# Patient Record
Sex: Female | Born: 1975 | Race: White | Hispanic: No | Marital: Married | Smoking: Never smoker
Health system: Southern US, Community
[De-identification: ages and names within clinical notes are randomized; demographics above are authoritative.]

## PROBLEM LIST (undated history)

## (undated) DIAGNOSIS — I10 Essential (primary) hypertension: Secondary | ICD-10-CM

## (undated) DIAGNOSIS — T7840XA Allergy, unspecified, initial encounter: Secondary | ICD-10-CM

## (undated) HISTORY — DX: Essential (primary) hypertension: I10

## (undated) HISTORY — PX: HERNIA REPAIR: SHX51

## (undated) HISTORY — PX: CHOLECYSTECTOMY: SHX55

## (undated) HISTORY — DX: Allergy, unspecified, initial encounter: T78.40XA

---
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
## 2006-07-08 ENCOUNTER — Ambulatory Visit: Payer: Self-pay | Admitting: Internal Medicine

## 2006-09-08 ENCOUNTER — Ambulatory Visit: Payer: Self-pay | Admitting: Gastroenterology

## 2006-11-13 ENCOUNTER — Ambulatory Visit: Payer: Self-pay | Admitting: Gastroenterology

## 2007-01-21 ENCOUNTER — Ambulatory Visit: Payer: Self-pay | Admitting: Gastroenterology

## 2007-08-06 ENCOUNTER — Ambulatory Visit: Payer: Self-pay | Admitting: Family Medicine

## 2008-07-07 ENCOUNTER — Ambulatory Visit: Payer: Self-pay | Admitting: Gastroenterology

## 2009-05-15 ENCOUNTER — Ambulatory Visit: Payer: Self-pay | Admitting: Internal Medicine

## 2009-10-23 ENCOUNTER — Ambulatory Visit: Payer: Self-pay | Admitting: Internal Medicine

## 2009-12-26 ENCOUNTER — Ambulatory Visit: Payer: Self-pay | Admitting: Family Medicine

## 2011-03-30 HISTORY — PX: SHOULDER SURGERY: SHX246

## 2011-04-14 ENCOUNTER — Ambulatory Visit: Payer: Self-pay | Admitting: Internal Medicine

## 2011-05-27 ENCOUNTER — Ambulatory Visit: Payer: Self-pay | Admitting: Internal Medicine

## 2012-01-15 ENCOUNTER — Ambulatory Visit: Payer: Self-pay | Admitting: Unknown Physician Specialty

## 2012-01-15 LAB — HCG, QUANTITATIVE, PREGNANCY: Beta Hcg, Quant.: <1 m[IU]/mL — ABNORMAL LOW

## 2012-01-19 ENCOUNTER — Ambulatory Visit: Payer: Self-pay | Admitting: Unknown Physician Specialty

## 2012-03-29 ENCOUNTER — Ambulatory Visit: Payer: Self-pay | Admitting: Unknown Physician Specialty

## 2012-05-17 ENCOUNTER — Ambulatory Visit: Payer: Self-pay | Admitting: Internal Medicine

## 2012-09-07 ENCOUNTER — Ambulatory Visit: Payer: Self-pay | Admitting: Internal Medicine

## 2012-09-13 ENCOUNTER — Telehealth: Payer: Self-pay | Admitting: Internal Medicine

## 2012-09-13 NOTE — Telephone Encounter (Signed)
Patient needing an appointment now her depo shot is over due and something going on with her head.

## 2012-09-14 NOTE — Telephone Encounter (Signed)
I am only going to be here a 1/2 day tomorrow - unable to work in [employer].  I can see her on 09/16/12 at 10:00.  If she is unable to wait - can go to acute care and then I can follow up after.  Thanks.

## 2012-09-14 NOTE — Telephone Encounter (Signed)
See if she can come in at 12:00 today.  I spoke to pt last night and said I would have to find a time.  She said to call her mobile.

## 2012-09-14 NOTE — Telephone Encounter (Signed)
Pt is unavailable can she come tomorrow Any time

## 2012-09-15 ENCOUNTER — Encounter: Payer: Self-pay | Admitting: Internal Medicine

## 2012-09-15 NOTE — Telephone Encounter (Signed)
Pt aware of appointment 

## 2012-09-16 ENCOUNTER — Encounter: Payer: Self-pay | Admitting: Internal Medicine

## 2012-09-16 ENCOUNTER — Telehealth: Payer: Self-pay | Admitting: *Deleted

## 2012-09-16 ENCOUNTER — Ambulatory Visit (INDEPENDENT_AMBULATORY_CARE_PROVIDER_SITE_OTHER): Payer: Commercial Managed Care - PPO | Admitting: Internal Medicine

## 2012-09-16 VITALS — BP 149/91 | HR 88 | Temp 99.1°F | Ht 68.0 in | Wt 294.0 lb

## 2012-09-16 DIAGNOSIS — Z309 Encounter for contraceptive management, unspecified: Secondary | ICD-10-CM

## 2012-09-16 DIAGNOSIS — N959 Unspecified menopausal and perimenopausal disorder: Secondary | ICD-10-CM

## 2012-09-16 DIAGNOSIS — N926 Irregular menstruation, unspecified: Secondary | ICD-10-CM

## 2012-09-16 DIAGNOSIS — Z0000 Encounter for general adult medical examination without abnormal findings: Secondary | ICD-10-CM

## 2012-09-16 DIAGNOSIS — N939 Abnormal uterine and vaginal bleeding, unspecified: Secondary | ICD-10-CM

## 2012-09-16 MED ORDER — SERTRALINE HCL 25 MG PO TABS: 25.0000 mg | ORAL_TABLET | Freq: Every day | ORAL | Status: DC

## 2012-09-16 MED ORDER — TRIAMCINOLONE ACETONIDE 0.1 % EX CREA: TOPICAL_CREAM | Freq: Two times a day (BID) | CUTANEOUS | Status: DC

## 2012-09-16 MED ORDER — AMOXICILLIN 875 MG PO TABS: 875.0000 mg | ORAL_TABLET | Freq: Two times a day (BID) | ORAL | Status: DC

## 2012-09-16 MED ORDER — MEDROXYPROGESTERONE ACETATE 150 MG/ML IM SUSP: 150.0000 mg | Freq: Once | INTRAMUSCULAR | Status: DC

## 2012-09-16 NOTE — Patient Instructions (Addendum)
It was nice seeing you today.  I am sorry you have not been feeling well.  Take the medicine as we discussed and keep me posted with progress.

## 2012-09-21 ENCOUNTER — Telehealth: Payer: Self-pay | Admitting: Internal Medicine

## 2012-09-21 DIAGNOSIS — Z139 Encounter for screening, unspecified: Secondary | ICD-10-CM

## 2012-09-21 DIAGNOSIS — R51 Headache: Secondary | ICD-10-CM

## 2012-09-21 DIAGNOSIS — R5383 Other fatigue: Secondary | ICD-10-CM

## 2012-09-21 NOTE — Telephone Encounter (Signed)
Pt would like a call back

## 2012-09-22 ENCOUNTER — Telehealth: Payer: Self-pay | Admitting: *Deleted

## 2012-09-22 DIAGNOSIS — IMO0001 Reserved for inherently not codable concepts without codable children: Secondary | ICD-10-CM

## 2012-09-22 MED ORDER — MEDROXYPROGESTERONE ACETATE 150 MG/ML IM SUSP: 150.0000 mg | Freq: Once | INTRAMUSCULAR | Status: DC

## 2012-09-22 MED ORDER — ALPRAZOLAM 0.25 MG PO TABS: 0.2500 mg | ORAL_TABLET | Freq: Once | ORAL | Status: DC

## 2012-09-22 NOTE — Telephone Encounter (Signed)
Patient stated that she is agreeable to labs and mri. I told her we would call her back and let her know when to come in.

## 2012-09-22 NOTE — Telephone Encounter (Signed)
I had discussed with her at the last visit that if her headache continued then we would need to schedule her for a mri (brain).  If agreeable let me know and i will schedule.  She will also need to come in for labs this week before mri can be done.

## 2012-09-22 NOTE — Telephone Encounter (Signed)
Head pain and full feeling still bothering her. She has taken 6 days antibiotic. There has been no change in her condition. Patient requests that we call her back today before 5:00 at work or cell number, to let her know what your reply is.

## 2012-09-22 NOTE — Telephone Encounter (Signed)
Please schedule her for an open mri - persistent headaches.  I have ordered a cr to be checked before her test. Thanks.

## 2012-09-22 NOTE — Telephone Encounter (Signed)
Encounter opened in error

## 2012-09-23 ENCOUNTER — Other Ambulatory Visit: Payer: Self-pay | Admitting: *Deleted

## 2012-09-23 NOTE — Telephone Encounter (Signed)
Prescription faxed to pharmacy.

## 2012-09-24 ENCOUNTER — Other Ambulatory Visit (INDEPENDENT_AMBULATORY_CARE_PROVIDER_SITE_OTHER): Payer: Commercial Managed Care - PPO

## 2012-09-24 DIAGNOSIS — Z139 Encounter for screening, unspecified: Secondary | ICD-10-CM

## 2012-09-24 DIAGNOSIS — R5383 Other fatigue: Secondary | ICD-10-CM

## 2012-09-24 DIAGNOSIS — R5381 Other malaise: Secondary | ICD-10-CM

## 2012-09-24 LAB — COMPREHENSIVE METABOLIC PANEL
AST: 21 U/L (ref 0–37)
Albumin: 4.2 g/dL (ref 3.5–5.2)
Alkaline Phosphatase: 59 U/L (ref 39–117)
BUN: 13 mg/dL (ref 6–23)
Potassium: 4.2 mEq/L (ref 3.5–5.1)
Total Bilirubin: 0.3 mg/dL (ref 0.3–1.2)

## 2012-09-24 LAB — LIPID PANEL
Cholesterol: 148 mg/dL (ref 0–200)
HDL: 43.2 mg/dL (ref >39.00)
LDL Cholesterol: 92 mg/dL (ref 0–99)
Triglycerides: 62 mg/dL (ref 0.0–149.0)
VLDL: 12.4 mg/dL (ref 0.0–40.0)

## 2012-09-27 ENCOUNTER — Encounter: Payer: Self-pay | Admitting: Internal Medicine

## 2012-09-27 ENCOUNTER — Telehealth: Payer: Self-pay | Admitting: Internal Medicine

## 2012-09-27 NOTE — Telephone Encounter (Signed)
Pt notified of labs via my chart.  Labs ok. Ok to proceed with mri

## 2012-09-27 NOTE — Progress Notes (Signed)
Subjective:    Patient ID: Erica Hernandez, female    DOB: [birth year], 36 y.o.   MRN: [id number]  HPI 36 year old female who presents as a work in with concerns regarding a persistent headache.  Went to Urgent Care one week ago.  Was diagnosed with a sinus/uri.  Treated with a Zpak.  States for two weeks, she has had a headache.  Involves the top of her head.  Ears uncomfortable.   Left ear - "boil pain".  Minimal nasal congestion now.  Sore throat and some cough. States the headache has been constant for two weeks.  No vision change.  She is off her Zoloft.  Feels she may need to be back on the medication.  No significant depression symptoms.  She does have a localized rash - posterior neck.  Itches.  Appears to be consistent with a contact dermatitis.  No other rash.  No nausea or vomiting.  Eating and drinking well.     Review of Systems Patient denies any lightheadedness or dizziness.  Reports the headache as outlined.   Unsure if related to sinus.  Some minimal nasal congestion and sore throat.  Minimal cough.  Has recently bee treated with abx.  Question if partially treated sinus infection.   No chest pain, tightness or palpitations.  No increased shortness of breath.  No nausea or vomiting.  No abdominal pain or cramping.  No bowel change, such as diarrhea, constipation, BRBPR or melana.  No urine change.        Objective:   Physical Exam Filed Vitals:   09/16/12 1024  BP: 149/91  Pulse: 88  Temp: 99.1 F (37.3 C)   Blood pressure recheck:  36/55  36 year old female in no acute distress.   HEENT:  Nares - clear except slightly erythematous turbinates.  No tenderness to palpation over the temporal artery region.  OP- without lesions or erythema.  PEERL.  Fundi visualized appeared to be benign.  NECK:  Supple, nontender.  No audible bruit.   HEART:  Appears to be regular. LUNGS:  Without crackles or wheezing audible.  Respirations even and unlabored.   RADIAL PULSE:  Equal  bilaterally.  ABDOMEN:  Soft, nontender.  No audible abdominal bruit.   EXTREMITIES:  No increased edema to be present.              SKIN:  Localized rash post neck - appears to be consistent with a contact dermatitis.  No other rash        Assessment & Plan:  HEADACHE.  Unclear as to the exact etiology.  May be partially treated sinus infection. On abx last week.  Will treat with ceftin 250mg  bid.  Saline nasal flushes as directed.  Follow.  Discussed with her regarding further w/up, including MRI (brain) - given persistent and new headache.  She wants to try the abx first and see if headache resolved.  Will let me know if persistent problems.  Will check ESR, CRP and cbc.   INCREASED PSYCHOSOCIAL STRESSORS.  Will get her back on Zoloft.  Restart 25mg  qd.  Follow.  May help above.    RASH.  Appears to be consistent with a contact dermatitis.  Localized to her posterior neck.  Apply triamcinolone cream .1% as directed.  Follow.  Notify me if rash does not resolve.  Do not use the cream in the same area for more than 7-10 days.    ELEVATED BLOOD PRESSURE.  Blood pressure on my  check (with appropriate cuff) - wnl.  Follow.

## 2012-10-08 ENCOUNTER — Telehealth: Payer: Self-pay | Admitting: Internal Medicine

## 2012-10-08 NOTE — Telephone Encounter (Signed)
Called patient to let her know. Patient stated that she would wait and talk to you about on her next appt 10/28/12 9:00 am.

## 2012-10-08 NOTE — Telephone Encounter (Signed)
error 

## 2012-10-08 NOTE — Telephone Encounter (Signed)
Notify pt mri ok.  If persistent headache will need neurology referral.  Let me know and i can order referral.

## 2012-10-21 ENCOUNTER — Encounter: Payer: Self-pay | Admitting: Internal Medicine

## 2012-10-28 ENCOUNTER — Encounter: Payer: Self-pay | Admitting: Internal Medicine

## 2012-10-28 ENCOUNTER — Ambulatory Visit (INDEPENDENT_AMBULATORY_CARE_PROVIDER_SITE_OTHER): Payer: Commercial Managed Care - PPO | Admitting: Internal Medicine

## 2012-10-28 VITALS — BP 138/88 | HR 80 | Temp 98.8°F | Ht 68.0 in | Wt 296.5 lb

## 2012-10-28 DIAGNOSIS — R51 Headache: Secondary | ICD-10-CM

## 2012-10-31 ENCOUNTER — Encounter: Payer: Self-pay | Admitting: Internal Medicine

## 2012-10-31 MED ORDER — SERTRALINE HCL 50 MG PO TABS: 50.0000 mg | ORAL_TABLET | Freq: Every day | ORAL | Status: DC

## 2012-10-31 NOTE — Progress Notes (Signed)
  Subjective:    Patient ID: Erica Hernandez, female    DOB: [birth year], 36 y.o.   MRN: [id number]  HPI 36 year old female who presents for a scheduled follow up with concerns regarding a persistent headache.  Went to Urgent Care initially.  Was diagnosed with a sinus/uri.  Treated with a Zpak.  Saw me in follow up.  Some improvement in some congestion symptoms, but states the headache persisted.  Was then treated with Amoxicillin (to confirm not a partially treated infection).  Not improvement in her headache.  No sinus congestion or drainage.  Describes the sensation as a head fullness.  States since her last visit here, she has had two "bad headaches".  Took her husband's zanaflex and slept with noted improvement.  No vision change.  No nausea or vomiting.  Eating and drinking well.  No neck pain or stiffness.     Review of Systems Patient denies any lightheadedness or dizziness.  Reports the headache as outlined.   No significant sinus or allergy symptoms.  MRI - negative.  No chest pain, tightness or palpitations.  No increased shortness of breath.  No nausea or vomiting.  No abdominal pain or cramping.  No bowel change, such as diarrhea, constipation, BRBPR or melana.          Objective:   Physical Exam  Filed Vitals:   10/28/12 0905  BP: 138/88  Pulse: 80  Temp: 98.8 F (37.1 C)   Blood pressure recheck:  24/65  36 year old female in no acute distress.   HEENT:  Nares - clear.   No tenderness to palpation over the sinuses.  OP- without lesions or erythema.  TMs visualized without erythema.  NECK:  Supple, nontender.  No audible bruit.   HEART:  Appears to be regular. LUNGS:  Without crackles or wheezing audible.  Respirations even and unlabored.   RADIAL PULSE:  Equal bilaterally.  ABDOMEN:  Soft, nontender.  No audible abdominal bruit.   EXTREMITIES:  No increased edema to be present.        Assessment & Plan:  HEADACHE.  Unclear as to the exact etiology.  Previously treated  for a sinus infection.  Made no difference in the headache.  No auras.  No associated nausea or vomiting.  Took  Her husband's zanaflex and slept - with noted improvement.  MRI negative.  Will refer to neurology for evaluation and further treatment recommendations.   GYN.  Receiving Depo shots q 3 months.  Continue.  Continue calcium supplements.    INCREASED PSYCHOSOCIAL STRESSORS.  Back on Zoloft.  Will increase to 50mg  q day.  Follow.   ELEVATED BLOOD PRESSURE.  Blood pressure on my check (with appropriate cuff) - wnl.  Follow.   HEALTH MAINTENANCE.  Physical 11/26/11.  Will set her up for a physical next visit.  Follow.

## 2012-12-06 ENCOUNTER — Ambulatory Visit (INDEPENDENT_AMBULATORY_CARE_PROVIDER_SITE_OTHER): Payer: Commercial Managed Care - PPO | Admitting: Internal Medicine

## 2012-12-06 ENCOUNTER — Other Ambulatory Visit (HOSPITAL_COMMUNITY)
Admission: RE | Admit: 2012-12-06 | Discharge: 2012-12-06 | Disposition: A | Discharge status: Home / Self Care | Payer: Commercial Managed Care - PPO | Source: Ambulatory Visit | Attending: Internal Medicine | Admitting: Internal Medicine

## 2012-12-06 ENCOUNTER — Encounter: Payer: Self-pay | Admitting: Internal Medicine

## 2012-12-06 VITALS — BP 120/70 | HR 85 | Temp 99.0°F | Ht 68.0 in | Wt 290.5 lb

## 2012-12-06 DIAGNOSIS — Z139 Encounter for screening, unspecified: Secondary | ICD-10-CM

## 2012-12-06 DIAGNOSIS — Z309 Encounter for contraceptive management, unspecified: Secondary | ICD-10-CM

## 2012-12-06 DIAGNOSIS — Z01419 Encounter for gynecological examination (general) (routine) without abnormal findings: Secondary | ICD-10-CM | POA: Insufficient documentation

## 2012-12-06 DIAGNOSIS — IMO0001 Reserved for inherently not codable concepts without codable children: Secondary | ICD-10-CM

## 2012-12-06 DIAGNOSIS — Z1151 Encounter for screening for human papillomavirus (HPV): Secondary | ICD-10-CM | POA: Insufficient documentation

## 2012-12-06 MED ORDER — METHYLPREDNISOLONE ACETATE 40 MG/ML IJ SUSP
150.0000 mg | Freq: Once | INTRAMUSCULAR | Status: AC
  Administered 2012-12-06: 150 mg via INTRAMUSCULAR

## 2012-12-06 NOTE — Progress Notes (Signed)
Subjective:    Patient ID: Erica Hernandez, female    DOB: May 25, 1976, 37 y.o.   MRN: [id number]  HPI 37 year old female who comes in today for her complete physical exam.  Still with headaches.  See last note for details.  Had negative MRI.  Saw neurology.  Obtain records.  Wanted to start her on nortriptyline.  She did not and does not want to start this medication.  Some discomfort (minimal) occurring most days.  Steroids made no difference.  abx did not help.  She does grind her teeth.  She is also having some tooth issues.  Plans to follow up with her dentist regarding the above.  She has started back to weight watchers.  Has lost 6 pounds since her last visit.  Doing well on the zoloft.    Past Medical History  Diagnosis Date  . Hypertension     during pregnancy     Current Outpatient Prescriptions on File Prior to Visit  Medication Sig Dispense Refill  . ALPRAZolam (XANAX) 0.25 MG tablet Take 1 tablet (0.25 mg total) by mouth once. Take before mri.  May repeat x 1 if needed  2 tablet  0  . medroxyPROGESTERone (DEPO-PROVERA) 150 MG/ML injection Inject 150 mg into the muscle every 3 (three) months.      . sertraline (ZOLOFT) 50 MG tablet Take 1 tablet (50 mg total) by mouth daily.  30 tablet  3  . triamcinolone cream (KENALOG) 0.1 % Apply topically 2 (two) times daily.  30 g  0    Review of Systems Patient denies any lightheadedness or dizziness.  Reports the headache as outlined.   No significant sinus or allergy symptoms.  MRI - negative.  No chest pain, tightness or palpitations.  No increased shortness of breath.  No nausea or vomiting.  No abdominal pain or cramping.  No bowel change, such as diarrhea, constipation, BRBPR or melana.          Objective:   Physical Exam  Filed Vitals:   12/06/12 1016  BP: 120/70  Pulse: 85  Temp: 99 F (49.52 C)   37 year old female in no acute distress.   HEENT:  Nares- clear.  Oropharynx - without lesions. NECK:  Supple.  Nontender.  No  audible bruit.  HEART:  Appears to be regular. LUNGS:  No crackles or wheezing audible.  Respirations even and unlabored.  RADIAL PULSE:  Equal bilaterally.    BREASTS:  No nipple discharge or nipple retraction present.  Could not appreciate any distinct nodules or axillary adenopathy.  ABDOMEN:  Soft, nontender.  Bowel sounds present and normal.  No audible abdominal bruit.  GU:  Normal external genitalia.  Vaginal vault without lesions.  Cervix identified.  Pap performed. Could not appreciate any adnexal masses or tenderness.   EXTREMITIES:  No increased edema present.  DP pulses palpable and equal bilaterally.         Assessment & Plan:  HEADACHE.  Unclear as to the exact etiology.  Previously treated for a sinus infection.  Made no difference in the headache.  No auras.  No associated nausea or vomiting.  Took  Her husband's zanaflex and slept - with noted improvement.  MRI negative.  Saw neurology.  Obtain records.  See above.  Desires not to take the nortriptyline.  Discussed referral back to neurology.  Discussed keeping appt with the dentist and treating her teeth as well as a possible bite block.  She desires no further  intervention at this time.  Follow.    GYN.  Receiving Depo shots q 3 months.  Continue.  Continue calcium supplements.    INCREASED PSYCHOSOCIAL STRESSORS.  On zoloft 50mg  q day.  Doing well.  Follow.   ELEVATED BLOOD PRESSURE.  Blood pressure today - wnl.    HEALTH MAINTENANCE.  Physical today.  Pap today.

## 2012-12-07 ENCOUNTER — Encounter: Payer: Self-pay | Admitting: Internal Medicine

## 2012-12-08 ENCOUNTER — Encounter: Payer: Self-pay | Admitting: *Deleted

## 2012-12-25 ENCOUNTER — Other Ambulatory Visit: Payer: Self-pay

## 2013-01-02 ENCOUNTER — Ambulatory Visit: Payer: Self-pay | Admitting: Emergency Medicine

## 2013-01-02 LAB — RAPID STREP-A WITH REFLX: Micro Text Report: NEGATIVE

## 2013-01-04 LAB — BETA STREP CULTURE(ARMC)

## 2013-02-07 ENCOUNTER — Ambulatory Visit: Payer: Self-pay | Admitting: Emergency Medicine

## 2013-02-07 LAB — URINALYSIS, COMPLETE
Bacteria: NEGATIVE
Bilirubin,UR: NEGATIVE
Glucose,UR: NEGATIVE mg/dL (ref 0–75)
Ketone: NEGATIVE
Leukocyte Esterase: NEGATIVE
Nitrite: NEGATIVE
Ph: 7 (ref 4.5–8.0)
Protein: NEGATIVE
RBC,UR: NONE SEEN /HPF (ref 0–5)
Specific Gravity: 1.01 (ref 1.003–1.030)

## 2013-02-07 LAB — PREGNANCY, URINE: Pregnancy Test, Urine: NEGATIVE m[IU]/mL

## 2013-03-01 ENCOUNTER — Other Ambulatory Visit: Payer: Self-pay | Admitting: *Deleted

## 2013-03-02 ENCOUNTER — Ambulatory Visit (INDEPENDENT_AMBULATORY_CARE_PROVIDER_SITE_OTHER): Payer: Commercial Managed Care - PPO | Admitting: Internal Medicine

## 2013-03-02 ENCOUNTER — Encounter: Payer: Self-pay | Admitting: Internal Medicine

## 2013-03-02 VITALS — BP 120/70 | HR 80 | Temp 98.7°F | Ht 68.0 in | Wt 309.0 lb

## 2013-03-02 DIAGNOSIS — IMO0001 Reserved for inherently not codable concepts without codable children: Secondary | ICD-10-CM

## 2013-03-02 DIAGNOSIS — Z309 Encounter for contraceptive management, unspecified: Secondary | ICD-10-CM

## 2013-03-02 MED ORDER — MEDROXYPROGESTERONE ACETATE 150 MG/ML IM SUSP
150.0000 mg | Freq: Once | INTRAMUSCULAR | Status: AC
  Administered 2013-03-02: 150 mg via INTRAMUSCULAR

## 2013-03-06 ENCOUNTER — Encounter: Payer: Self-pay | Admitting: Internal Medicine

## 2013-03-06 NOTE — Progress Notes (Signed)
  Subjective:    Patient ID: Erica Hernandez, female    DOB: [birth year], 37 y.o.   MRN: [id number]  HPI 37 year old female who comes in today for a scheduled follow up.  States that on 02/07/13 got up and was experiencing chest and arm discomfort.  Went to urgent care.  Diagnosed with costochondritis.  Has been taking mobic.  Has been much improved.  Notices a little flare this am.  No sob. No pain with deep breathing.  No acid reflux.  No abdominal pain.  Bowels stable.  Doing well on the zoloft.  Handling stress well.     Past Medical History  Diagnosis Date  . Hypertension     during pregnancy     Current Outpatient Prescriptions on File Prior to Visit  Medication Sig Dispense Refill  . medroxyPROGESTERone (DEPO-PROVERA) 150 MG/ML injection Inject 150 mg into the muscle every 3 (three) months.      . sertraline (ZOLOFT) 50 MG tablet Take 1 tablet (50 mg total) by mouth daily.  30 tablet  3  . triamcinolone cream (KENALOG) 0.1 % Apply topically 2 (two) times daily.  30 g  0   No current facility-administered medications on file prior to visit.    Review of Systems Patient denies any lightheadedness or dizziness.  Previously worked up for headache.  MRI negative.  Appears to be better.  No significant sinus or allergy symptoms.  Some check discomfort.  Reproducible to palpation.  No sob.  No palpitations.  No nausea or vomiting.  No acid reflux.  No abdominal pain or cramping.  No bowel change, such as diarrhea, constipation, BRBPR or melana.  Handling stress well.         Objective:   Physical Exam  Filed Vitals:   03/02/13 0804  BP: 120/70  Pulse: 80  Temp: 98.7 F (37.1 C)   Blood pressure recheck:  122/82, pulse 52  37 year old female in no acute distress.   HEENT:  Nares- clear.  Oropharynx - without lesions. NECK:  Supple.  Nontender.  No audible bruit.  HEART:  Appears to be regular. LUNGS:  No crackles or wheezing audible.  Respirations even and unlabored.  RADIAL  PULSE:  Equal bilaterally.    CHEST WALL.  Pain to palpation over the sternum.  (reproducible pain).   ABDOMEN:  Soft, nontender.  Bowel sounds present and normal.  No audible abdominal bruit.  EXTREMITIES:  No increased edema present.       Assessment & Plan:  CHEST PAIN.  Reproducible on exam.  Diagnosed with costochondritis.  On mobic.  Improved.  Follow.  Notify me if persistent.   HEADACHE.  Worked up.  Improved.  Follow.    GYN.  Receiving Depo shots q 3 months.  Continue.  Continue calcium supplements.    INCREASED PSYCHOSOCIAL STRESSORS.  On zoloft 50mg  q day.  Doing well.  Follow.   ELEVATED BLOOD PRESSURE.  Blood pressure today - wnl.    HEALTH MAINTENANCE.  Physical last visit.

## 2013-03-07 ENCOUNTER — Ambulatory Visit: Payer: Commercial Managed Care - PPO | Admitting: Internal Medicine

## 2013-04-08 ENCOUNTER — Encounter: Payer: Self-pay | Admitting: Internal Medicine

## 2013-05-24 ENCOUNTER — Ambulatory Visit (INDEPENDENT_AMBULATORY_CARE_PROVIDER_SITE_OTHER): Payer: Commercial Managed Care - PPO | Admitting: Internal Medicine

## 2013-05-24 ENCOUNTER — Encounter: Payer: Self-pay | Admitting: Internal Medicine

## 2013-05-24 VITALS — BP 120/80 | HR 72 | Temp 98.9°F | Ht 68.0 in | Wt 302.2 lb

## 2013-05-24 DIAGNOSIS — R5381 Other malaise: Secondary | ICD-10-CM

## 2013-05-24 DIAGNOSIS — R5383 Other fatigue: Secondary | ICD-10-CM

## 2013-05-24 DIAGNOSIS — Z309 Encounter for contraceptive management, unspecified: Secondary | ICD-10-CM

## 2013-05-24 LAB — CBC WITH DIFFERENTIAL/PLATELET
Eosinophils Relative: 2.2 % (ref 0.0–5.0)
HCT: 39.2 % (ref 36.0–46.0)
Hemoglobin: 13.2 g/dL (ref 12.0–15.0)
Lymphs Abs: 2.1 10*3/uL (ref 0.7–4.0)
MCV: 87.3 fl (ref 78.0–100.0)
Monocytes Absolute: 0.4 10*3/uL (ref 0.1–1.0)
Monocytes Relative: 5.5 % (ref 3.0–12.0)
Neutro Abs: 4.5 10*3/uL (ref 1.4–7.7)
Neutrophils Relative %: 63 % (ref 43.0–77.0)
RDW: 15 % — ABNORMAL HIGH (ref 11.5–14.6)
WBC: 7.2 10*3/uL (ref 4.5–10.5)

## 2013-05-24 LAB — COMPREHENSIVE METABOLIC PANEL
CO2: 26 mEq/L (ref 19–32)
Calcium: 9.3 mg/dL (ref 8.4–10.5)
Chloride: 108 mEq/L (ref 96–112)
Creatinine, Ser: 0.9 mg/dL (ref 0.4–1.2)
GFR: 72.89 mL/min (ref >60.00)
Glucose, Bld: 96 mg/dL (ref 70–99)
Total Bilirubin: 0.5 mg/dL (ref 0.3–1.2)
Total Protein: 7.3 g/dL (ref 6.0–8.3)

## 2013-05-24 LAB — TSH: TSH: 1.49 u[IU]/mL (ref 0.35–5.50)

## 2013-05-24 MED ORDER — SERTRALINE HCL 50 MG PO TABS: ORAL_TABLET | ORAL | Status: DC

## 2013-05-24 MED ORDER — MEDROXYPROGESTERONE ACETATE 150 MG/ML IM SUSP
150.0000 mg | Freq: Once | INTRAMUSCULAR | Status: AC
  Administered 2013-05-24: 150 mg via INTRAMUSCULAR

## 2013-05-26 NOTE — Telephone Encounter (Signed)
Mailed unread message to pt  

## 2013-05-28 ENCOUNTER — Encounter: Payer: Self-pay | Admitting: Internal Medicine

## 2013-05-28 NOTE — Progress Notes (Signed)
  Subjective:    Patient ID: Erica Hernandez, female    DOB: 1976/06/17, 37 y.o.   MRN: [id number]  HPI 37 year old female who comes in today for a scheduled follow up.  She states she is doing relatively well.  On zoloft.  Feels she may need to increase the dose.  Increased stress at work and with some family issues.  She also reports noticing some rectal bleeding - with a bowel movement.  Occasionally notices with straining.  Will feel a sharp pain with bowel movement at times.  She has started weight watchers.  Drinking more water.  Has been using some preparation H wipes.  Has some occasional diarrhea.  No abdominal pain.  Has a maternal uncle with Crohns.  No other family members with colon issues.      Past Medical History  Diagnosis Date  . Hypertension     during pregnancy     Current Outpatient Prescriptions on File Prior to Visit  Medication Sig Dispense Refill  . medroxyPROGESTERone (DEPO-PROVERA) 150 MG/ML injection Inject 150 mg into the muscle every 3 (three) months.      . meloxicam (MOBIC) 15 MG tablet Take 15 mg by mouth as needed.       . triamcinolone cream (KENALOG) 0.1 % Apply topically 2 (two) times daily.  30 g  0   No current facility-administered medications on file prior to visit.    Review of Systems Patient denies any lightheadedness or dizziness.  Previously worked up for headache.  MRI negative.  Appears to be better.  No significant sinus or allergy symptoms.  No chest pain or tightness with palpation.   No sob.  No palpitations.  No nausea or vomiting.  No acid reflux.  No abdominal pain or cramping.  Some occasional diarrhea.  Rectal bleeding as outlined.  Will notice on the tissue and in the toilet.         Objective:   Physical Exam  Filed Vitals:   05/24/13 0759  BP: 120/80  Pulse: 72  Temp: 98.9 F (45.4 C)   37 year old female in no acute distress.   HEENT:  Nares- clear.  Oropharynx - without lesions. NECK:  Supple.  Nontender.  No audible  bruit.  HEART:  Appears to be regular. LUNGS:  No crackles or wheezing audible.  Respirations even and unlabored.  RADIAL PULSE:  Equal bilaterally.     ABDOMEN:  Soft, nontender.  Bowel sounds present and normal.  No audible abdominal bruit.  RECTAL:  No external hemorrhoids noted.  No mass or lesion felt on rectal exam.  Heme negative.  EXTREMITIES:  No increased edema present.       Assessment & Plan:  RECTAL BLEEDING.  Exam as outlined.  Annusol HC suppositories as directed.  Follow.  Get her back in soon to reassess.  Check cbc.   FATIGUE.  Check cbc, met c and tsh.   HEADACHE.  Worked up.  Improved.  Follow.    GYN.  Receiving Depo shots q 3 months.  Continue.  Continue calcium supplements.    INCREASED PSYCHOSOCIAL STRESSORS.  On zoloft 50mg  q day.  Increased stress.  Feels needs to increase the dose.  Will increase to 50mg  1 1/2 tablet q day.  Follow.  Get her back in soon to reassess.    ELEVATED BLOOD PRESSURE.  Blood pressure today - wnl.    HEALTH MAINTENANCE.  Physical 12/06/12.  Cholesterol panel 09/24/12 wnl.

## 2013-06-30 ENCOUNTER — Ambulatory Visit: Payer: Commercial Managed Care - PPO | Admitting: Internal Medicine

## 2013-07-01 ENCOUNTER — Ambulatory Visit: Payer: Self-pay | Admitting: Emergency Medicine

## 2013-07-04 ENCOUNTER — Encounter: Payer: Self-pay | Admitting: Internal Medicine

## 2013-07-04 ENCOUNTER — Ambulatory Visit (INDEPENDENT_AMBULATORY_CARE_PROVIDER_SITE_OTHER): Payer: Commercial Managed Care - PPO | Admitting: Internal Medicine

## 2013-07-04 VITALS — BP 110/70 | HR 89 | Temp 98.6°F | Ht 68.0 in | Wt 302.0 lb

## 2013-07-04 DIAGNOSIS — R51 Headache: Secondary | ICD-10-CM

## 2013-07-04 MED ORDER — AMOXICILLIN 875 MG PO TABS: 875.0000 mg | ORAL_TABLET | Freq: Two times a day (BID) | ORAL | Status: DC

## 2013-07-04 NOTE — Patient Instructions (Addendum)
Saline nasal spray - flush nose at least 2-3x/day.  Continue the Flonase.  Mucinex in the am and Robitussin in the evening.

## 2013-07-05 ENCOUNTER — Encounter: Payer: Self-pay | Admitting: Internal Medicine

## 2013-07-05 NOTE — Progress Notes (Signed)
  Subjective:    Patient ID: Erica Hernandez, female    DOB: [birth year], 37 y.o.   MRN: [id number]  Sinusitis  37 year old female who comes in today as a work in with concerns regarding some increased head pressure.  She states symptoms started 10 days ago.  Has been having some head pressure.  Has noticed this previously.  Had extensive w/up including MRI and neurology evaluation.  No etiology found.  Pressure resolved.  Returned as outlined.  States mostly localized to the top of her head and frontal region.  States it feels like a sinus infection and she needs to "blow congestion out of her head".  No severe pain.  No neck stiffness.  Low grade fever (99's - max).  Has been taking sudafed and zyrtec.  Used flonase for several days.  No nausea or vomiting.  Eating and drinking well.      Past Medical History  Diagnosis Date  . Hypertension     during pregnancy     Current Outpatient Prescriptions on File Prior to Visit  Medication Sig Dispense Refill  . medroxyPROGESTERone (DEPO-PROVERA) 150 MG/ML injection Inject 150 mg into the muscle every 3 (three) months.      . meloxicam (MOBIC) 15 MG tablet Take 15 mg by mouth as needed.       . sertraline (ZOLOFT) 50 MG tablet Take 1 1/2 tablet q day  45 tablet  3  . triamcinolone cream (KENALOG) 0.1 % Apply topically 2 (two) times daily.  30 g  0   No current facility-administered medications on file prior to visit.    Review of Systems Patient denies any lightheadedness or dizziness.  Previously worked up for headache.  MRI negative.  Now with some head pressure.  Symptoms as outlined.  Low grade fever.  No neck stiffness.  No nausea or vomiting.  Eating and drinking well.  No bowel change.        Objective:   Physical Exam  Filed Vitals:   07/04/13 1632  BP: 110/70  Pulse: 89  Temp: 98.6 F (70 C)   37 year old female in no acute distress.   HEENT:  Nares- clear except for slightly erythematous turbinates.  Oropharynx - without  lesions.  TMs - clear, no erythema.  Minimal tenderness to palpation over the sinuses.  NECK:  Supple.  Nontender.   HEART:  Appears to be regular. LUNGS:  No crackles or wheezing audible.  Respirations even and unlabored.     Assessment & Plan:  HEAD PRESSURE.  Symptoms and exam as outlined.  Had previous MRI and neurology w/up.  She states it feels like a sinus infection.  Given persistent symptoms and no response to sudafed and zyrtec, will treat with amoxicillin 875mg  bid x 10 days.  Stop the zyrtec and sudafed.  Robitussin/mucinex as directed.  Continue flonase.  Use saline nasal spray as directed.  Discussed if symptoms change, worsen or did not resolve - she was to be reevaluated.  Discussed further w/up now.  Wants to try the above treatment measures first.     GYN.  Receiving Depo shots q 3 months.  Continue.  Continue calcium supplements.      ELEVATED BLOOD PRESSURE.  Blood pressure today - wnl.    HEALTH MAINTENANCE.  Physical 12/06/12.  Cholesterol panel 09/24/12 wnl.

## 2013-07-13 ENCOUNTER — Ambulatory Visit (INDEPENDENT_AMBULATORY_CARE_PROVIDER_SITE_OTHER): Payer: Commercial Managed Care - PPO | Admitting: Internal Medicine

## 2013-07-13 ENCOUNTER — Encounter: Payer: Self-pay | Admitting: Internal Medicine

## 2013-07-13 VITALS — BP 120/84 | HR 67 | Temp 98.3°F | Ht 68.0 in | Wt 302.5 lb

## 2013-07-13 DIAGNOSIS — R51 Headache: Secondary | ICD-10-CM

## 2013-07-13 MED ORDER — LEVOFLOXACIN 500 MG PO TABS: 500.0000 mg | ORAL_TABLET | Freq: Every day | ORAL | Status: DC

## 2013-07-16 ENCOUNTER — Encounter: Payer: Self-pay | Admitting: Internal Medicine

## 2013-07-16 NOTE — Progress Notes (Signed)
Subjective:    Patient ID: Erica Hernandez, female    DOB: [birth year], 37 y.o.   MRN: [id number]  HPI 37 year old female who comes in today for a scheduled follow up.  She was seen here on 07/04/13 for headache and ear fullness.  See that note for details.  Was placed on antibiotics.  Unclear if the etiology was sinus infection.  She states that her symptoms have improved some.  Still with some headache.  Also describes some left ear discomfort.  Feels better today.  Previously noted a rash on right side of her throat.  No sore throat.  Some maxillary sinus tenderness.  No nausea or vomiting.  Eating and drinking well.  Bowels stable.  Feels she is handling stress relatively well.      Past Medical History  Diagnosis Date  . Hypertension     during pregnancy     Current Outpatient Prescriptions on File Prior to Visit  Medication Sig Dispense Refill  . amoxicillin (AMOXIL) 875 MG tablet Take 1 tablet (875 mg total) by mouth 2 (two) times daily.  20 tablet  0  . cetirizine (ZYRTEC) 10 MG tablet Take 10 mg by mouth daily.      . fluticasone (FLONASE) 50 MCG/ACT nasal spray Place 2 sprays into the nose daily.      . medroxyPROGESTERone (DEPO-PROVERA) 150 MG/ML injection Inject 150 mg into the muscle every 3 (three) months.      . meloxicam (MOBIC) 15 MG tablet Take 15 mg by mouth as needed.       . sertraline (ZOLOFT) 50 MG tablet Take 1 1/2 tablet q day  45 tablet  3  . triamcinolone cream (KENALOG) 0.1 % Apply topically 2 (two) times daily.  30 g  0   No current facility-administered medications on file prior to visit.    Review of Systems Patient denies any lightheadedness or dizziness.  Does report headache as outlined.  Previously worked up for headache.  MRI negative.  Saw neurology.  Headaches subsided for a while, but now returned.  Left ear fullness as outlined.  No sore throat.   No chest pain, tightness or palpitations.   No sob.  No nausea or vomiting.  No acid reflux.  No abdominal  pain or cramping.  Bowels stable.           Objective:   Physical Exam  Filed Vitals:   07/13/13 1004  BP: 120/84  Pulse: 67  Temp: 98.3 F (36.8 C)   Blood pressure recheck:  118/72, pulse 30  37 year old female in no acute distress.   HEENT:  Nares- clear.  Oropharynx - without lesions. NECK:  Supple.  Nontender.  No audible bruit.  HEART:  Appears to be regular. LUNGS:  No crackles or wheezing audible.  Respirations even and unlabored.  RADIAL PULSE:  Equal bilaterally.     ABDOMEN:  Soft, nontender.  Bowel sounds present and normal.  No audible abdominal bruit.  RECTAL:  No external hemorrhoids noted.  No mass or lesion felt on rectal exam.  Heme negative.  EXTREMITIES:  No increased edema present.       Assessment & Plan:  HEADACHE.  Being treated for sinus infection with amoxicillin.  Feels some better, but having persistent symptoms.  Will extend abx, but change to levaquin 500mg  q day.  Saline nasal spray as outlined.  Follow.  Notify me if symptoms persist.  Has had extensive w/up for headaches previously.  GYN.  Receiving Depo shots q 3 months.  Continue.  Continue calcium supplements.    INCREASED PSYCHOSOCIAL STRESSORS.  On zoloft.  Doing well.  Follow.     ELEVATED BLOOD PRESSURE.  Blood pressure today - wnl.    HEALTH MAINTENANCE.  Physical 12/06/12.  Cholesterol panel 09/24/12 wnl.

## 2013-07-21 ENCOUNTER — Telehealth: Payer: Self-pay | Admitting: Internal Medicine

## 2013-07-21 DIAGNOSIS — R51 Headache: Secondary | ICD-10-CM

## 2013-07-21 NOTE — Telephone Encounter (Signed)
Notified pt. She states Dr. Lorin Picket had mentioned maybe seeing an ENT for her symptoms, wants to know if that would be an option. Does not want to see Dr. Malvin Johns again.

## 2013-07-21 NOTE — Telephone Encounter (Signed)
Pt is needing a call back. She says her head is not any better and she thinks it's time to do something else.

## 2013-07-21 NOTE — Telephone Encounter (Signed)
If having persistent headache - I would like to refer her back to neurology or headache clinic.  If she is agreeable to f/u with Dr Malvin Johns - can have Erica Hernandez call and schedule f/u appt soon.  Can refer to headache clinic or another neurology if needed - may take a while.

## 2013-07-21 NOTE — Telephone Encounter (Signed)
Order placed for ENT referral.   

## 2013-08-18 ENCOUNTER — Encounter: Payer: Self-pay | Admitting: Internal Medicine

## 2013-08-18 ENCOUNTER — Ambulatory Visit (INDEPENDENT_AMBULATORY_CARE_PROVIDER_SITE_OTHER): Payer: Commercial Managed Care - PPO | Admitting: Internal Medicine

## 2013-08-18 ENCOUNTER — Ambulatory Visit: Payer: Commercial Managed Care - PPO

## 2013-08-18 VITALS — BP 120/82 | HR 89 | Temp 98.1°F | Ht 68.0 in | Wt 312.5 lb

## 2013-08-18 DIAGNOSIS — IMO0001 Reserved for inherently not codable concepts without codable children: Secondary | ICD-10-CM

## 2013-08-18 DIAGNOSIS — Z309 Encounter for contraceptive management, unspecified: Secondary | ICD-10-CM

## 2013-08-18 DIAGNOSIS — Z23 Encounter for immunization: Secondary | ICD-10-CM

## 2013-08-18 MED ORDER — MEDROXYPROGESTERONE ACETATE 150 MG/ML IM SUSP
150.0000 mg | Freq: Once | INTRAMUSCULAR | Status: AC
  Administered 2013-08-18: 150 mg via INTRAMUSCULAR

## 2013-08-21 ENCOUNTER — Encounter: Payer: Self-pay | Admitting: Internal Medicine

## 2013-08-21 NOTE — Progress Notes (Signed)
  Subjective:    Patient ID: Erica Hernandez, female    DOB: [birth year], 37 y.o.   MRN: [id number]  HPI 37 year old female who comes in today for a scheduled follow up.  She was seen here on 07/04/13 for headache and ear fullness.  See that note for details.  Was placed on antibiotics.  No significant improvement.  Saw ENT.  Had allergy testing.  Placed on steroids.  Taking zyrtec and using flonase and saline nasal spray.  Still with persistent head pressure.No nausea or vomiting.  Eating and drinking well.  Bowels stable.  Feels she is handling stress relatively well.      Past Medical History  Diagnosis Date  . Hypertension     during pregnancy     Current Outpatient Prescriptions on File Prior to Visit  Medication Sig Dispense Refill  . cetirizine (ZYRTEC) 10 MG tablet Take 10 mg by mouth daily.      . fluticasone (FLONASE) 50 MCG/ACT nasal spray Place 2 sprays into the nose daily.      . medroxyPROGESTERone (DEPO-PROVERA) 150 MG/ML injection Inject 150 mg into the muscle every 3 (three) months.      . meloxicam (MOBIC) 15 MG tablet Take 15 mg by mouth as needed.       . sertraline (ZOLOFT) 50 MG tablet Take 1 1/2 tablet q day  45 tablet  3  . triamcinolone cream (KENALOG) 0.1 % Apply topically 2 (two) times daily.  30 g  0   No current facility-administered medications on file prior to visit.    Review of Systems Patient denies any lightheadedness or dizziness.  Does report headache as outlined.  Previously worked up for headache.  MRI negative.  Saw neurology.  Headaches subsided for a while, but now returned.  Saw ENT recently.  S/p steroids.  On zyrtec and flonase.  No sore throat.   No chest pain, tightness or palpitations.   No sob.  No nausea or vomiting.  No acid reflux.  No abdominal pain or cramping.  Bowels stable.           Objective:   Physical Exam  Filed Vitals:   08/18/13 1608  BP: 120/82  Pulse: 89  Temp: 98.1 F (68.65 C)   37 year old female in no acute  distress.   HEENT:  Nares- clear.  Oropharynx - without lesions. NECK:  Supple.  Nontender.  No audible bruit.  HEART:  Appears to be regular. LUNGS:  No crackles or wheezing audible.  Respirations even and unlabored.  RADIAL PULSE:  Equal bilaterally.     ABDOMEN:  Soft, nontender.  Bowel sounds present and normal.  No audible abdominal bruit.  EXTREMITIES:  No increased edema present.       Assessment & Plan:  HEADACHE.  Initially treated for sinus infection.  MRI negative.  Worked up by neurology.  Had allergy testing.  On zyrtec and flonase.  No significant change.  Discussed with her regarding seeing a headache specialist.  She desires no further intervention at this point.  Follow.    GYN.  Receiving Depo shots q 3 months.  Continue.     INCREASED PSYCHOSOCIAL STRESSORS.  On zoloft.  Doing well.  Follow.     ELEVATED BLOOD PRESSURE.  Blood pressure today - wnl.    HEALTH MAINTENANCE.  Physical 12/06/12.  Cholesterol panel 09/24/12 wnl.

## 2013-10-12 ENCOUNTER — Encounter: Payer: Self-pay | Admitting: Internal Medicine

## 2013-10-14 ENCOUNTER — Other Ambulatory Visit: Payer: Self-pay | Admitting: *Deleted

## 2013-10-14 MED ORDER — SERTRALINE HCL 50 MG PO TABS: ORAL_TABLET | ORAL | Status: DC

## 2013-10-18 NOTE — Telephone Encounter (Signed)
See myChart message, pt requesting refill Anucort-HC 25 mg rectal sup, not on her med list

## 2013-10-18 NOTE — Telephone Encounter (Signed)
I am ok to refill x 1, but I need to know what is giong on with her.  Is she having rectal bleeding, etc,.

## 2013-10-19 MED ORDER — HYDROCORTISONE ACETATE 25 MG RE SUPP: 25.0000 mg | Freq: Two times a day (BID) | RECTAL | Status: DC

## 2013-10-19 NOTE — Telephone Encounter (Signed)
Refilled annusol HC suppositroies #14 with no refills.

## 2013-11-14 ENCOUNTER — Ambulatory Visit (INDEPENDENT_AMBULATORY_CARE_PROVIDER_SITE_OTHER): Payer: Commercial Managed Care - PPO | Admitting: *Deleted

## 2013-11-14 DIAGNOSIS — Z309 Encounter for contraceptive management, unspecified: Secondary | ICD-10-CM

## 2013-11-14 MED ORDER — MEDROXYPROGESTERONE ACETATE 150 MG/ML IM SUSP
150.0000 mg | Freq: Once | INTRAMUSCULAR | Status: AC
  Administered 2013-11-14: 150 mg via INTRAMUSCULAR

## 2013-12-28 ENCOUNTER — Encounter: Payer: Commercial Managed Care - PPO | Admitting: Internal Medicine

## 2013-12-29 ENCOUNTER — Encounter: Payer: Commercial Managed Care - PPO | Admitting: Internal Medicine

## 2014-01-10 ENCOUNTER — Encounter: Payer: Self-pay | Admitting: Internal Medicine

## 2014-01-12 ENCOUNTER — Encounter: Payer: Self-pay | Admitting: *Deleted

## 2014-01-12 ENCOUNTER — Encounter: Payer: Self-pay | Admitting: Internal Medicine

## 2014-01-12 ENCOUNTER — Ambulatory Visit (INDEPENDENT_AMBULATORY_CARE_PROVIDER_SITE_OTHER): Payer: Commercial Managed Care - PPO | Admitting: Internal Medicine

## 2014-01-12 VITALS — BP 110/80 | HR 91 | Temp 98.5°F | Ht 68.0 in | Wt 303.2 lb

## 2014-01-12 DIAGNOSIS — J329 Chronic sinusitis, unspecified: Secondary | ICD-10-CM

## 2014-01-12 MED ORDER — AMOXICILLIN 875 MG PO TABS: 875.0000 mg | ORAL_TABLET | Freq: Two times a day (BID) | ORAL | Status: DC

## 2014-01-12 MED ORDER — NYSTATIN 100000 UNIT/ML MT SUSP: OROMUCOSAL | Status: DC

## 2014-01-12 NOTE — Progress Notes (Signed)
Pre-visit discussion using our clinic review tool. No additional management support is needed unless otherwise documented below in the visit note.  

## 2014-01-12 NOTE — Patient Instructions (Signed)
Saline nasal spray - flush nose at least 2-3x/day.   Flonase nasal spray - 2 sprays each nostril one time per day (do in the evening).    Mucinex in the am and robitussin in the evening

## 2014-01-15 ENCOUNTER — Encounter: Payer: Self-pay | Admitting: Internal Medicine

## 2014-01-15 DIAGNOSIS — J329 Chronic sinusitis, unspecified: Secondary | ICD-10-CM | POA: Insufficient documentation

## 2014-01-15 NOTE — Progress Notes (Signed)
  Subjective:    Patient ID: Erica Hernandez, female    DOB: Jun 14, 1976, 38 y.o.   MRN: [id number]  URI   38 year old female who comes in today as a work in with concerns regarding nasal stuffiness and sore throat.  She states that her symptoms started five days ago.  Increased nasal stuffiness and congestion.  Productive nasal congestion.  Sore throat and gums and tongue are sore.  No chest congestion.  Minimal cough.   Staying hydrated.  No fever.  Has taken tylenol cold and Daquil/Nuquil.      Past Medical History  Diagnosis Date  . Hypertension     during pregnancy     Current Outpatient Prescriptions on File Prior to Visit  Medication Sig Dispense Refill  . cetirizine (ZYRTEC) 10 MG tablet Take 10 mg by mouth daily.      . hydrocortisone (ANUSOL-HC) 25 MG suppository Place 1 suppository (25 mg total) rectally 2 (two) times daily.  14 suppository  0  . medroxyPROGESTERone (DEPO-PROVERA) 150 MG/ML injection Inject 150 mg into the muscle every 3 (three) months.      . meloxicam (MOBIC) 15 MG tablet Take 15 mg by mouth as needed.       . sertraline (ZOLOFT) 50 MG tablet Take 1 1/2 tablet q day  45 tablet  2  . triamcinolone cream (KENALOG) 0.1 % Apply topically 2 (two) times daily.  30 g  0   No current facility-administered medications on file prior to visit.    Review of Systems Patient denies any lightheadedness or dizziness.  Increased nasal congestion and sore throat.  Tongue is sore.  Minimal cough.  No chest congestion.  No chest pain, tightness or palpitations.   No sob.  No nausea or vomiting.  No acid reflux.  No abdominal pain or cramping.  Bowels stable.   She had been trying Atkins diet.          Objective:   Physical Exam  Filed Vitals:   01/12/14 0806  BP: 110/80  Pulse: 91  Temp: 98.5 F (36.9 C)   Blood pressure recheck:  5114/3282  38 year old female in no acute distress.   HEENT:  Nares- slightly erythematous turbinates.  Minimal tenderness to palpation over  the sinuses.   Oropharynx - without lesions.  Question of minimal irritation right lateral tongue.   NECK:  Supple.  Nontender.  No audible bruit.  HEART:  Appears to be regular. LUNGS:  No crackles or wheezing audible.  Respirations even and unlabored.  RADIAL PULSE:  Equal bilaterally.     ABDOMEN:  Soft, nontender.  Bowel sounds present and normal.  No audible abdominal bruit.  EXTREMITIES:  No increased edema present.       Assessment & Plan:  HEADACHE.  MRI negative.  Worked up by neurology.  Had allergy testing.  On zyrtec and flonase.  No significant change.  Discussed with her regarding seeing a headache specialist.  She desires no further intervention at this point.  Follow.    GYN.  Receiving Depo shots q 3 months.  Continue.     INCREASED PSYCHOSOCIAL STRESSORS.  On zoloft.  Doing well.  Follow.     ELEVATED BLOOD PRESSURE.  Blood pressure today - wnl.    HEALTH MAINTENANCE.  Physical 12/06/12.  Cholesterol panel 09/24/12 wnl.

## 2014-01-15 NOTE — Assessment & Plan Note (Signed)
Symptoms and exam as outlined.  Treat with amoxicillin bid as directed.  Flonase nasal spray and saline nasal spray as directed.  mucinex and robitussin as directed.  Follow.

## 2014-01-26 ENCOUNTER — Encounter: Payer: Self-pay | Admitting: Internal Medicine

## 2014-01-27 MED ORDER — SERTRALINE HCL 50 MG PO TABS: ORAL_TABLET | ORAL | Status: DC

## 2014-01-31 ENCOUNTER — Ambulatory Visit (INDEPENDENT_AMBULATORY_CARE_PROVIDER_SITE_OTHER): Payer: Commercial Managed Care - PPO | Admitting: *Deleted

## 2014-01-31 DIAGNOSIS — Z309 Encounter for contraceptive management, unspecified: Secondary | ICD-10-CM

## 2014-01-31 MED ORDER — MEDROXYPROGESTERONE ACETATE 150 MG/ML IM SUSP
150.0000 mg | Freq: Once | INTRAMUSCULAR | Status: AC
  Administered 2014-01-31: 150 mg via INTRAMUSCULAR

## 2014-02-22 ENCOUNTER — Encounter: Payer: Self-pay | Admitting: Internal Medicine

## 2014-03-06 ENCOUNTER — Encounter: Payer: Self-pay | Admitting: Internal Medicine

## 2014-03-20 ENCOUNTER — Ambulatory Visit (INDEPENDENT_AMBULATORY_CARE_PROVIDER_SITE_OTHER): Payer: Commercial Managed Care - PPO | Admitting: Internal Medicine

## 2014-03-20 ENCOUNTER — Encounter: Payer: Self-pay | Admitting: Internal Medicine

## 2014-03-20 VITALS — BP 110/80 | HR 76 | Temp 98.5°F | Ht 67.0 in | Wt 298.5 lb

## 2014-03-20 DIAGNOSIS — R5383 Other fatigue: Secondary | ICD-10-CM

## 2014-03-20 DIAGNOSIS — Z13220 Encounter for screening for lipoid disorders: Secondary | ICD-10-CM

## 2014-03-20 DIAGNOSIS — Z1239 Encounter for other screening for malignant neoplasm of breast: Secondary | ICD-10-CM

## 2014-03-20 DIAGNOSIS — R51 Headache: Secondary | ICD-10-CM

## 2014-03-20 DIAGNOSIS — R5381 Other malaise: Secondary | ICD-10-CM

## 2014-03-20 DIAGNOSIS — R519 Headache, unspecified: Secondary | ICD-10-CM | POA: Insufficient documentation

## 2014-03-20 DIAGNOSIS — K6289 Other specified diseases of anus and rectum: Secondary | ICD-10-CM | POA: Insufficient documentation

## 2014-03-20 LAB — COMPREHENSIVE METABOLIC PANEL
ALT: 22 U/L (ref 0–35)
AST: 22 U/L (ref 0–37)
Albumin: 3.8 g/dL (ref 3.5–5.2)
Alkaline Phosphatase: 73 U/L (ref 39–117)
BUN: 9 mg/dL (ref 6–23)
CALCIUM: 9.1 mg/dL (ref 8.4–10.5)
CO2: 24 meq/L (ref 19–32)
Chloride: 106 mEq/L (ref 96–112)
Creatinine, Ser: 0.9 mg/dL (ref 0.4–1.2)
GFR: 79.4 mL/min (ref >60.00)
Glucose, Bld: 95 mg/dL (ref 70–99)
POTASSIUM: 4.1 meq/L (ref 3.5–5.1)
Sodium: 137 mEq/L (ref 135–145)
TOTAL PROTEIN: 7 g/dL (ref 6.0–8.3)
Total Bilirubin: 0.5 mg/dL (ref 0.2–1.2)

## 2014-03-20 LAB — TSH: TSH: 2.13 u[IU]/mL (ref 0.35–4.50)

## 2014-03-20 LAB — CBC WITH DIFFERENTIAL/PLATELET
Basophils Absolute: 0 10*3/uL (ref 0.0–0.1)
Basophils Relative: 0.4 % (ref 0.0–3.0)
Eosinophils Absolute: 0.2 10*3/uL (ref 0.0–0.7)
Eosinophils Relative: 3.4 % (ref 0.0–5.0)
HCT: 39.1 % (ref 36.0–46.0)
HEMOGLOBIN: 13 g/dL (ref 12.0–15.0)
LYMPHS PCT: 28.1 % (ref 12.0–46.0)
Lymphs Abs: 2.1 10*3/uL (ref 0.7–4.0)
MCHC: 33.3 g/dL (ref 30.0–36.0)
MCV: 85 fl (ref 78.0–100.0)
MONOS PCT: 5.6 % (ref 3.0–12.0)
Monocytes Absolute: 0.4 10*3/uL (ref 0.1–1.0)
NEUTROS PCT: 62.5 % (ref 43.0–77.0)
Neutro Abs: 4.6 10*3/uL (ref 1.4–7.7)
Platelets: 236 10*3/uL (ref 150.0–400.0)
RBC: 4.61 Mil/uL (ref 3.87–5.11)
RDW: 14.9 % (ref 11.5–15.5)
WBC: 7.3 10*3/uL (ref 4.0–10.5)

## 2014-03-20 LAB — LIPID PANEL
Cholesterol: 155 mg/dL (ref 0–200)
HDL: 49.7 mg/dL (ref >39.00)
LDL Cholesterol: 94 mg/dL (ref 0–99)
Total CHOL/HDL Ratio: 3
Triglycerides: 56 mg/dL (ref 0.0–149.0)
VLDL: 11.2 mg/dL (ref 0.0–40.0)

## 2014-03-20 MED ORDER — MUPIROCIN CALCIUM 2 % EX CREA: 1.0000 "application " | TOPICAL_CREAM | Freq: Two times a day (BID) | CUTANEOUS | Status: DC

## 2014-03-20 MED ORDER — HYDROCORTISONE ACE-PRAMOXINE 1-1 % RE CREA: 1.0000 "application " | TOPICAL_CREAM | Freq: Two times a day (BID) | RECTAL | Status: DC

## 2014-03-20 NOTE — Progress Notes (Signed)
Subjective:    Patient ID: Erica DiverKristy Snellings, female    DOB: 1976-08-03, 38 y.o.   MRN: [id number]  HPI 38 year old female who comes in today for her complete physical exam.  She reports still intermittently noticing some ear fullness and discomfort behind her left ear.  No pain or fullness now.  Has seen ENT previously for this.  Had allergy testing.  Placed on steroids.  Taking zyrtec and using flonase and saline nasal spray.  Still occurs intermittently.  No nausea or vomiting.  Eating and drinking well.  Bowels stable.  Feels she is handling stress relatively well.  Still some rectal irritation.      Past Medical History  Diagnosis Date  . Hypertension     during pregnancy     Current Outpatient Prescriptions on File Prior to Visit  Medication Sig Dispense Refill  . cetirizine (ZYRTEC) 10 MG tablet Take 10 mg by mouth daily.      . medroxyPROGESTERone (DEPO-PROVERA) 150 MG/ML injection Inject 150 mg into the muscle every 3 (three) months.      . meloxicam (MOBIC) 15 MG tablet Take 15 mg by mouth as needed.       . sertraline (ZOLOFT) 50 MG tablet Take 1 1/2 tablet q day  135 tablet  0  . triamcinolone cream (KENALOG) 0.1 % Apply topically 2 (two) times daily.  30 g  0   No current facility-administered medications on file prior to visit.    Review of Systems Patient denies any lightheadedness or dizziness.  No headache or head fullness now.  Headaches have resolved.  No ear fullness now.   Previously worked up for headache.  MRI negative.  Saw neurology.   No sore throat.   No chest pain, tightness or palpitations.   No sob.  No nausea or vomiting.  No acid reflux.  No abdominal pain or cramping.  Bowels stable.  She does report some peri rectal irritation.  No bleeding.  Handling stress well.          Objective:   Physical Exam  Filed Vitals:   03/20/14 0801  BP: 110/80  Pulse: 76  Temp: 98.5 F (36.9 C)   Blood pressure recheck:  85118/6882  38 year old female in no acute  distress.   HEENT:  Nares- clear.  Oropharynx - without lesions. NECK:  Supple.  Nontender.  No audible bruit.  HEART:  Appears to be regular. LUNGS:  No crackles or wheezing audible.  Respirations even and unlabored.  RADIAL PULSE:  Equal bilaterally.    BREASTS:  No nipple discharge or nipple retraction present.  Could not appreciate any distinct nodules or axillary adenopathy.  ABDOMEN:  Soft, nontender.  Bowel sounds present and normal.  No audible abdominal bruit.  GU:  Not performed.     RECTAL:  Minimal perirectal irritation.     EXTREMITIES:  No increased edema present.  DP pulses palpable and equal bilaterally.          Assessment & Plan:  GYN.  Receiving Depo shots q 3 months.  Continue.   PAP 12/06/12 negative with negative HPV.   INCREASED PSYCHOSOCIAL STRESSORS.  On zoloft.  Doing well.  Follow.     ELEVATED BLOOD PRESSURE.  Blood pressure today - wnl.    HEALTH MAINTENANCE.  Physical today.  Cholesterol panel 09/24/12 wnl.  Recheck today.  Schedule baseline mammogram.    I spent 25 minutes with the patient and more than 50% of the  time was spent in consultation regarding the above.

## 2014-03-20 NOTE — Assessment & Plan Note (Signed)
Peri rectal irritation as outlined.  Use analpram cream as directed.  Follow.  Notify me if does not resolve.

## 2014-03-20 NOTE — Assessment & Plan Note (Signed)
Not an issue now.  Follow.    

## 2014-03-20 NOTE — Progress Notes (Signed)
Pre visit review using our clinic review tool, if applicable. No additional management support is needed unless otherwise documented below in the visit note. 

## 2014-03-27 ENCOUNTER — Other Ambulatory Visit: Payer: Self-pay | Admitting: Internal Medicine

## 2014-03-31 ENCOUNTER — Encounter: Payer: Self-pay | Admitting: *Deleted

## 2014-04-06 ENCOUNTER — Ambulatory Visit: Payer: Self-pay | Admitting: Internal Medicine

## 2014-04-06 LAB — HM MAMMOGRAPHY: HM MAMMO: NEGATIVE

## 2014-04-10 ENCOUNTER — Encounter: Payer: Self-pay | Admitting: Internal Medicine

## 2014-04-18 ENCOUNTER — Ambulatory Visit (INDEPENDENT_AMBULATORY_CARE_PROVIDER_SITE_OTHER): Payer: Commercial Managed Care - PPO | Admitting: *Deleted

## 2014-04-18 DIAGNOSIS — Z309 Encounter for contraceptive management, unspecified: Secondary | ICD-10-CM

## 2014-04-18 MED ORDER — MEDROXYPROGESTERONE ACETATE 150 MG/ML IM SUSP
150.0000 mg | Freq: Once | INTRAMUSCULAR | Status: AC
  Administered 2014-04-18: 150 mg via INTRAMUSCULAR

## 2014-06-05 ENCOUNTER — Telehealth: Payer: Self-pay | Admitting: *Deleted

## 2014-06-05 MED ORDER — SERTRALINE HCL 50 MG PO TABS: ORAL_TABLET | ORAL | Status: DC

## 2014-06-05 NOTE — Telephone Encounter (Signed)
Fax from pharmacy requesting Zoloft.  Last refill [phone].  Please advise refill.

## 2014-06-05 NOTE — Telephone Encounter (Signed)
I sent in rx to her mail order (for zoloft #135 with one refill).  Is this the correct pharmacy to send to?

## 2014-06-05 NOTE — Telephone Encounter (Signed)
rx sent to mail order pharmacy

## 2014-07-18 ENCOUNTER — Encounter: Payer: Self-pay | Admitting: Internal Medicine

## 2014-07-18 ENCOUNTER — Ambulatory Visit (INDEPENDENT_AMBULATORY_CARE_PROVIDER_SITE_OTHER): Payer: Commercial Managed Care - PPO | Admitting: Internal Medicine

## 2014-07-18 VITALS — BP 122/70 | HR 78 | Temp 99.2°F | Ht 67.0 in | Wt 314.5 lb

## 2014-07-18 DIAGNOSIS — Z733 Stress, not elsewhere classified: Secondary | ICD-10-CM

## 2014-07-18 DIAGNOSIS — R6889 Other general symptoms and signs: Secondary | ICD-10-CM

## 2014-07-18 DIAGNOSIS — Z309 Encounter for contraceptive management, unspecified: Secondary | ICD-10-CM

## 2014-07-18 DIAGNOSIS — Z23 Encounter for immunization: Secondary | ICD-10-CM

## 2014-07-18 DIAGNOSIS — F439 Reaction to severe stress, unspecified: Secondary | ICD-10-CM

## 2014-07-18 MED ORDER — MEDROXYPROGESTERONE ACETATE 150 MG/ML IM SUSP
150.0000 mg | Freq: Once | INTRAMUSCULAR | Status: AC
  Administered 2014-07-18: 150 mg via INTRAMUSCULAR

## 2014-07-18 NOTE — Progress Notes (Signed)
  Subjective:    Patient ID: Erica Hernandez, female    DOB: 08-06-1976, 38 y.o.   MRN: [id number]  HPI 38 year old female who comes in today for a scheduled follow up..  She reports still intermittently noticing some ear fullness.  She had been doing well, but recently noticed some increased head fullness and head pressure.  Had been doing better, but this reoccurred recently.   Has seen ENT previously for this.  Had allergy testing.  Placed on steroids.  Taking zyrtec.  Does not use nasal sprays.  Taking sudafed and dayquil.  Feels this helps some.   No nausea or vomiting.  Eating and drinking well.  Bowels stable.  Feels she is handling stress relatively well.  Does have increased stress with family issues.       Past Medical History  Diagnosis Date  . Hypertension     during pregnancy     Current Outpatient Prescriptions on File Prior to Visit  Medication Sig Dispense Refill  . cetirizine (ZYRTEC) 10 MG tablet Take 10 mg by mouth daily.      . medroxyPROGESTERone (DEPO-PROVERA) 150 MG/ML injection Inject 150 mg into the muscle every 3 (three) months.      . meloxicam (MOBIC) 15 MG tablet Take 15 mg by mouth as needed.       . pramoxine-hydrocortisone (ANALPRAM-HC) 1-1 % rectal cream Place 1 application rectally 2 (two) times daily.  30 g  0  . sertraline (ZOLOFT) 50 MG tablet Take 1 1/2 tablet q day  135 tablet  1   No current facility-administered medications on file prior to visit.    Review of Systems Patient denies any lightheadedness or dizziness.  She does report the head dullness and pressure as outlined.  Previously worked up for headache.  MRI negative.  Saw neurology.   No sore throat.   No chest pain, tightness or palpitations.   No sob.  No nausea or vomiting.  No acid reflux.  No abdominal pain or cramping.  Bowels stable.  increased stress as outlined.  Feels she is handling things relatively well.         Objective:   Physical Exam  Filed Vitals:   07/18/14 1050  BP:  122/70  Pulse: 78  Temp: 99.2 F (37.3 C)   Blood pressure recheck:  95/34  38 year old female in no acute distress.   HEENT:  Nares- clear.  Oropharynx - without lesions. NECK:  Supple.  Nontender.  No audible bruit.  HEART:  Appears to be regular. LUNGS:  No crackles or wheezing audible.  Respirations even and unlabored.  RADIAL PULSE:  Equal bilaterally.  ABDOMEN:  Soft, nontender.  Bowel sounds present and normal.  No audible abdominal bruit.    EXTREMITIES:  No increased edema present.  DP pulses palpable and equal bilaterally.          Assessment & Plan:  GYN.  Receiving Depo shots q 3 months.  Continue.   PAP 12/06/12 negative with negative HPV.   ELEVATED BLOOD PRESSURE.  Blood pressure today - wnl.    HEALTH MAINTENANCE.  Physical 03/20/14.  Mammogram 04/06/14 - [facility] I.

## 2014-07-18 NOTE — Progress Notes (Signed)
Pre visit review using our clinic review tool, if applicable. No additional management support is needed unless otherwise documented below in the visit note. 

## 2014-07-19 ENCOUNTER — Encounter: Payer: Self-pay | Admitting: Internal Medicine

## 2014-07-23 ENCOUNTER — Encounter: Payer: Self-pay | Admitting: Internal Medicine

## 2014-07-23 DIAGNOSIS — F439 Reaction to severe stress, unspecified: Secondary | ICD-10-CM | POA: Insufficient documentation

## 2014-07-23 DIAGNOSIS — R6889 Other general symptoms and signs: Secondary | ICD-10-CM | POA: Insufficient documentation

## 2014-07-23 NOTE — Assessment & Plan Note (Signed)
Increased stress as outlined.  On zoloft.  Feels she is handling things relatively well.  Does not feel she needs any further intervention at this point.  Follow.

## 2014-07-23 NOTE — Assessment & Plan Note (Signed)
Has been worked up extensively as outlined.  Discussed with her today.  Has had MRI - negative.  Has seen neurology and ENT.  Will start mucinex as directed.  Try to use saline nasal spray.  Discussed further w/up.  She wants to monitor for now  Follow.

## 2014-07-24 ENCOUNTER — Ambulatory Visit: Payer: Commercial Managed Care - PPO | Admitting: Internal Medicine

## 2014-10-12 ENCOUNTER — Ambulatory Visit (INDEPENDENT_AMBULATORY_CARE_PROVIDER_SITE_OTHER): Payer: Commercial Managed Care - PPO | Admitting: Internal Medicine

## 2014-10-12 ENCOUNTER — Encounter: Payer: Self-pay | Admitting: Internal Medicine

## 2014-10-12 ENCOUNTER — Telehealth: Payer: Self-pay | Admitting: Internal Medicine

## 2014-10-12 VITALS — BP 124/84 | HR 81 | Temp 98.3°F | Ht 67.0 in | Wt 333.5 lb

## 2014-10-12 DIAGNOSIS — H9209 Otalgia, unspecified ear: Secondary | ICD-10-CM | POA: Insufficient documentation

## 2014-10-12 DIAGNOSIS — R6889 Other general symptoms and signs: Secondary | ICD-10-CM

## 2014-10-12 DIAGNOSIS — Z658 Other specified problems related to psychosocial circumstances: Secondary | ICD-10-CM

## 2014-10-12 DIAGNOSIS — H9202 Otalgia, left ear: Secondary | ICD-10-CM

## 2014-10-12 DIAGNOSIS — F439 Reaction to severe stress, unspecified: Secondary | ICD-10-CM

## 2014-10-12 DIAGNOSIS — Z309 Encounter for contraceptive management, unspecified: Secondary | ICD-10-CM

## 2014-10-12 MED ORDER — MEDROXYPROGESTERONE ACETATE 150 MG/ML IM SUSP
150.0000 mg | Freq: Once | INTRAMUSCULAR | Status: AC
  Administered 2014-10-12: 150 mg via INTRAMUSCULAR

## 2014-10-12 MED ORDER — MEDROXYPROGESTERONE ACETATE 150 MG/ML IM SUSP: 150.0000 mg | Freq: Once | INTRAMUSCULAR | Status: DC

## 2014-10-12 NOTE — Telephone Encounter (Signed)
I can see her at 12:00 on 11/17/2014

## 2014-10-12 NOTE — Telephone Encounter (Signed)
Patient needs a 5 week 30 min follow up appt but there are no open appts.  Please advise as to where you would like her to be put on the schedule.  Please call her cell at [phone].

## 2014-10-12 NOTE — Progress Notes (Signed)
Pre visit review using our clinic review tool, if applicable. No additional management support is needed unless otherwise documented below in the visit note. 

## 2014-10-12 NOTE — Progress Notes (Signed)
Subjective:    Patient ID: Erica StampsKristy L Hernandez, female    DOB: [birth year], 38 y.o.   MRN: [id number]  HPI 38 year old female who comes in today for a scheduled follow up.  Still with increased stress.  Father has been in the hospital for one month.  Still with increased family stress.  Overall she feels she is handling things relatively well.  She has increased pain in her ear.  This is a persistent intermittent issue for her.  Has seen ENT previously for this.  Ear checks out fine. Had allergy testing.  Placed on steroids.  Taking zyrtec.  Does not use nasal sprays.   Was questioning if the pain could be TMJ.  Has a history of TMJ.   No nausea or vomiting.  Eating and drinking well.  Bowels stable.  Previously had some epigastric discomfort.  Resolved.  No pain now.       Past Medical History  Diagnosis Date  . Hypertension     during pregnancy     Current Outpatient Prescriptions on File Prior to Visit  Medication Sig Dispense Refill  . cetirizine (ZYRTEC) 10 MG tablet Take 10 mg by mouth daily.    . medroxyPROGESTERone (DEPO-PROVERA) 150 MG/ML injection Inject 150 mg into the muscle every 3 (three) months.    . meloxicam (MOBIC) 15 MG tablet Take 15 mg by mouth as needed.     . pramoxine-hydrocortisone (ANALPRAM-HC) 1-1 % rectal cream Place 1 application rectally 2 (two) times daily. 30 g 0  . sertraline (ZOLOFT) 50 MG tablet Take 1 1/2 tablet q day 135 tablet 1   No current facility-administered medications on file prior to visit.    Review of Systems Patient denies any lightheadedness or dizziness.  Pain left ear/jaw.  Previously worked up for headache.  MRI negative.  Saw neurology.   No sore throat.  No significant congestion.  No chest pain, tightness or palpitations.   No sob.  No nausea or vomiting.  No acid reflux.  No abdominal pain or cramping.  Bowels stable.  Increased stress as outlined.  Feels she is handling things relatively well.         Objective:   Physical  Exam  Filed Vitals:   10/12/14 0759  BP: 124/84  Pulse: 81  Temp: 98.3 F (36.8 C)   Blood pressure recheck:  72128/6088-4892  38 year old female in no acute distress.   HEENT:  Nares- clear.  Oropharynx - without lesions.  TMs without erythema.   NECK:  Supple.  Nontender.  No audible bruit.  Increased pain to palpation - left TMJ.  Some pain with opening and closing of her mouth.   HEART:  Appears to be regular. LUNGS:  No crackles or wheezing audible.  Respirations even and unlabored.  RADIAL PULSE:  Equal bilaterally.  ABDOMEN:  Soft, nontender.  Bowel sounds present and normal.  No audible abdominal bruit.    EXTREMITIES:  No increased edema present.  DP pulses palpable and equal bilaterally.          Assessment & Plan:  Stress Increased stress as outlined.  Feels she is coping well.  On zoloft.  Fllow.    Fullness in head Not an issue now.  Follow.    Ear pain, left Ear with no erythema or acute abnormality.  May be related to TMJ.  Will have her see her dentist.  Further w/up pending their assessment.    GYN.  Receiving Depo shots  q 3 months.  Continue.   PAP 12/06/12 negative with negative HPV.   ELEVATED BLOOD PRESSURE.  Blood pressure borderline today.  Have her spot check her pressure.  Get her back in soon to reassess.      HEALTH MAINTENANCE.  Physical 03/20/14.  Mammogram 04/06/14 - Birads I.

## 2014-10-15 ENCOUNTER — Encounter: Payer: Self-pay | Admitting: Internal Medicine

## 2014-10-17 ENCOUNTER — Ambulatory Visit: Payer: Commercial Managed Care - PPO | Admitting: Internal Medicine

## 2014-11-01 ENCOUNTER — Other Ambulatory Visit: Payer: Self-pay | Admitting: Internal Medicine

## 2014-11-17 ENCOUNTER — Encounter: Payer: Self-pay | Admitting: Internal Medicine

## 2014-11-17 ENCOUNTER — Ambulatory Visit (INDEPENDENT_AMBULATORY_CARE_PROVIDER_SITE_OTHER): Payer: Commercial Managed Care - PPO | Admitting: Internal Medicine

## 2014-11-17 VITALS — BP 130/80 | HR 88 | Temp 98.5°F | Ht 67.0 in | Wt 342.0 lb

## 2014-11-17 DIAGNOSIS — R030 Elevated blood-pressure reading, without diagnosis of hypertension: Secondary | ICD-10-CM

## 2014-11-17 DIAGNOSIS — H9202 Otalgia, left ear: Secondary | ICD-10-CM

## 2014-11-17 DIAGNOSIS — Z658 Other specified problems related to psychosocial circumstances: Secondary | ICD-10-CM

## 2014-11-17 DIAGNOSIS — IMO0001 Reserved for inherently not codable concepts without codable children: Secondary | ICD-10-CM

## 2014-11-17 DIAGNOSIS — F439 Reaction to severe stress, unspecified: Secondary | ICD-10-CM

## 2014-11-17 DIAGNOSIS — R6889 Other general symptoms and signs: Secondary | ICD-10-CM

## 2014-11-17 DIAGNOSIS — Z6841 Body Mass Index (BMI) 40.0 and over, adult: Secondary | ICD-10-CM

## 2014-11-17 MED ORDER — MUPIROCIN 2 % EX OINT: 1.0000 "application " | TOPICAL_OINTMENT | Freq: Two times a day (BID) | CUTANEOUS | Status: DC

## 2014-11-17 NOTE — Progress Notes (Signed)
Pre visit review using our clinic review tool, if applicable. No additional management support is needed unless otherwise documented below in the visit note. 

## 2014-11-19 ENCOUNTER — Encounter: Payer: Self-pay | Admitting: Internal Medicine

## 2014-11-19 DIAGNOSIS — I10 Essential (primary) hypertension: Secondary | ICD-10-CM | POA: Insufficient documentation

## 2014-11-19 NOTE — Progress Notes (Signed)
Subjective:    Patient ID: Erica Hernandez, female    DOB: 1976-09-11, 39 y.o.   MRN: [id number]  HPI 39 year old female who comes in today for a scheduled follow up.  Overall she feels she is handling things relatively well.  She has increased pain in her ear intermittently.  Has seen ENT previously for this.  Ear checks out fine. Had allergy testing.  Placed on steroids.  Taking zyrtec.  Does not use nasal sprays.   Was questioning if the pain could be TMJ.  Has a history of TMJ.  Saw her dentist.  Had xrays.  Dentist did not feel related to TMJ.  We discussed further evaluation.  She wants to just watch for now.   No nausea or vomiting.  Eating and drinking well.  Bowels stable. Blood pressure on her outside checks averaging 130-140s/80-90s.  She states her cuff may be too small.       Past Medical History  Diagnosis Date  . Hypertension     during pregnancy     Current Outpatient Prescriptions on File Prior to Visit  Medication Sig Dispense Refill  . cetirizine (ZYRTEC) 10 MG tablet Take 10 mg by mouth daily.    . medroxyPROGESTERone (DEPO-PROVERA) 150 MG/ML injection Inject 150 mg into the muscle every 3 (three) months.    . meloxicam (MOBIC) 15 MG tablet Take 15 mg by mouth as needed.     . pramoxine-hydrocortisone (ANALPRAM-HC) 1-1 % rectal cream Place 1 application rectally 2 (two) times daily. 30 g 0  . sertraline (ZOLOFT) 50 MG tablet Take 1 & 1/2 tablets daily 135 tablet 2   No current facility-administered medications on file prior to visit.    Review of Systems Patient denies any lightheadedness or dizziness.  Pain left ear/jaw.  Previously worked up for headache.  MRI negative.  Saw neurology.  Recently saw her dentist.  Did not think TMJ.   No sore throat.  No significant congestion.  No chest pain, tightness or palpitations.   No sob.  No nausea or vomiting.  No acid reflux.  No abdominal pain or cramping.  Bowels stable.  Increased stress as outlined.  Feels she is handling  things relatively well.  Blood pressure as outlined.         Objective:   Physical Exam  Filed Vitals:   11/17/14 1248  BP: 130/80  Pulse: 88  Temp: 98.5 F (36.9 C)   Blood pressure recheck:  [id number]/[id number]  39 year old female in no acute distress.   HEENT:  Nares- clear.  Oropharynx - without lesions.  TMs without erythema.   NECK:  Supple.  Nontender.  No audible bruit.   HEART:  Appears to be regular. LUNGS:  No crackles or wheezing audible.  Respirations even and unlabored.  RADIAL PULSE:  Equal bilaterally.  ABDOMEN:  Soft, nontender.  Bowel sounds present and normal.  No audible abdominal bruit.    EXTREMITIES:  No increased edema present.  DP pulses palpable and equal bilaterally.          Assessment & Plan:  1. Ear pain, left Saw ENT.  MRI negative.  Saw her dentist.  No significant pain today.  Denies no further w/up at this time.    2. Stress On zoloft.  Stable.    3. Fullness in head Not present now.  Saw neurology.  MRI negative.    4. Elevated blood pressure Blood pressure as outlined.  Wanted to start medication.  She declines.  Wants to work on diet and exercise.  Follow pressures.  Get her back in soon to reassess.    5. Morbid obesity with BMI of 50.0-59.9, adult Diet and exercise.    6. GYN.  Receiving Depo shots q 3 months.  Continue.   PAP 12/06/12 negative with negative HPV.       HEALTH MAINTENANCE.  Physical 03/20/14.  Mammogram 04/06/14 - Birads I.

## 2014-11-26 ENCOUNTER — Encounter: Payer: Self-pay | Admitting: Internal Medicine

## 2014-12-04 ENCOUNTER — Encounter: Payer: Self-pay | Admitting: Internal Medicine

## 2014-12-10 ENCOUNTER — Encounter: Payer: Self-pay | Admitting: Internal Medicine

## 2014-12-11 ENCOUNTER — Ambulatory Visit: Payer: Commercial Managed Care - PPO | Admitting: Internal Medicine

## 2014-12-14 ENCOUNTER — Ambulatory Visit: Payer: Self-pay | Admitting: Physician Assistant

## 2014-12-28 ENCOUNTER — Encounter: Payer: Self-pay | Admitting: Internal Medicine

## 2015-01-09 ENCOUNTER — Ambulatory Visit (INDEPENDENT_AMBULATORY_CARE_PROVIDER_SITE_OTHER): Payer: Commercial Managed Care - PPO | Admitting: Internal Medicine

## 2015-01-09 ENCOUNTER — Encounter: Payer: Self-pay | Admitting: Internal Medicine

## 2015-01-09 ENCOUNTER — Ambulatory Visit: Payer: Commercial Managed Care - PPO

## 2015-01-09 VITALS — BP 131/84 | HR 88 | Temp 98.5°F | Ht 67.0 in | Wt 338.5 lb

## 2015-01-09 DIAGNOSIS — I10 Essential (primary) hypertension: Secondary | ICD-10-CM

## 2015-01-09 DIAGNOSIS — F439 Reaction to severe stress, unspecified: Secondary | ICD-10-CM

## 2015-01-09 DIAGNOSIS — Z658 Other specified problems related to psychosocial circumstances: Secondary | ICD-10-CM

## 2015-01-09 DIAGNOSIS — Z309 Encounter for contraceptive management, unspecified: Secondary | ICD-10-CM

## 2015-01-09 MED ORDER — MEDROXYPROGESTERONE ACETATE 150 MG/ML IM SUSP
150.0000 mg | Freq: Once | INTRAMUSCULAR | Status: AC
  Administered 2015-01-09: 150 mg via INTRAMUSCULAR

## 2015-01-09 MED ORDER — LISINOPRIL 10 MG PO TABS: 10.0000 mg | ORAL_TABLET | Freq: Every day | ORAL | Status: DC

## 2015-01-09 NOTE — Progress Notes (Signed)
Patient ID: Erica Hernandez, female   DOB: 17-Feb-1976, 39 y.o.   MRN: [id number]   Subjective:    Patient ID: Erica Hernandez, female    DOB: Aug 26, 1976, 39 y.o.   MRN: [id number]  HPI  Patient here to follow up on her blood pressure.  Has been elevated. See her messaging for details.  No headache.  No dizziness.  No cardiac symptoms with increased activity or exertion.  Breathing stable.  Discussed diet and exercise.  She had questions about gastric surgery.     Past Medical History  Diagnosis Date  . Hypertension     during pregnancy    Current Outpatient Prescriptions on File Prior to Visit  Medication Sig Dispense Refill  . cetirizine (ZYRTEC) 10 MG tablet Take 10 mg by mouth daily.    . meloxicam (MOBIC) 15 MG tablet Take 15 mg by mouth as needed.     . mupirocin ointment (BACTROBAN) 2 % Place 1 application into the nose 2 (two) times daily. 22 g 0  . pramoxine-hydrocortisone (ANALPRAM-HC) 1-1 % rectal cream Place 1 application rectally 2 (two) times daily. 30 g 0  . sertraline (ZOLOFT) 50 MG tablet Take 1 & 1/2 tablets daily 135 tablet 2   No current facility-administered medications on file prior to visit.    Review of Systems  Constitutional: Negative for appetite change and unexpected weight change.  HENT: Negative for congestion and sinus pressure.   Respiratory: Negative for cough, chest tightness and shortness of breath.   Cardiovascular: Negative for chest pain, palpitations and leg swelling.  Gastrointestinal: Negative for nausea, vomiting and abdominal pain.  Neurological: Negative for dizziness, light-headedness and headaches.  Psychiatric/Behavioral:       Handling stress well.         Objective:    Physical Exam  Constitutional: She appears well-developed and well-nourished. No distress.  HENT:  Nose: Nose normal.  Mouth/Throat: Oropharynx is clear and moist.  Neck: Neck supple. No thyromegaly present.  Cardiovascular: Normal rate and regular rhythm.     Pulmonary/Chest: Breath sounds normal. No respiratory distress. She has no wheezes.  Abdominal: Soft. Bowel sounds are normal. There is no tenderness.  Musculoskeletal: She exhibits no edema or tenderness.  Lymphadenopathy:    She has no cervical adenopathy.    BP 131/84 mmHg  Pulse 88  Temp(Src) 98.5 F (36.9 C) (Oral)  Ht '5\' 7"'  (1.702 m)  Wt 338 lb 8 oz (153.543 kg)  BMI 53.00 kg/m2  SpO2 100% Wt Readings from Last 3 Encounters:  01/09/15 338 lb 8 oz (153.543 kg)  11/17/14 342 lb (155.13 kg)  10/12/14 333 lb 8 oz (151.275 kg)     Lab Results  Component Value Date   WBC 7.3 03/20/2014   HGB 13.0 03/20/2014   HCT 39.1 03/20/2014   PLT 236.0 03/20/2014   GLUCOSE 95 03/20/2014   CHOL 155 03/20/2014   TRIG 56.0 03/20/2014   HDL 49.70 03/20/2014   LDLCALC 94 03/20/2014   ALT 22 03/20/2014   AST 22 03/20/2014   NA 137 03/20/2014   K 4.1 03/20/2014   CL 106 03/20/2014   CREATININE 0.9 03/20/2014   BUN 9 03/20/2014   CO2 24 03/20/2014   TSH 2.13 03/20/2014      Assessment & Plan:   Problem List Items Addressed This Visit    Hypertension, essential    Blood pressure has been elevated.  Start lisinopril 30m q day.  Check met b in 10-14 days.  Follow pressures.  Get her back in soon to reassess.  We discussed pregnancy and lisinopril being contraindicated.  On depo.  Follow.        Relevant Medications   lisinopril (PRINIVIL,ZESTRIL) tablet   Obesity, morbid, BMI 50 or higher    Discussed diet and exercise.        Stress    Feels she is handling things well.  On zoloft.  Follow.         Other Visit Diagnoses    Encounter for contraceptive management, unspecified encounter    -  Primary    Relevant Medications    medroxyPROGESTERone (DEPO-PROVERA) injection 150 mg (Completed)        Einar Pheasant, MD

## 2015-01-09 NOTE — Progress Notes (Signed)
Pre visit review using our clinic review tool, if applicable. No additional management support is needed unless otherwise documented below in the visit note. 

## 2015-01-14 ENCOUNTER — Encounter: Payer: Self-pay | Admitting: Internal Medicine

## 2015-01-14 NOTE — Assessment & Plan Note (Signed)
Feels she is handling things well.  On zoloft.  Follow.

## 2015-01-14 NOTE — Assessment & Plan Note (Signed)
Discussed diet and exercise 

## 2015-01-14 NOTE — Assessment & Plan Note (Addendum)
Blood pressure has been elevated.  Start lisinopril 61m q day.  Check met b in 10-14 days.  Follow pressures.  Get her back in soon to reassess.  We discussed pregnancy and lisinopril being contraindicated.  On depo.  Follow.

## 2015-01-15 ENCOUNTER — Encounter: Payer: Self-pay | Admitting: Internal Medicine

## 2015-01-22 ENCOUNTER — Encounter: Payer: Self-pay | Admitting: Internal Medicine

## 2015-01-23 ENCOUNTER — Other Ambulatory Visit (INDEPENDENT_AMBULATORY_CARE_PROVIDER_SITE_OTHER): Payer: Commercial Managed Care - PPO

## 2015-01-23 DIAGNOSIS — I10 Essential (primary) hypertension: Secondary | ICD-10-CM

## 2015-01-24 ENCOUNTER — Encounter: Payer: Self-pay | Admitting: Internal Medicine

## 2015-01-24 LAB — BASIC METABOLIC PANEL
BUN: 9 mg/dL (ref 6–23)
CO2: 24 mEq/L (ref 19–32)
CREATININE: 1.05 mg/dL (ref 0.40–1.20)
Calcium: 9.1 mg/dL (ref 8.4–10.5)
Chloride: 105 mEq/L (ref 96–112)
GFR: 62.77 mL/min (ref >60.00)
Glucose, Bld: 93 mg/dL (ref 70–99)
Potassium: 4 mEq/L (ref 3.5–5.1)
Sodium: 137 mEq/L (ref 135–145)

## 2015-01-29 ENCOUNTER — Other Ambulatory Visit: Payer: Self-pay | Admitting: *Deleted

## 2015-01-29 MED ORDER — LISINOPRIL 10 MG PO TABS: 10.0000 mg | ORAL_TABLET | Freq: Every day | ORAL | Status: DC

## 2015-02-20 ENCOUNTER — Encounter: Payer: Self-pay | Admitting: Internal Medicine

## 2015-03-02 ENCOUNTER — Encounter: Payer: Self-pay | Admitting: Internal Medicine

## 2015-03-02 ENCOUNTER — Ambulatory Visit (INDEPENDENT_AMBULATORY_CARE_PROVIDER_SITE_OTHER): Payer: Commercial Managed Care - PPO | Admitting: Internal Medicine

## 2015-03-02 VITALS — BP 118/72 | HR 83 | Temp 99.0°F | Resp 14 | Ht 67.0 in | Wt 345.2 lb

## 2015-03-02 DIAGNOSIS — R6889 Other general symptoms and signs: Secondary | ICD-10-CM | POA: Diagnosis not present

## 2015-03-02 DIAGNOSIS — Z658 Other specified problems related to psychosocial circumstances: Secondary | ICD-10-CM

## 2015-03-02 DIAGNOSIS — I10 Essential (primary) hypertension: Secondary | ICD-10-CM

## 2015-03-02 DIAGNOSIS — F439 Reaction to severe stress, unspecified: Secondary | ICD-10-CM

## 2015-03-02 NOTE — Progress Notes (Signed)
Pre visit review using our clinic review tool, if applicable. No additional management support is needed unless otherwise documented below in the visit note. 

## 2015-03-03 ENCOUNTER — Encounter: Payer: Self-pay | Admitting: Internal Medicine

## 2015-03-03 NOTE — Progress Notes (Signed)
Patient ID: Erica StampsKristy L Hernandez, female   DOB: August 08, 1976, 39 y.o.   MRN: [id number]   Subjective:    Patient ID: Erica StampsKristy L Hernandez, female    DOB: August 08, 1976, 39 y.o.   MRN: [id number]  HPI  Patient here for a scheduled follow up.  Here to follow up on her blood pressure.  Started on lisinopril last visit.  Tolerating.  Blood pressures doing better.  Some increased stress.  Grandmother recently diagnosed with mild dementia.  Overall she feels she is handling things relatively well.  Discussed diet and exercise.     Past Medical History  Diagnosis Date  . Hypertension     during pregnancy    Outpatient Encounter Prescriptions as of 03/02/2015  Medication Sig  . cetirizine (ZYRTEC) 10 MG tablet Take 10 mg by mouth daily.  Marland Kitchen. lisinopril (PRINIVIL,ZESTRIL) 10 MG tablet Take 1 tablet (10 mg total) by mouth daily.  . meloxicam (MOBIC) 15 MG tablet Take 15 mg by mouth as needed.   . sertraline (ZOLOFT) 50 MG tablet Take 75 mg by mouth daily.  . pramoxine-hydrocortisone (ANALPRAM-HC) 1-1 % rectal cream Place 1 application rectally 2 (two) times daily. (Patient not taking: Reported on 03/02/2015)  . [DISCONTINUED] mupirocin ointment (BACTROBAN) 2 % Place 1 application into the nose 2 (two) times daily.  . [DISCONTINUED] sertraline (ZOLOFT) 50 MG tablet Take 1 & 1/2 tablets daily (Patient taking differently: Take 1 and 1/2 tablets daily)    Review of Systems  Constitutional: Negative for appetite change and unexpected weight change.  HENT: Negative for congestion and sinus pressure.   Respiratory: Negative for cough, chest tightness and shortness of breath.   Cardiovascular: Negative for chest pain, palpitations and leg swelling.  Gastrointestinal: Negative for nausea, vomiting, abdominal pain and diarrhea.  Neurological: Negative for dizziness, light-headedness and headaches.  Psychiatric/Behavioral: Negative for dysphoric mood and agitation.       Increased stress as outlined.         Objective:       Blood pressure recheck:  126/78  Physical Exam  Constitutional: She appears well-developed and well-nourished. No distress.  HENT:  Nose: Nose normal.  Mouth/Throat: Oropharynx is clear and moist.  Neck: Neck supple. No thyromegaly present.  Cardiovascular: Normal rate and regular rhythm.   Pulmonary/Chest: Breath sounds normal. No respiratory distress. She has no wheezes.  Abdominal: Soft. Bowel sounds are normal. There is no tenderness.  Musculoskeletal: She exhibits no edema or tenderness.  Lymphadenopathy:    She has no cervical adenopathy.  Psychiatric: She has a normal mood and affect. Her behavior is normal.    BP 118/72 mmHg  Pulse 83  Temp(Src) 99 F (37.2 C) (Oral)  Resp 14  Ht 5\' 7"  (1.702 m)  Wt 345 lb 3.2 oz (156.582 kg)  BMI 54.05 kg/m2  SpO2 98% Wt Readings from Last 3 Encounters:  03/02/15 345 lb 3.2 oz (156.582 kg)  01/09/15 338 lb 8 oz (153.543 kg)  11/17/14 342 lb (155.13 kg)     Lab Results  Component Value Date   WBC 7.3 03/20/2014   HGB 13.0 03/20/2014   HCT 39.1 03/20/2014   PLT 236.0 03/20/2014   GLUCOSE 93 01/23/2015   CHOL 155 03/20/2014   TRIG 56.0 03/20/2014   HDL 49.70 03/20/2014   LDLCALC 94 03/20/2014   ALT 22 03/20/2014   AST 22 03/20/2014   NA 137 01/23/2015   K 4.0 01/23/2015   CL 105 01/23/2015   CREATININE 1.05 01/23/2015  BUN 9 01/23/2015   CO2 24 01/23/2015   TSH 2.13 03/20/2014       Assessment & Plan:   Problem List Items Addressed This Visit    Fullness in head    Overall stable.  Has not been a significant issue for her.  Follow.        Hypertension, essential - Primary    Blood pressure better.  Same medication regimen.  Follow metabolic panel.  Follow pressures.        Obesity, morbid, BMI 50 or higher    Diet and exercise.  Follow.        Stress    On zoloft.  Feels she is handling things relatively well.  Follow.           Dale [place], MD

## 2015-03-03 NOTE — Assessment & Plan Note (Signed)
On zoloft.  Feels she is handling things relatively well.  Follow.   

## 2015-03-03 NOTE — Assessment & Plan Note (Signed)
Blood pressure better.  Same medication regimen.  Follow metabolic panel.  Follow pressures.

## 2015-03-03 NOTE — Assessment & Plan Note (Signed)
Overall stable.  Has not been a significant issue for her.  Follow.

## 2015-03-03 NOTE — Assessment & Plan Note (Signed)
Diet and exercise.  Follow.  

## 2015-03-04 NOTE — Op Note (Signed)
PATIENT NAME:  Erica Hernandez, Erica Hernandez MR#:  [id number] DATE OF BIRTH:  1975/11/18  DATE OF PROCEDURE:  03/29/2012  PREOPERATIVE DIAGNOSIS: Impingement symptomatology, left shoulder, with possible bicipital tendinitis.   POSTOPERATIVE DIAGNOSIS: Impingement symptomatology, left shoulder, with possible bicipital tendinitis.   OPERATION:  1. Arthroscopic subacromial decompression, left shoulder. 2. Arthroscopic release of the long head of the biceps tendon.   SURGEON: Alda BertholdHarold B. Katanya Schlie Jr., MD   ANESTHESIA: General.   HISTORY: The patient had a long history of left shoulder pain. She had not responded to injections of her bicipital groove with steroid and anesthetic. Plain films appeared to be normal for age. MRI was obtained which revealed possible fraying of the long head of her biceps tendon along with some tendinosis of the supraspinatus and infraspinatus tendons.   The patient was ultimately brought in for surgery due to her persistent symptoms despite conservative treatment.   PROCEDURE: The patient was taken to the operating room where satisfactory general anesthesia was achieved. The patient was then turned to the lateral decubitus position with the left shoulder up. The left shoulder was prepped and draped in the usual fashion for an arthroscopic procedure. Shoulder was suspended with Acufex shoulder suspension device. We used 12 pounds of traction throughout the procedure. The shoulder was maintained in about 25 degrees of abduction and about 10 degrees of forward flexion.   The patient, incidentally, was given 2 grams Kefzol IV prior to the start of the procedure.   Next, the scope was introduced through a posterior puncture wound into the glenohumeral joint. The joint was distended with lactated Ringer's. We used a Mitek fluid pump to facilitate joint distention.   Inspection of the glenohumeral joint revealed that the articular surfaces were smooth. The labrum was intact. The biceps  tendon appeared to be an intact. The subscapularis was not frayed. There was a little fraying of the undersurface of the rotator cuff near its attachment to the lateral aspect of the greater tuberosity. This area of fraying was quite small.   I went ahead and removed the scope from the glenohumeral joint and then inserted it into the subacromial space. I established a lateral portal through which a shaver was brought in. I went ahead and debrided some thickened synovial bursal material.   Inspection of the cuff revealed a little fraying superolaterally but the area of fraying was quite small. No obvious cuff tear was appreciated. There was some question in my mind of some narrowing of her subacromial space. I went ahead and introduced an angled ArthroCare thermal wand through the lateral portal and used it to remove the soft tissue from the undersurface of the acromion. The scope was then switched to the lateral portal and a large acromionizer bur was brought in through the posterior portal. Subacromial decompression was performed without difficulty. The acromial attachment of the coracoacromial ligament was released at this time.   I then switched the scope back to the posterior portal and brought a large round bur into the lateral portal and finished the lateral aspect of the subacromial decompression.   Next, I went ahead and reinserted the scope into the glenohumeral joint. I established an anterior portal from inside out. I inserted a trocar through the cannula and used it to manipulate the biceps tendon. There was some erythematous change to the tendon more laterally.   I went ahead and decided to release the long head of biceps tendon since there were some reactive changes noted arthroscopically and the  MRI was consistent with significant tendinosis.   I went ahead and inserted a Saber ArthroCare wand through the anterior portal and used it to divide the attachment of the biceps tendon to the  labrum. After the long head of the biceps tendon was released, I went ahead and removed the cannulas. The puncture wounds were closed with 3-0 nylon in vertical mattress fashion. Several mL of 0.5% Marcaine without epinephrine were injected about each puncture wound.   Betadine was applied to the wounds followed by a sterile dressing. I then applied four TENS pads about the shoulder. A sling was applied to the patient's left upper extremity and then she was turned supine and awakened. She was then transferred to her stretcher bed.    She was taken to the recovery room in satisfactory condition. Blood loss was negligible.   ____________________________ Alda Berthold., MD hbk:drc D: 03/29/2012 18:13:21 ET T: 03/30/2012 09:22:24 ET JOB#: [id number]  cc: Alda Berthold., MD, <Dictator> Randon Goldsmith, Montez Hageman MD ELECTRONICALLY SIGNED 03/31/2012 19:11

## 2015-03-12 ENCOUNTER — Encounter: Payer: Self-pay | Admitting: Internal Medicine

## 2015-03-12 DIAGNOSIS — E669 Obesity, unspecified: Secondary | ICD-10-CM

## 2015-03-12 NOTE — Telephone Encounter (Signed)
No new message was sent from our office. No referral order placed. Please advise

## 2015-03-12 NOTE — Telephone Encounter (Signed)
Order placed for the referral to Dr Smitty CordsBruce.

## 2015-03-27 ENCOUNTER — Ambulatory Visit (INDEPENDENT_AMBULATORY_CARE_PROVIDER_SITE_OTHER): Payer: Commercial Managed Care - PPO | Admitting: *Deleted

## 2015-03-27 DIAGNOSIS — Z308 Encounter for other contraceptive management: Secondary | ICD-10-CM

## 2015-03-27 MED ORDER — MEDROXYPROGESTERONE ACETATE 150 MG/ML IM SUSP
150.0000 mg | Freq: Once | INTRAMUSCULAR | Status: AC
  Administered 2015-03-27: 150 mg via INTRAMUSCULAR

## 2015-04-04 ENCOUNTER — Encounter: Payer: Self-pay | Admitting: Internal Medicine

## 2015-04-05 ENCOUNTER — Encounter: Payer: Self-pay | Admitting: Nurse Practitioner

## 2015-04-05 ENCOUNTER — Ambulatory Visit (INDEPENDENT_AMBULATORY_CARE_PROVIDER_SITE_OTHER): Payer: Commercial Managed Care - PPO | Admitting: Nurse Practitioner

## 2015-04-05 VITALS — BP 110/80 | HR 100 | Temp 99.0°F | Resp 14 | Ht 67.0 in | Wt 338.8 lb

## 2015-04-05 DIAGNOSIS — K668 Other specified disorders of peritoneum: Secondary | ICD-10-CM | POA: Diagnosis not present

## 2015-04-05 DIAGNOSIS — IMO0002 Reserved for concepts with insufficient information to code with codable children: Secondary | ICD-10-CM

## 2015-04-05 MED ORDER — DOXYCYCLINE HYCLATE 100 MG PO TABS: 100.0000 mg | ORAL_TABLET | Freq: Two times a day (BID) | ORAL | Status: DC

## 2015-04-05 NOTE — Patient Instructions (Addendum)
Follow up in 3 weeks to see how they are healing. Come in sooner if worsening.   Keep doing your home care regimen.   Doxycyline twice daily for 10 days- wear sunscreen when outside can cause sunburn  Please take a probiotic ( Align, Floraque or Culturelle) while you are on the antibiotic to prevent a serious antibiotic associated diarrhea  Called clostirudium dificile colitis and a vaginal yeast infection.

## 2015-04-05 NOTE — Telephone Encounter (Signed)
Spoke to pt, advised Dr. Lorin PicketScott out of the office today. Scheduled appt with Lyla Sonarrie at 2:30 today

## 2015-04-05 NOTE — Progress Notes (Signed)
   Subjective:    Patient ID: Erica Hernandez, female    DOB: [birth year], 39 y.o.   MRN: [id number]  HPI  Erica Hernandez is a 39 yo female with a CC of 6 "cysts" on waistline with drainage.   1) Waistline drainage with "6 cysts"  Allergy to bandage adhesive Recurring frequently  99-100 BS recently at home   Keeping clean bandages, using boil ease   Wt Readings from Last 3 Encounters:  04/05/15 338 lb 12.8 oz (153.679 kg)  03/02/15 345 lb 3.2 oz (156.582 kg)  01/09/15 338 lb 8 oz (153.543 kg)   Review of Systems  Constitutional: Negative for fever, chills, diaphoresis and fatigue.  Skin: Positive for wound.      Objective:   Physical Exam  Constitutional: She is oriented to person, place, and time. She appears well-developed and well-nourished. No distress.  BP 110/80 mmHg  Pulse 100  Temp(Src) 99 F (37.2 C) (Oral)  Resp 14  Ht 5\' 7"  (1.702 m)  Wt 338 lb 12.8 oz (153.679 kg)  BMI 53.05 kg/m2  SpO2 98%   HENT:  Head: Normocephalic and atraumatic.  Right Ear: External ear normal.  Left Ear: External ear normal.  Cardiovascular: Normal rate, regular rhythm and normal heart sounds.  Exam reveals no gallop and no friction rub.   No murmur heard. Pulmonary/Chest: Effort normal and breath sounds normal. No respiratory distress. She has no wheezes. She has no rales. She exhibits no tenderness.  Neurological: She is alert and oriented to person, place, and time. No cranial nerve deficit. She exhibits normal muscle tone. Coordination normal.  Skin: Skin is warm and dry. No rash noted. She is not diaphoretic.     1-2 cm in diameter, some draining, scarred from multiple boils worsening and resolving. Drainage is serosanguinous fluid, none draining purulent drainage  Psychiatric: She has a normal mood and affect. Her behavior is normal. Judgment and thought content normal.      Assessment & Plan:

## 2015-04-05 NOTE — Progress Notes (Signed)
Pre visit review using our clinic review tool, if applicable. No additional management support is needed unless otherwise documented below in the visit note. 

## 2015-04-08 LAB — WOUND CULTURE
GRAM STAIN: NONE SEEN
Gram Stain: NONE SEEN
ORGANISM ID, BACTERIA: NO GROWTH

## 2015-04-23 DIAGNOSIS — IMO0002 Reserved for concepts with insufficient information to code with codable children: Secondary | ICD-10-CM | POA: Insufficient documentation

## 2015-04-23 NOTE — Assessment & Plan Note (Addendum)
Cysts of skin around waistline. Recurrent. Cultured one that was draining actively. Placed pt on Doxycyline and she has bactroban at home. 3 were redressed with a small amount of telfa and tegaderm.

## 2015-04-26 ENCOUNTER — Ambulatory Visit (INDEPENDENT_AMBULATORY_CARE_PROVIDER_SITE_OTHER): Payer: Commercial Managed Care - PPO | Admitting: Nurse Practitioner

## 2015-04-26 VITALS — BP 116/82 | HR 90 | Temp 98.6°F | Resp 16 | Ht 67.0 in | Wt 343.8 lb

## 2015-04-26 DIAGNOSIS — H9203 Otalgia, bilateral: Secondary | ICD-10-CM | POA: Diagnosis not present

## 2015-04-26 DIAGNOSIS — K668 Other specified disorders of peritoneum: Secondary | ICD-10-CM

## 2015-04-26 DIAGNOSIS — IMO0002 Reserved for concepts with insufficient information to code with codable children: Secondary | ICD-10-CM

## 2015-04-26 MED ORDER — METHYLPREDNISOLONE 4 MG PO TABS: ORAL_TABLET | ORAL | Status: DC

## 2015-04-26 NOTE — Patient Instructions (Signed)
Prednisone with breakfast  6 tablets on day 1, 5 tablets on day 2, 4 tablets on day 3, 3 tablets on day 4, 2 tablets day 5, 1 tablet on day 6...done!   We will contact you about your referral to Dermatology.

## 2015-04-26 NOTE — Progress Notes (Signed)
   Subjective:    Patient ID: Erica Hernandez, female    DOB: [birth year], 39 y.o.   MRN: [id number]  HPI  Erica Hernandez is a 39 yo female here for a follow up on cysts.   1) Improved until yesterday, boil ease- came to a head this morning, mupirocin this morning, started draining over night.   2) Ear pain- bilateral equal in pain, few days, tylenol at home- not helpful   Review of Systems  Constitutional: Negative for fever, chills, diaphoresis and fatigue.  HENT: Positive for ear pain. Negative for congestion, ear discharge, postnasal drip, rhinorrhea and sinus pressure.   Respiratory: Negative for chest tightness, shortness of breath and wheezing.   Cardiovascular: Negative for chest pain, palpitations and leg swelling.  Gastrointestinal: Negative for nausea, vomiting and diarrhea.  Skin: Positive for color change and wound. Negative for rash.  Neurological: Negative for dizziness, weakness, numbness and headaches.  Psychiatric/Behavioral: The patient is not nervous/anxious.       Objective:   Physical Exam  Constitutional: She is oriented to person, place, and time. She appears well-developed and well-nourished. No distress.  BP 116/82 mmHg  Pulse 90  Temp(Src) 98.6 F (37 C)  Resp 16  Ht 5\' 7"  (1.702 m)  Wt 343 lb 12.8 oz (155.947 kg)  BMI 53.83 kg/m2  SpO2 98%   HENT:  Head: Normocephalic and atraumatic.  Right Ear: External ear normal.  Left Ear: External ear normal.  Cardiovascular: Normal rate, regular rhythm, normal heart sounds and intact distal pulses.  Exam reveals no gallop and no friction rub.   No murmur heard. Pulmonary/Chest: Effort normal and breath sounds normal. No respiratory distress. She has no wheezes. She has no rales. She exhibits no tenderness.  Neurological: She is alert and oriented to person, place, and time. No cranial nerve deficit. She exhibits normal muscle tone. Coordination normal.  Skin: Skin is warm and dry. She is not diaphoretic.  Past  sites have healed, 1 draining site left today   Psychiatric: She has a normal mood and affect. Her behavior is normal. Judgment and thought content normal.      Assessment & Plan:

## 2015-04-26 NOTE — Progress Notes (Signed)
Pre visit review using our clinic review tool, if applicable. No additional management support is needed unless otherwise documented below in the visit note. 

## 2015-04-30 ENCOUNTER — Encounter: Payer: Self-pay | Admitting: Nurse Practitioner

## 2015-04-30 NOTE — Assessment & Plan Note (Signed)
Prednisone taper given. Probable ETD. FU prn worsening/failure to improve.

## 2015-04-30 NOTE — Assessment & Plan Note (Signed)
Referral to dermatology placed. Discussed weight loss with pt and ways to keep skin folds dry.

## 2015-05-31 ENCOUNTER — Other Ambulatory Visit: Payer: Self-pay | Admitting: Internal Medicine

## 2015-06-12 ENCOUNTER — Ambulatory Visit (INDEPENDENT_AMBULATORY_CARE_PROVIDER_SITE_OTHER): Payer: Commercial Managed Care - PPO

## 2015-06-12 DIAGNOSIS — Z308 Encounter for other contraceptive management: Secondary | ICD-10-CM

## 2015-06-12 MED ORDER — MEDROXYPROGESTERONE ACETATE 150 MG/ML IM SUSP
150.0000 mg | Freq: Once | INTRAMUSCULAR | Status: AC
  Administered 2015-06-12: 150 mg via INTRAMUSCULAR

## 2015-06-12 NOTE — Progress Notes (Signed)
Patient came in for Depo shot.  Patient verified DOB and RN gave shot into R arm.  After giving shot, RN realized that wrong drug was given to patient.  Patient received Depo-Medrol.  RN then gave correct medroxyprogestrone acetate to patient in the left deltoid.  Discussed error with the patient and notified the MD.  Patient had not s/s of the error.  Expressed to her to call the office if any s/s noted.  Scheduled next injection per the Depo schedule given to the patient.

## 2015-07-04 ENCOUNTER — Encounter: Payer: Self-pay | Admitting: Internal Medicine

## 2015-07-04 ENCOUNTER — Other Ambulatory Visit: Payer: Self-pay | Admitting: *Deleted

## 2015-07-04 ENCOUNTER — Ambulatory Visit (INDEPENDENT_AMBULATORY_CARE_PROVIDER_SITE_OTHER): Payer: Commercial Managed Care - PPO | Admitting: Internal Medicine

## 2015-07-04 VITALS — BP 123/81 | HR 80 | Temp 98.3°F | Ht 67.0 in | Wt 340.2 lb

## 2015-07-04 DIAGNOSIS — I10 Essential (primary) hypertension: Secondary | ICD-10-CM

## 2015-07-04 DIAGNOSIS — H9203 Otalgia, bilateral: Secondary | ICD-10-CM | POA: Diagnosis not present

## 2015-07-04 DIAGNOSIS — F439 Reaction to severe stress, unspecified: Secondary | ICD-10-CM

## 2015-07-04 DIAGNOSIS — Z658 Other specified problems related to psychosocial circumstances: Secondary | ICD-10-CM

## 2015-07-04 LAB — BASIC METABOLIC PANEL
BUN: 9 mg/dL (ref 6–23)
CO2: 25 mEq/L (ref 19–32)
CREATININE: 0.83 mg/dL (ref 0.40–1.20)
Calcium: 9.3 mg/dL (ref 8.4–10.5)
Chloride: 106 mEq/L (ref 96–112)
GFR: 82.13 mL/min (ref >60.00)
Glucose, Bld: 74 mg/dL (ref 70–99)
Potassium: 4.1 mEq/L (ref 3.5–5.1)
Sodium: 140 mEq/L (ref 135–145)

## 2015-07-04 MED ORDER — CEPHALEXIN 500 MG PO CAPS: 500.0000 mg | ORAL_CAPSULE | Freq: Three times a day (TID) | ORAL | Status: DC

## 2015-07-04 NOTE — Progress Notes (Signed)
Patient ID: SARAHLYNN CISNERO, female   DOB: 06/18/1976, 39 y.o.   MRN: [id number]   Subjective:    Patient ID: [name], female    DOB: [birth year], 39 y.o.   MRN: [id number]  HPI  Patient here for a scheduled follow up.  She reports some increased left ear fullness.  Some congestion.  No sore throat.  No chest congestion.  No sob.  Some increased stress.  Discussed with her today.  She does not feel she needs any further intervention at this point.  No acid reflux.  No abdominal pain or cramping.  Bowels stable.  Blood pressure doing well.     Past Medical History  Diagnosis Date  . Hypertension     during pregnancy   Past Surgical History  Procedure Laterality Date  . Shoulder surgery  03/30/11  . Tonsillectomy  1984   Family History  Problem Relation Age of Onset  . Breast cancer      grandmother  . Hypertension Father   . Diabetes      parent  . Breast cancer Neg Hx   . Colon cancer Neg Hx    Social History   Social History  . Marital Status: Married    Spouse Name: N/A  . Number of Children: 1  . Years of Education: N/A   Occupational History  .     Social History Main Topics  . Smoking status: Never Smoker   . Smokeless tobacco: Never Used  . Alcohol Use: No  . Drug Use: No  . Sexual Activity: Not Asked   Other Topics Concern  . None   Social History Narrative    Outpatient Encounter Prescriptions as of 07/04/2015  Medication Sig  . cetirizine (ZYRTEC) 10 MG tablet Take 10 mg by mouth daily.  Marland Kitchen lisinopril (PRINIVIL,ZESTRIL) 10 MG tablet Take 1 tablet by mouth  daily  . meloxicam (MOBIC) 15 MG tablet Take 15 mg by mouth as needed.   . pramoxine-hydrocortisone (ANALPRAM-HC) 1-1 % rectal cream Place 1 application rectally 2 (two) times daily.  . sertraline (ZOLOFT) 50 MG tablet Take 75 mg by mouth daily.  . [DISCONTINUED] doxycycline (VIBRA-TABS) 100 MG tablet Take 1 tablet (100 mg total) by mouth 2 (two) times daily.  . [DISCONTINUED]  methylPREDNISolone (MEDROL) 4 MG tablet Take 6 tablets by mouth with breakfast or lunch and decrease by 1 tablet each day until gone.  . cephALEXin (KEFLEX) 500 MG capsule Take 1 capsule (500 mg total) by mouth 3 (three) times daily.  . [DISCONTINUED] cephALEXin (KEFLEX) 500 MG capsule Take 1 capsule (500 mg total) by mouth 3 (three) times daily.   No facility-administered encounter medications on file as of 07/04/2015.    Review of Systems  Constitutional: Positive for fatigue. Negative for appetite change and unexpected weight change.  HENT: Positive for congestion. Negative for sinus pressure.        Left ear fullness.   Eyes: Negative for discharge and visual disturbance.  Respiratory: Negative for cough, chest tightness and shortness of breath.   Cardiovascular: Negative for chest pain, palpitations and leg swelling.  Gastrointestinal: Negative for nausea, vomiting, abdominal pain and diarrhea.  Genitourinary: Negative for dysuria and difficulty urinating.  Musculoskeletal: Negative for back pain and joint swelling.  Skin: Negative for color change and rash.  Neurological: Negative for dizziness, light-headedness and headaches.  Hematological: Negative for adenopathy. Does not bruise/bleed easily.  Psychiatric/Behavioral: Negative for dysphoric mood and agitation.  Objective:     Blood pressure recheck:  11/78  Physical Exam  Constitutional: She appears well-developed and well-nourished. No distress.  HENT:  Nose: Nose normal.  Mouth/Throat: Oropharynx is clear and moist.  Left ear - no erythema.    Eyes: Conjunctivae are normal. Right eye exhibits no discharge. Left eye exhibits no discharge.  Neck: Neck supple. No thyromegaly present.  Cardiovascular: Normal rate and regular rhythm.   Pulmonary/Chest: Breath sounds normal. No respiratory distress. She has no wheezes.  Abdominal: Soft. Bowel sounds are normal. There is no tenderness.  Musculoskeletal: She exhibits no  edema or tenderness.  Lymphadenopathy:    She has no cervical adenopathy.  Skin: No rash noted. No erythema.  Psychiatric: She has a normal mood and affect. Her behavior is normal.    BP 123/81 mmHg  Pulse 80  Temp(Src) 98.3 F (36.8 C) (Oral)  Ht  (1.702 m)  Wt 340 lb 4 oz (154.336 kg)  BMI 53.28 kg/m2  SpO2 97% Wt Readings from Last 3 Encounters:  07/04/15 340 lb 4 oz (154.336 kg)  04/26/15 343 lb 12.8 oz (155.947 kg)  04/05/15 338 lb 12.8 oz (153.679 kg)     Lab Results  Component Value Date   WBC 7.3 03/20/2014   HGB 13.0 03/20/2014   HCT 39.1 03/20/2014   PLT 236.0 03/20/2014   GLUCOSE 74 07/04/2015   CHOL 155 03/20/2014   TRIG 56.0 03/20/2014   HDL 49.70 03/20/2014   LDLCALC 94 03/20/2014   ALT 22 03/20/2014   AST 22 03/20/2014   NA 140 07/04/2015   K 4.1 07/04/2015   CL 106 07/04/2015   CREATININE 0.83 07/04/2015   BUN 9 07/04/2015   CO2 25 07/04/2015   TSH 2.13 03/20/2014       Assessment & Plan:   Problem List Items Addressed This Visit    Ear pain    Some fullness and congestion.  nasacort nasal spray and saline nasal spray as directed.  Keflex given for skin lesion.  Should if any infection.  Follow.        Hypertension, essential - Primary    Blood pressure under good control.  Continue same medication regimen.  Follow pressures.  Follow metabolic panel.        Relevant Orders   Basic metabolic panel (Completed)   Obesity, morbid, BMI 50 or higher    Diet and exercise.  Follow.        Stress    On zoloft.  Desires no further intervention.  Follow.            Dale Homer, MD

## 2015-07-04 NOTE — Patient Instructions (Addendum)
Afrin nasal spray - 2 sprays each nostril one time per day.  Do this in the evening.   flonase nasal spray - 2 sprays each nostril one time per day.  Do this in the evening.    Take a probiotic (align) - one per day while on the antibiotics and for two weeks after completing the antibiotics.

## 2015-07-04 NOTE — Progress Notes (Signed)
Pre-visit discussion using our clinic review tool. No additional management support is needed unless otherwise documented below in the visit note.  

## 2015-07-08 ENCOUNTER — Encounter: Payer: Self-pay | Admitting: Internal Medicine

## 2015-07-08 NOTE — Assessment & Plan Note (Signed)
Blood pressure under good control.  Continue same medication regimen.  Follow pressures.  Follow metabolic panel.   

## 2015-07-08 NOTE — Assessment & Plan Note (Signed)
Diet and exercise.  Follow.  

## 2015-07-08 NOTE — Assessment & Plan Note (Signed)
Some fullness and congestion.  nasacort nasal spray and saline nasal spray as directed.  Keflex given for skin lesion.  Should if any infection.  Follow.

## 2015-07-08 NOTE — Assessment & Plan Note (Signed)
On zoloft.  Desires no further intervention.  Follow.

## 2015-07-11 ENCOUNTER — Encounter: Payer: Self-pay | Admitting: Internal Medicine

## 2015-07-11 DIAGNOSIS — R21 Rash and other nonspecific skin eruption: Secondary | ICD-10-CM

## 2015-07-11 NOTE — Telephone Encounter (Signed)
Order placed for dermatology referral.  

## 2015-07-29 ENCOUNTER — Other Ambulatory Visit: Payer: Self-pay | Admitting: Internal Medicine

## 2015-07-30 NOTE — Telephone Encounter (Signed)
rx sent in for zoloft #135 with one refill.  °

## 2015-07-30 NOTE — Telephone Encounter (Signed)
Last OV 8.24.16.  It does not appear you have refilled this before.  Please advise refill.

## 2015-09-11 ENCOUNTER — Ambulatory Visit (INDEPENDENT_AMBULATORY_CARE_PROVIDER_SITE_OTHER): Payer: Commercial Managed Care - PPO

## 2015-09-11 DIAGNOSIS — Z23 Encounter for immunization: Secondary | ICD-10-CM

## 2015-09-11 DIAGNOSIS — Z308 Encounter for other contraceptive management: Secondary | ICD-10-CM

## 2015-09-11 MED ORDER — MEDROXYPROGESTERONE ACETATE 150 MG/ML IM SUSP
150.0000 mg | Freq: Once | INTRAMUSCULAR | Status: AC
  Administered 2015-09-11: 150 mg via INTRAMUSCULAR

## 2015-09-11 NOTE — Progress Notes (Signed)
Patient came in for depo injection.  Received in right deltoid.  Patient tolerated well.  Patient also received flu vaccine

## 2015-10-08 ENCOUNTER — Ambulatory Visit (INDEPENDENT_AMBULATORY_CARE_PROVIDER_SITE_OTHER): Payer: Commercial Managed Care - PPO | Admitting: Internal Medicine

## 2015-10-08 ENCOUNTER — Encounter: Payer: Self-pay | Admitting: Internal Medicine

## 2015-10-08 VITALS — BP 110/80 | HR 84 | Temp 98.3°F | Resp 18 | Ht 67.25 in | Wt 344.0 lb

## 2015-10-08 DIAGNOSIS — Z0000 Encounter for general adult medical examination without abnormal findings: Secondary | ICD-10-CM

## 2015-10-08 DIAGNOSIS — Z658 Other specified problems related to psychosocial circumstances: Secondary | ICD-10-CM

## 2015-10-08 DIAGNOSIS — J329 Chronic sinusitis, unspecified: Secondary | ICD-10-CM | POA: Diagnosis not present

## 2015-10-08 DIAGNOSIS — N649 Disorder of breast, unspecified: Secondary | ICD-10-CM

## 2015-10-08 DIAGNOSIS — I10 Essential (primary) hypertension: Secondary | ICD-10-CM

## 2015-10-08 DIAGNOSIS — J029 Acute pharyngitis, unspecified: Secondary | ICD-10-CM

## 2015-10-08 DIAGNOSIS — F439 Reaction to severe stress, unspecified: Secondary | ICD-10-CM

## 2015-10-08 LAB — CBC WITH DIFFERENTIAL/PLATELET
BASOS PCT: 0.2 % (ref 0.0–3.0)
Basophils Absolute: 0 10*3/uL (ref 0.0–0.1)
EOS PCT: 2.7 % (ref 0.0–5.0)
Eosinophils Absolute: 0.3 10*3/uL (ref 0.0–0.7)
HCT: 40.6 % (ref 36.0–46.0)
HEMOGLOBIN: 13 g/dL (ref 12.0–15.0)
LYMPHS ABS: 2.5 10*3/uL (ref 0.7–4.0)
LYMPHS PCT: 26.3 % (ref 12.0–46.0)
MCHC: 32 g/dL (ref 30.0–36.0)
MCV: 83.1 fl (ref 78.0–100.0)
MONO ABS: 0.6 10*3/uL (ref 0.1–1.0)
MONOS PCT: 5.9 % (ref 3.0–12.0)
Neutro Abs: 6.2 10*3/uL (ref 1.4–7.7)
Neutrophils Relative %: 64.9 % (ref 43.0–77.0)
Platelets: 279 10*3/uL (ref 150.0–400.0)
RBC: 4.88 Mil/uL (ref 3.87–5.11)
RDW: 16.3 % — AB (ref 11.5–15.5)
WBC: 9.6 10*3/uL (ref 4.0–10.5)

## 2015-10-08 LAB — COMPREHENSIVE METABOLIC PANEL
ALBUMIN: 4.2 g/dL (ref 3.5–5.2)
ALK PHOS: 74 U/L (ref 39–117)
ALT: 28 U/L (ref 0–35)
AST: 24 U/L (ref 0–37)
BUN: 9 mg/dL (ref 6–23)
CHLORIDE: 105 meq/L (ref 96–112)
CO2: 25 mEq/L (ref 19–32)
Calcium: 9.4 mg/dL (ref 8.4–10.5)
Creatinine, Ser: 0.81 mg/dL (ref 0.40–1.20)
GFR: 84.36 mL/min (ref >60.00)
Glucose, Bld: 71 mg/dL (ref 70–99)
POTASSIUM: 4.1 meq/L (ref 3.5–5.1)
SODIUM: 139 meq/L (ref 135–145)
TOTAL PROTEIN: 6.9 g/dL (ref 6.0–8.3)
Total Bilirubin: 0.4 mg/dL (ref 0.2–1.2)

## 2015-10-08 LAB — TSH: TSH: 1.97 u[IU]/mL (ref 0.35–4.50)

## 2015-10-08 MED ORDER — DOXYCYCLINE HYCLATE 100 MG PO TABS: 100.0000 mg | ORAL_TABLET | Freq: Two times a day (BID) | ORAL | Status: DC

## 2015-10-08 MED ORDER — LISINOPRIL 10 MG PO TABS: ORAL_TABLET | ORAL | Status: DC

## 2015-10-08 MED ORDER — MUPIROCIN 2 % EX OINT: 1.0000 "application " | TOPICAL_OINTMENT | Freq: Two times a day (BID) | CUTANEOUS | Status: DC

## 2015-10-08 NOTE — Patient Instructions (Signed)
Saline nasal spray - flush nose at least 2-3x/day  mucinex in the am and robitussin in the evening.  nasacort nasal spray - 2 sprays each nostril one time per day.  Do this in the evening.

## 2015-10-08 NOTE — Progress Notes (Signed)
Pre-visit discussion using our clinic review tool. No additional management support is needed unless otherwise documented below in the visit note.  

## 2015-10-08 NOTE — Progress Notes (Signed)
Patient ID: Erica Hernandez, female   DOB: 03/22/1976, 39 y.o.   MRN: [id number]   Subjective:    Patient ID: Erica Hernandez, female    DOB: 1976-10-26, 39 y.o.   MRN: [id number]  HPI  Patient with history of hypertension and increased stress.  She comes in today to follow up on these issues as well as for a complete physical exam.  She reports that over the last two days, she has noticed headache, sore throat and ears hurting.  Some drainage.  No sob.  No chest congestion.  No nausea or vomiting.  Bowels stable.  Blood pressure has been doing well - states averaging 120/80. Took sudafed.  Did not help.  She also has a skin lesion on her left breast.  Some surrounding redness.     Past Medical History  Diagnosis Date  . Hypertension     during pregnancy   Past Surgical History  Procedure Laterality Date  . Shoulder surgery  03/30/11  . Tonsillectomy  1984   Family History  Problem Relation Age of Onset  . Breast cancer      grandmother  . Hypertension Father   . Diabetes      parent  . Breast cancer Neg Hx   . Colon cancer Neg Hx    Social History   Social History  . Marital Status: Married    Spouse Name: N/A  . Number of Children: 1  . Years of Education: N/A   Occupational History  .     Social History Main Topics  . Smoking status: Never Smoker   . Smokeless tobacco: Never Used  . Alcohol Use: No  . Drug Use: No  . Sexual Activity: Not Asked   Other Topics Concern  . None   Social History Narrative    Outpatient Encounter Prescriptions as of 10/08/2015  Medication Sig  . cetirizine (ZYRTEC) 10 MG tablet Take 10 mg by mouth daily.  Marland Kitchen lisinopril (PRINIVIL,ZESTRIL) 10 MG tablet Take 1 tablet by mouth  daily  . meloxicam (MOBIC) 15 MG tablet Take 15 mg by mouth as needed.   . pramoxine-hydrocortisone (ANALPRAM-HC) 1-1 % rectal cream Place 1 application rectally 2 (two) times daily.  . sertraline (ZOLOFT) 50 MG tablet Take 1 and 1/2 tablets by  mouth daily  .  [DISCONTINUED] cephALEXin (KEFLEX) 500 MG capsule Take 1 capsule (500 mg total) by mouth 3 (three) times daily.  . [DISCONTINUED] lisinopril (PRINIVIL,ZESTRIL) 10 MG tablet Take 1 tablet by mouth  daily  . doxycycline (VIBRA-TABS) 100 MG tablet Take 1 tablet (100 mg total) by mouth 2 (two) times daily.  . mupirocin ointment (BACTROBAN) 2 % Place 1 application into the nose 2 (two) times daily.   No facility-administered encounter medications on file as of 10/08/2015.    Review of Systems  Constitutional: Negative for appetite change and unexpected weight change.  HENT: Positive for postnasal drip and sore throat.        Ear fullness.    Eyes: Negative for pain and visual disturbance.  Respiratory: Negative for cough, chest tightness and shortness of breath.   Cardiovascular: Negative for chest pain, palpitations and leg swelling.  Gastrointestinal: Negative for nausea, vomiting, abdominal pain and diarrhea.  Genitourinary: Negative for dysuria and difficulty urinating.  Musculoskeletal: Negative for back pain and joint swelling.  Skin: Negative for color change.       Redness - left lateral breast.  Skin lesion.   Neurological: Positive for headaches (no  significant headache now.  ). Negative for dizziness and light-headedness.  Hematological: Negative for adenopathy. Does not bruise/bleed easily.  Psychiatric/Behavioral: Negative for dysphoric mood and agitation.       Objective:    Physical Exam  Constitutional: She is oriented to person, place, and time. She appears well-developed and well-nourished.  HENT:  Nose: Nose normal.  Mouth/Throat: Oropharynx is clear and moist.  Eyes: Right eye exhibits no discharge. Left eye exhibits no discharge. No scleral icterus.  Neck: Neck supple. No thyromegaly present.  Cardiovascular: Normal rate and regular rhythm.   Pulmonary/Chest: Breath sounds normal. No accessory muscle usage. No tachypnea. No respiratory distress. She has no  decreased breath sounds. She has no wheezes. She has no rhonchi. Right breast exhibits no inverted nipple, no mass, no nipple discharge and no tenderness (no axillary adenopathy). Left breast exhibits no inverted nipple, no mass, no nipple discharge and no tenderness (no axilarry adenopathy).  Superficial skin lesion with surrounding erythema - left lateral breast.    Abdominal: Soft. Bowel sounds are normal. There is no tenderness.  Musculoskeletal: She exhibits no edema or tenderness.  Lymphadenopathy:    She has no cervical adenopathy.  Neurological: She is alert and oriented to person, place, and time.  Skin: Skin is warm. No rash noted.  Psychiatric: She has a normal mood and affect. Her behavior is normal.    BP 110/80 mmHg  Pulse 84  Temp(Src) 98.3 F (36.8 C) (Oral)  Resp 18  Ht 5' 7.25" (1.708 m)  Wt 344 lb (156.037 kg)  BMI 53.49 kg/m2  SpO2 97% Wt Readings from Last 3 Encounters:  10/08/15 344 lb (156.037 kg)  07/04/15 340 lb 4 oz (154.336 kg)  04/26/15 343 lb 12.8 oz (155.947 kg)     Lab Results  Component Value Date   WBC 9.6 10/08/2015   HGB 13.0 10/08/2015   HCT 40.6 10/08/2015   PLT 279.0 10/08/2015   GLUCOSE 71 10/08/2015   CHOL 155 03/20/2014   TRIG 56.0 03/20/2014   HDL 49.70 03/20/2014   LDLCALC 94 03/20/2014   ALT 28 10/08/2015   AST 24 10/08/2015   NA 139 10/08/2015   K 4.1 10/08/2015   CL 105 10/08/2015   CREATININE 0.81 10/08/2015   BUN 9 10/08/2015   CO2 25 10/08/2015   TSH 1.97 10/08/2015       Assessment & Plan:   Problem List Items Addressed This Visit    Breast lesion    Some surrounding erythema as outlined.  Treat with doxycycline as directed.  Follow.        Health care maintenance    Physical today 10/08/15.  Mammogram 2015 - birads I.        Hypertension, essential    Blood pressure under good control.  Continue same medication regimen.  Follow pressures.  Follow metabolic panel.        Relevant Medications    lisinopril (PRINIVIL,ZESTRIL) 10 MG tablet   Other Relevant Orders   Comprehensive metabolic panel (Completed)   Throat culture ([facility]) (Completed)   Obesity, morbid, BMI 50 or higher (HCC)    Diet and exercise.  Follow.        Sinusitis - Primary    Symptoms and exam as outlined.  Have her use the nasacort nasal spray.  Robitussin as directed.  She will be on doxycycline for her breast.  Obtain throat culture.        Relevant Medications   doxycycline (VIBRA-TABS) 100 MG tablet  Other Relevant Orders   CBC with Differential/Platelet (Completed)   Throat culture Loney Loh([facility]) (Completed)   Stress    Increased stress.  On zoloft.  Follow.        Relevant Orders   TSH (Completed)   Throat culture Loney Loh([facility]) (Completed)    Other Visit Diagnoses    Acute pharyngitis, unspecified etiology        Relevant Orders    Throat culture Loney Loh([facility]) (Completed)        Dale DurhamSCOTT, Kemari Mares, MD

## 2015-10-09 ENCOUNTER — Encounter: Payer: Self-pay | Admitting: Internal Medicine

## 2015-10-10 ENCOUNTER — Encounter: Payer: Self-pay | Admitting: Internal Medicine

## 2015-10-10 LAB — CULTURE, GROUP A STREP: Organism ID, Bacteria: NORMAL

## 2015-10-14 ENCOUNTER — Encounter: Payer: Self-pay | Admitting: Internal Medicine

## 2015-10-14 DIAGNOSIS — N649 Disorder of breast, unspecified: Secondary | ICD-10-CM | POA: Insufficient documentation

## 2015-10-14 DIAGNOSIS — Z0000 Encounter for general adult medical examination without abnormal findings: Secondary | ICD-10-CM | POA: Insufficient documentation

## 2015-10-14 NOTE — Assessment & Plan Note (Signed)
Some surrounding erythema as outlined.  Treat with doxycycline as directed.  Follow.

## 2015-10-14 NOTE — Assessment & Plan Note (Signed)
Blood pressure under good control.  Continue same medication regimen.  Follow pressures.  Follow metabolic panel.   

## 2015-10-14 NOTE — Assessment & Plan Note (Signed)
Diet and exercise.  Follow.  

## 2015-10-14 NOTE — Assessment & Plan Note (Signed)
Increased stress.  On zoloft.  Follow.

## 2015-10-14 NOTE — Assessment & Plan Note (Signed)
Symptoms and exam as outlined.  Have her use the nasacort nasal spray.  Robitussin as directed.  She will be on doxycycline for her breast.  Obtain throat culture.

## 2015-10-14 NOTE — Assessment & Plan Note (Signed)
Physical today 10/08/15.  Mammogram 2015 - birads I.

## 2015-12-04 ENCOUNTER — Ambulatory Visit (INDEPENDENT_AMBULATORY_CARE_PROVIDER_SITE_OTHER): Payer: Commercial Managed Care - PPO

## 2015-12-04 DIAGNOSIS — Z308 Encounter for other contraceptive management: Secondary | ICD-10-CM | POA: Diagnosis not present

## 2015-12-04 MED ORDER — MEDROXYPROGESTERONE ACETATE 150 MG/ML IM SUSP
150.0000 mg | Freq: Once | INTRAMUSCULAR | Status: AC
  Administered 2015-12-04: 150 mg via INTRAMUSCULAR

## 2015-12-04 NOTE — Progress Notes (Signed)
Patient came in for depo provera injection.  Patient received in Left deltoid.  Gave calendar to schedule next injection.  Patient tolerated well.

## 2015-12-28 ENCOUNTER — Ambulatory Visit: Payer: Commercial Managed Care - PPO | Admitting: Internal Medicine

## 2016-01-01 ENCOUNTER — Ambulatory Visit (INDEPENDENT_AMBULATORY_CARE_PROVIDER_SITE_OTHER): Payer: Commercial Managed Care - PPO | Admitting: Internal Medicine

## 2016-01-01 ENCOUNTER — Encounter: Payer: Self-pay | Admitting: Internal Medicine

## 2016-01-01 VITALS — BP 122/80 | HR 81 | Temp 98.5°F | Resp 18 | Ht 67.25 in | Wt 344.5 lb

## 2016-01-01 DIAGNOSIS — Z658 Other specified problems related to psychosocial circumstances: Secondary | ICD-10-CM | POA: Diagnosis not present

## 2016-01-01 DIAGNOSIS — I10 Essential (primary) hypertension: Secondary | ICD-10-CM

## 2016-01-01 DIAGNOSIS — F439 Reaction to severe stress, unspecified: Secondary | ICD-10-CM

## 2016-01-01 NOTE — Progress Notes (Signed)
Pre-visit discussion using our clinic review tool. No additional management support is needed unless otherwise documented below in the visit note.  

## 2016-01-01 NOTE — Progress Notes (Signed)
Patient ID: Erica Hernandez, female   DOB: 10/31/1976, 40 y.o.   MRN: [id number]   Subjective:    Patient ID: Erica Hernandez, female    DOB: Aug 13, 1976, 40 y.o.   MRN: [id number]  HPI  Patient with past history of hypertension.  She comes in today for a scheduled follow up.  Some increased stress.  Her husband lost his job.  She feel she is handling things relatively well.  Desires no further intervention at this time.  Discussed diet and exercise.  No cardiac symptoms with increased activity or exertion.  No sob.  No acid reflux.  No abdominal pain or cramping.  Bowels stable.     Past Medical History  Diagnosis Date  . Hypertension     during pregnancy   Past Surgical History  Procedure Laterality Date  . Shoulder surgery  03/30/11  . Tonsillectomy  1984   Family History  Problem Relation Age of Onset  . Breast cancer      grandmother  . Hypertension Father   . Diabetes      parent  . Breast cancer Neg Hx   . Colon cancer Neg Hx    Social History   Social History  . Marital Status: Married    Spouse Name: N/A  . Number of Children: 1  . Years of Education: N/A   Occupational History  .     Social History Main Topics  . Smoking status: Never Smoker   . Smokeless tobacco: Never Used  . Alcohol Use: No  . Drug Use: No  . Sexual Activity: Not Asked   Other Topics Concern  . None   Social History Narrative    Outpatient Encounter Prescriptions as of 01/01/2016  Medication Sig  . cetirizine (ZYRTEC) 10 MG tablet Take 10 mg by mouth daily.  Marland Kitchen lisinopril (PRINIVIL,ZESTRIL) 10 MG tablet Take 1 tablet by mouth  daily  . meloxicam (MOBIC) 15 MG tablet Take 15 mg by mouth as needed.   . mupirocin ointment (BACTROBAN) 2 % Place 1 application into the nose 2 (two) times daily.  . pramoxine-hydrocortisone (ANALPRAM-HC) 1-1 % rectal cream Place 1 application rectally 2 (two) times daily.  . sertraline (ZOLOFT) 50 MG tablet Take 1 and 1/2 tablets by  mouth daily  .  [DISCONTINUED] doxycycline (VIBRA-TABS) 100 MG tablet Take 1 tablet (100 mg total) by mouth 2 (two) times daily.   No facility-administered encounter medications on file as of 01/01/2016.    Review of Systems  Constitutional: Negative for appetite change and unexpected weight change.  HENT: Negative for congestion and sinus pressure.   Respiratory: Negative for cough, chest tightness and shortness of breath.   Cardiovascular: Negative for chest pain, palpitations and leg swelling.  Gastrointestinal: Negative for nausea, abdominal pain and diarrhea.  Genitourinary: Negative for dysuria and difficulty urinating.  Musculoskeletal: Negative for back pain and joint swelling.  Neurological: Negative for dizziness, light-headedness and headaches.  Psychiatric/Behavioral: Negative for dysphoric mood and agitation.       Objective:    Physical Exam  Constitutional: She appears well-developed and well-nourished. No distress.  HENT:  Nose: Nose normal.  Mouth/Throat: Oropharynx is clear and moist.  Neck: Neck supple. No thyromegaly present.  Cardiovascular: Normal rate and regular rhythm.   Pulmonary/Chest: Breath sounds normal. No respiratory distress. She has no wheezes.  Abdominal: Soft. Bowel sounds are normal. There is no tenderness.  Musculoskeletal: She exhibits no edema or tenderness.  Lymphadenopathy:    She has  no cervical adenopathy.  Skin: No rash noted. No erythema.  Psychiatric: She has a normal mood and affect. Her behavior is normal.    BP 122/80 mmHg  Pulse 81  Temp(Src) 98.5 F (36.9 C) (Oral)  Resp 18  Ht 5' 7.25" (1.708 m)  Wt 344 lb 8 oz (156.264 kg)  BMI 53.57 kg/m2  SpO2 98% Wt Readings from Last 3 Encounters:  01/01/16 344 lb 8 oz (156.264 kg)  10/08/15 344 lb (156.037 kg)  07/04/15 340 lb 4 oz (154.336 kg)     Lab Results  Component Value Date   WBC 9.6 10/08/2015   HGB 13.0 10/08/2015   HCT 40.6 10/08/2015   PLT 279.0 10/08/2015   GLUCOSE 71  10/08/2015   CHOL 155 03/20/2014   TRIG 56.0 03/20/2014   HDL 49.70 03/20/2014   LDLCALC 94 03/20/2014   ALT 28 10/08/2015   AST 24 10/08/2015   NA 139 10/08/2015   K 4.1 10/08/2015   CL 105 10/08/2015   CREATININE 0.81 10/08/2015   BUN 9 10/08/2015   CO2 25 10/08/2015   TSH 1.97 10/08/2015       Assessment & Plan:   Problem List Items Addressed This Visit    Hypertension, essential - Primary    Blood pressure under good control.  Continue same medication regimen.  Follow pressures.  Follow metabolic panel.        Obesity, morbid, BMI 50 or higher (HCC)    Diet and exercise.  Follow.        Stress    Increased stress.  She feels she is handling things relatively well.  Desires no further intervention.  Follow.            Dale Concord, MD

## 2016-01-09 ENCOUNTER — Ambulatory Visit: Payer: Commercial Managed Care - PPO | Admitting: Internal Medicine

## 2016-01-14 ENCOUNTER — Encounter: Payer: Self-pay | Admitting: Internal Medicine

## 2016-01-14 NOTE — Assessment & Plan Note (Signed)
Blood pressure under good control.  Continue same medication regimen.  Follow pressures.  Follow metabolic panel.   

## 2016-01-14 NOTE — Assessment & Plan Note (Signed)
Diet and exercise.  Follow.  

## 2016-01-14 NOTE — Assessment & Plan Note (Signed)
Increased stress.  She feels she is handling things relatively well.  Desires no further intervention.  Follow.

## 2016-01-29 ENCOUNTER — Encounter: Payer: Self-pay | Admitting: Internal Medicine

## 2016-02-14 ENCOUNTER — Other Ambulatory Visit: Payer: Self-pay | Admitting: Internal Medicine

## 2016-02-26 ENCOUNTER — Ambulatory Visit: Payer: Commercial Managed Care - PPO

## 2016-04-14 ENCOUNTER — Encounter: Payer: Self-pay | Admitting: Internal Medicine

## 2016-07-03 ENCOUNTER — Ambulatory Visit: Payer: Commercial Managed Care - PPO | Admitting: Internal Medicine

## 2016-10-21 ENCOUNTER — Ambulatory Visit (INDEPENDENT_AMBULATORY_CARE_PROVIDER_SITE_OTHER): Payer: Self-pay | Admitting: Family

## 2016-10-21 ENCOUNTER — Encounter: Payer: Self-pay | Admitting: Family

## 2016-10-21 VITALS — BP 118/90 | HR 104 | Temp 98.0°F | Ht 67.25 in | Wt 346.2 lb

## 2016-10-21 DIAGNOSIS — R0981 Nasal congestion: Secondary | ICD-10-CM

## 2016-10-21 MED ORDER — AMOXICILLIN 500 MG PO CAPS: 500.0000 mg | ORAL_CAPSULE | Freq: Two times a day (BID) | ORAL | 0 refills | Status: DC

## 2016-10-21 NOTE — Progress Notes (Signed)
Pre visit review using our clinic review tool, if applicable. No additional management support is needed unless otherwise documented below in the visit note. 

## 2016-10-21 NOTE — Patient Instructions (Signed)
Plain mucinex as noted below if congestion thick If thin,runny, stay on zrytec.  Increase intake of clear fluids. Congestion is best treated by hydration, when mucus is wetter, it is thinner, less sticky, and easier to expel from the body, either through coughing up drainage, or by blowing your nose.   Get plenty of rest.   Use saline nasal drops and blow your nose frequently. Run a humidifier at night and elevate the head of the bed. Vicks Vapor rub will help with congestion and cough. Steam showers and sinus massage for congestion.   Use Acetaminophen or Ibuprofen as needed for fever or pain. Avoid second hand smoke. Even the smallest exposure will worsen symptoms.   Over the counter medications you can try include Delsym for cough, a decongestant for congestion, and Mucinex or Robitussin as an expectorant. Be sure to just get the plain Mucinex or Robitussin that just has one medication (Guaifenesen). We don't recommend the combination products. Note, be sure to drink two glasses of water with each dose of Mucinex as the medication will not work well without adequate hydration.   You can also try a teaspoon of honey to see if this will help reduce cough. Throat lozenges can sometimes be beneficial as well.    This illness will typically last 7 - 10 days.   Please follow up with our clinic if you develop a fever greater than 101 F, symptoms worsen, or do not resolve in the next week.

## 2016-10-21 NOTE — Progress Notes (Signed)
Subjective:    Patient ID: Erica Hernandez, female    DOB: 22-Jan-1976, 40 y.o.   MRN: [id number]  CC: Erica Hernandez is a 40 y.o. female who presents today for an acute visit.    HPI: CC: dry cough, clear runny nasal congestion x 2 weeks, unchanged. Endorses sore throat, sinus pressure and 'makes me feel dizzy.'  Tried 4 days of keflex, zrytec, flonase, decongestants with no relief. No fever, chills, SOB, wheezing.   Nonsmoker      HISTORY:  Past Medical History:  Diagnosis Date  . Hypertension    during pregnancy   Past Surgical History:  Procedure Laterality Date  . SHOULDER SURGERY  03/30/11  . TONSILLECTOMY  1984   Family History  Problem Relation Age of Onset  . Breast cancer      grandmother  . Hypertension Father   . Diabetes      parent  . Breast cancer Neg Hx   . Colon cancer Neg Hx     Allergies: Sulfa antibiotics Current Outpatient Prescriptions on File Prior to Visit  Medication Sig Dispense Refill  . cetirizine (ZYRTEC) 10 MG tablet Take 10 mg by mouth daily.    Marland Kitchen. lisinopril (PRINIVIL,ZESTRIL) 10 MG tablet Take 1 tablet by mouth  daily 90 tablet 3  . meloxicam (MOBIC) 15 MG tablet Take 15 mg by mouth as needed.     . mupirocin ointment (BACTROBAN) 2 % Place 1 application into the nose 2 (two) times daily. 22 g 1  . pramoxine-hydrocortisone (ANALPRAM-HC) 1-1 % rectal cream Place 1 application rectally 2 (two) times daily. 30 g 0  . sertraline (ZOLOFT) 50 MG tablet Take 1 and 1/2 tablets by  mouth daily 135 tablet 0   No current facility-administered medications on file prior to visit.     Social History  Substance Use Topics  . Smoking status: Never Smoker  . Smokeless tobacco: Never Used  . Alcohol use No    Review of Systems  Constitutional: Negative for chills and fever.  HENT: Positive for congestion, rhinorrhea and sinus pressure.   Respiratory: Positive for cough. Negative for shortness of breath and wheezing.   Cardiovascular: Negative  for chest pain and palpitations.  Gastrointestinal: Negative for nausea and vomiting.  Neurological: Positive for dizziness.      Objective:    BP 118/90   Pulse (!) 104   Temp 98 F (36.7 C) (Oral)   Ht 5' 7.25" (1.708 m)   Wt (!) 346 lb 3.2 oz (157 kg)   SpO2 98%   BMI 53.82 kg/m    Physical Exam  Constitutional: She appears well-developed and well-nourished.  HENT:  Head: Normocephalic and atraumatic.  Right Ear: Hearing, tympanic membrane, external ear and ear canal normal. No drainage, swelling or tenderness. No foreign bodies. Tympanic membrane is not erythematous and not bulging. No middle ear effusion. No decreased hearing is noted.  Left Ear: Hearing, tympanic membrane, external ear and ear canal normal. No drainage, swelling or tenderness. No foreign bodies. Tympanic membrane is not erythematous and not bulging.  No middle ear effusion. No decreased hearing is noted.  Nose: Rhinorrhea present. Right sinus exhibits no maxillary sinus tenderness and no frontal sinus tenderness. Left sinus exhibits no maxillary sinus tenderness and no frontal sinus tenderness.  Mouth/Throat: Uvula is midline, oropharynx is clear and moist and mucous membranes are normal. No oropharyngeal exudate, posterior oropharyngeal edema, posterior oropharyngeal erythema or tonsillar abscesses.  Eyes: Conjunctivae are normal.  Cardiovascular:  Regular rhythm, normal heart sounds and normal pulses.   Pulmonary/Chest: Effort normal and breath sounds normal. She has no wheezes. She has no rhonchi. She has no rales.  Lymphadenopathy:       Head (right side): No submental, no submandibular, no tonsillar, no preauricular, no posterior auricular and no occipital adenopathy present.       Head (left side): No submental, no submandibular, no tonsillar, no preauricular, no posterior auricular and no occipital adenopathy present.    She has no cervical adenopathy.  Neurological: She is alert.  Skin: Skin is warm and  dry.  Psychiatric: She has a normal mood and affect. Her speech is normal and behavior is normal. Thought content normal.  Vitals reviewed.      Assessment & Plan:  1. Sinus congestion Suspect viral v allergic etiology. We agreed on delayed antibiotic use. Advised patient to use conservative therapy for 1-2 days and if this is not improved symptoms, she will fill amoxicillin prescription. Return precautions given.  - amoxicillin (AMOXIL) 500 MG capsule; Take 1 capsule (500 mg total) by mouth 2 (two) times daily.  Dispense: 14 capsule; Refill: 0     I am having Ms. Stachnik maintain her meloxicam, cetirizine, pramoxine-hydrocortisone, lisinopril, mupirocin ointment, and sertraline.   No orders of the defined types were placed in this encounter.   Return precautions given.   Risks, benefits, and alternatives of the medications and treatment plan prescribed today were discussed, and patient expressed understanding.   Education regarding symptom management and diagnosis given to patient on AVS.  Continue to follow with Dale DurhamSCOTT, CHARLENE, MD for routine health maintenance.   Erica Hernandez and I agreed with plan.   Rennie PlowmanMargaret Arnett, FNP

## 2018-11-24 DIAGNOSIS — H10403 Unspecified chronic conjunctivitis, bilateral: Secondary | ICD-10-CM | POA: Insufficient documentation

## 2018-11-24 DIAGNOSIS — H5213 Myopia, bilateral: Secondary | ICD-10-CM | POA: Insufficient documentation

## 2018-11-24 DIAGNOSIS — H524 Presbyopia: Secondary | ICD-10-CM | POA: Insufficient documentation

## 2019-01-07 ENCOUNTER — Other Ambulatory Visit: Payer: Self-pay

## 2019-01-07 ENCOUNTER — Ambulatory Visit
Admission: EM | Admit: 2019-01-07 | Discharge: 2019-01-07 | Disposition: A | Discharge status: Home / Self Care | Payer: BLUE CROSS/BLUE SHIELD | Attending: Family Medicine | Admitting: Family Medicine

## 2019-01-07 DIAGNOSIS — M791 Myalgia, unspecified site: Secondary | ICD-10-CM

## 2019-01-07 DIAGNOSIS — R69 Illness, unspecified: Secondary | ICD-10-CM | POA: Diagnosis not present

## 2019-01-07 DIAGNOSIS — J111 Influenza due to unidentified influenza virus with other respiratory manifestations: Secondary | ICD-10-CM

## 2019-01-07 DIAGNOSIS — R0981 Nasal congestion: Secondary | ICD-10-CM

## 2019-01-07 DIAGNOSIS — R05 Cough: Secondary | ICD-10-CM

## 2019-01-07 MED ORDER — ACETAMINOPHEN 500 MG PO TABS
1000.0000 mg | ORAL_TABLET | Freq: Once | ORAL | Status: AC
  Administered 2019-01-07: 1000 mg via ORAL

## 2019-01-07 MED ORDER — BALOXAVIR MARBOXIL(80 MG DOSE) 2 X 40 MG PO TBPK: 80.0000 mg | ORAL_TABLET | Freq: Once | ORAL | 0 refills | Status: AC

## 2019-01-07 MED ORDER — BENZONATATE 200 MG PO CAPS: 200.0000 mg | ORAL_CAPSULE | Freq: Three times a day (TID) | ORAL | 0 refills | Status: DC | PRN

## 2019-01-07 NOTE — Discharge Instructions (Signed)
Take medication as prescribed. Rest. Drink plenty of fluids. Tylenol and ibuprofen as needed.  ° °Follow up with your primary care physician this week as needed. Return to Urgent care for new or worsening concerns.  ° °

## 2019-01-07 NOTE — ED Provider Notes (Signed)
[facility] ____________________________________________  Time seen: Approximately 7:20 PM  I have reviewed the triage vital signs and the nursing notes.   HISTORY  Chief Complaint Cough   HPI Erica Hernandez is a 43 y.o. female presenting for evaluation of quick onset of cough, congestion, chills, body aches starting today and worsening as the day has progressed.  Denies sore throat.  Overall is continued to eat and drink well but decreased appetite.  Has not taken any antipyretics just prior to arrival.  States generalized body aches.  Denies chest pain or shortness of breath.  States cough is hacking and nonproductive.  Denies recent sickness.  Reports otherwise doing well.  Dale [place], MD: PCP Patient's last menstrual period was 01/01/2019.  Denies pregnancy    Past Medical History:  Diagnosis Date  . Hypertension    during pregnancy    Patient Active Problem List   Diagnosis Date Noted  . Breast lesion 10/14/2015  . Health care maintenance 10/14/2015  . Abdominal cyst 04/23/2015  . Obesity, morbid, BMI 50 or higher (HCC) 01/14/2015  . Hypertension, essential 11/19/2014  . Ear pain 10/12/2014  . Stress 07/23/2014  . Fullness in head 07/23/2014  . Headache(784.0) 03/20/2014  . Rectal irritation 03/20/2014  . Sinusitis 01/15/2014    Past Surgical History:  Procedure Laterality Date  . SHOULDER SURGERY  03/30/11  . TONSILLECTOMY  1984     No current facility-administered medications for this encounter.   Current Outpatient Medications:  .  Baloxavir Marboxil,80 MG Dose, (XOFLUZA) 2 x 40 MG TBPK, Take 80 mg by mouth once for 1 dose., Disp: 2 each, Rfl: 0 .  benzonatate (TESSALON) 200 MG capsule, Take 1 capsule (200 mg total) by mouth 3 (three) times daily as needed for cough., Disp: 20 capsule, Rfl: 0  Allergies Sulfa antibiotics  Family History  Problem Relation Age of Onset  . Breast cancer Other        grandmother  . Hypertension Father    . Diabetes Other        parent  . Colon cancer Neg Hx     Social History Social History   Tobacco Use  . Smoking status: Never Smoker  . Smokeless tobacco: Never Used  Substance Use Topics  . Alcohol use: No    Alcohol/week: 0.0 standard drinks  . Drug use: No    Review of Systems Constitutional: Subjective fever ENT: No sore throat.  Positive nasal congestion Cardiovascular: Denies chest pain. Respiratory: Denies shortness of breath. Gastrointestinal: No abdominal pain.  Musculoskeletal: Negative for back pain. Skin: Negative for rash.   ____________________________________________   PHYSICAL EXAM:  VITAL SIGNS: ED Triage Vitals  Enc Vitals Group     BP 01/07/19 1612 (!) 143/99     Pulse Rate 01/07/19 1612 (!) 122     Resp 01/07/19 1612 18     Temp 01/07/19 1612 100 F (37.8 C)     Temp Source 01/07/19 1612 Oral     SpO2 01/07/19 1612 97 %     Weight 01/07/19 1613 (!) 350 lb (158.8 kg)     Height 01/07/19 1613 5\' 8"  (1.727 m)     Head Circumference --      Peak Flow --      Pain Score 01/07/19 1612 6     Pain Loc --      Pain Edu? --      Excl. in GC? --     Constitutional: Alert and oriented. Well appearing  and in no acute distress. Eyes: Conjunctivae are normal.  Head: Atraumatic. No sinus tenderness to palpation. No swelling. No erythema.  Ears: no erythema, normal TMs bilaterally.   Nose:Nasal congestion  Mouth/Throat: Mucous membranes are moist. No pharyngeal erythema. No tonsillar swelling or exudate.  Neck: No stridor.  No cervical spine tenderness to palpation. Hematological/Lymphatic/Immunilogical: No cervical lymphadenopathy. Cardiovascular: Normal rate, regular rhythm. Grossly normal heart sounds.  Good peripheral circulation. Respiratory: Normal respiratory effort.  No retractions. No wheezes, rales or rhonchi. Good air movement.  Musculoskeletal: Ambulatory with steady gait.  Neurologic:  Normal speech and language. No gait  instability. Skin:  Skin appears warm, dry and intact. No rash noted. Psychiatric: Mood and affect are normal. Speech and behavior are normal. ___________________________________________   LABS (all labs ordered are listed, but only abnormal results are displayed)  Labs Reviewed - No data to display ____________________________________________    PROCEDURES Procedures   INITIAL IMPRESSION / ASSESSMENT AND PLAN / ED COURSE  Pertinent labs & imaging results that were available during my care of the patient were reviewed by me and considered in my medical decision making (see chart for details).  Overall well-appearing patient.  No acute distress.  Suspect influenza-like illness.  Discussed treatment options with patient.  Will Rx Xofluza, PRN Tessalon Perles, over-the-counter Tylenol, ibuprofen, rest, fluids and supportive care. Discussed indication, risks and benefits of medications with patient.  Discussed follow up with Primary care physician this week. Discussed follow up and return parameters including no resolution or any worsening concerns. Patient verbalized understanding and agreed to plan.   ____________________________________________   FINAL CLINICAL IMPRESSION(S) / ED DIAGNOSES  Final diagnoses:  Influenza-like illness     ED Discharge Orders         Ordered    Baloxavir Marboxil,80 MG Dose, (XOFLUZA) 2 x 40 MG TBPK   Once     01/07/19 1714    benzonatate (TESSALON) 200 MG capsule  3 times daily PRN     01/07/19 1714           Note: This dictation was prepared with Dragon dictation along with smaller phrase technology. Any transcriptional errors that result from this process are unintentional.         Renford Dills, NP 01/07/19 2022

## 2019-01-07 NOTE — ED Triage Notes (Signed)
Patient complains of cough, fever and body aches that started suddenly this morning.

## 2019-01-20 ENCOUNTER — Other Ambulatory Visit: Payer: Self-pay

## 2019-01-20 ENCOUNTER — Ambulatory Visit (INDEPENDENT_AMBULATORY_CARE_PROVIDER_SITE_OTHER): Payer: BLUE CROSS/BLUE SHIELD | Admitting: Internal Medicine

## 2019-01-20 DIAGNOSIS — L989 Disorder of the skin and subcutaneous tissue, unspecified: Secondary | ICD-10-CM | POA: Diagnosis not present

## 2019-01-20 DIAGNOSIS — F439 Reaction to severe stress, unspecified: Secondary | ICD-10-CM | POA: Diagnosis not present

## 2019-01-22 ENCOUNTER — Encounter: Payer: Self-pay | Admitting: Internal Medicine

## 2019-01-22 DIAGNOSIS — L989 Disorder of the skin and subcutaneous tissue, unspecified: Secondary | ICD-10-CM | POA: Insufficient documentation

## 2019-01-22 MED ORDER — CEPHALEXIN 500 MG PO CAPS: 500.0000 mg | ORAL_CAPSULE | Freq: Three times a day (TID) | ORAL | 0 refills | Status: DC

## 2019-01-22 NOTE — Assessment & Plan Note (Signed)
Lesion on lower abdomen.  Can continue bactroban.  Keflex as directed.  Follow.

## 2019-01-22 NOTE — Assessment & Plan Note (Signed)
Diet and exercise.  Follow.  

## 2019-01-22 NOTE — Assessment & Plan Note (Signed)
Increased stress as outlined.  Discussed with her today.  She does not feel she needs any further intervention.  Follow.   

## 2019-01-22 NOTE — Progress Notes (Signed)
Patient ID: Erica Hernandez, female   DOB: Oct 17, 1976, 43 y.o.   MRN: [id number]   Subjective:    Patient ID: Erica Hernandez, female    DOB: Jul 02, 1976, 43 y.o.   MRN: [id number]  HPI  Patient here for a scheduled follow up.  I have not seen her since 12/2015.  She states she lost her insurance when her husband lost his job.  He had a job now.  States she has been doing well.  Saw Dr Smitty Cords.  Planning for gastric surgery.  Due to have ultrasound and cxr next week.  Also planning to have sleep study and cardiology evaluation.  Was evaluated two weeks ago.  Treated for flu.  Feeling better.  Does have some persistent cough, but overall better.  Taking delsym.  No chest pain.  No sob.  No acid reflux.  No abdominal pain.  Bowels moving.  No urine change.  Lesions - skin - lower abdomen.  Increased stress with her husband's health issues.  Also dealing with her grandmother's death. Discussed with her today.  Overall she feels she is doing relatively well.  Does not feel needs anything more at this time.     Past Medical History:  Diagnosis Date  . Hypertension    during pregnancy   Past Surgical History:  Procedure Laterality Date  . SHOULDER SURGERY  03/30/11  . TONSILLECTOMY  1984   Family History  Problem Relation Age of Onset  . Breast cancer Other        grandmother  . Hypertension Father   . Diabetes Other        parent  . Colon cancer Neg Hx    Social History   Socioeconomic History  . Marital status: Married    Spouse name: Not on file  . Number of children: 1  . Years of education: Not on file  . Highest education level: Not on file  Occupational History    Employer: [employer]  Social Needs  . Financial resource strain: Not on file  . Food insecurity:    Worry: Not on file    Inability: Not on file  . Transportation needs:    Medical: Not on file    Non-medical: Not on file  Tobacco Use  . Smoking status: Never Smoker  . Smokeless tobacco: Never Used  Substance  and Sexual Activity  . Alcohol use: No    Alcohol/week: 0.0 standard drinks  . Drug use: No  . Sexual activity: Not on file  Lifestyle  . Physical activity:    Days per week: Not on file    Minutes per session: Not on file  . Stress: Not on file  Relationships  . Social connections:    Talks on phone: Not on file    Gets together: Not on file    Attends religious service: Not on file    Active member of club or organization: Not on file    Attends meetings of clubs or organizations: Not on file    Relationship status: Not on file  Other Topics Concern  . Not on file  Social History Narrative  . Not on file    Outpatient Encounter Medications as of 01/20/2019  Medication Sig  . cephALEXin (KEFLEX) 500 MG capsule Take 1 capsule (500 mg total) by mouth 3 (three) times daily.  . [DISCONTINUED] benzonatate (TESSALON) 200 MG capsule Take 1 capsule (200 mg total) by mouth 3 (three) times daily as needed for cough.  No facility-administered encounter medications on file as of 01/20/2019.     Review of Systems  Constitutional: Negative for appetite change and unexpected weight change.  HENT: Negative for congestion and sinus pressure.   Respiratory: Negative for cough, chest tightness and shortness of breath.   Cardiovascular: Negative for chest pain, palpitations and leg swelling.  Gastrointestinal: Negative for abdominal pain, diarrhea, nausea and vomiting.  Genitourinary: Negative for difficulty urinating and dysuria.  Musculoskeletal: Negative for joint swelling and myalgias.  Skin: Negative for color change and rash.       Open lesion with surrounding erythema - left lower abdomen.    Neurological: Negative for dizziness, light-headedness and headaches.  Psychiatric/Behavioral: Negative for agitation and dysphoric mood.       Objective:    Physical Exam Constitutional:      General: She is not in acute distress.    Appearance: Normal appearance.  HENT:     Nose: Nose  normal. No congestion.     Mouth/Throat:     Pharynx: No oropharyngeal exudate or posterior oropharyngeal erythema.  Neck:     Musculoskeletal: Neck supple. No muscular tenderness.     Thyroid: No thyromegaly.  Cardiovascular:     Rate and Rhythm: Normal rate and regular rhythm.  Pulmonary:     Effort: No respiratory distress.     Breath sounds: Normal breath sounds. No wheezing.  Abdominal:     General: Bowel sounds are normal.     Palpations: Abdomen is soft.     Tenderness: There is no abdominal tenderness.  Musculoskeletal:        General: No swelling or tenderness.  Lymphadenopathy:     Cervical: No cervical adenopathy.  Skin:    Findings: No erythema or rash.     Comments: Open lesion with surrounding erythema - left lower abdomen.    Neurological:     Mental Status: She is alert.  Psychiatric:        Mood and Affect: Mood normal.        Behavior: Behavior normal.     BP 136/82   Pulse 84   Temp 97.8 F (36.6 C) (Oral)   Resp 16   Wt (!) 349 lb 3.2 oz (158.4 kg)   LMP 01/01/2019   SpO2 99%   BMI 53.10 kg/m  Wt Readings from Last 3 Encounters:  01/20/19 (!) 349 lb 3.2 oz (158.4 kg)  01/07/19 (!) 350 lb (158.8 kg)  10/21/16 (!) 346 lb 3.2 oz (157 kg)     Lab Results  Component Value Date   WBC 9.6 10/08/2015   HGB 13.0 10/08/2015   HCT 40.6 10/08/2015   PLT 279.0 10/08/2015   GLUCOSE 71 10/08/2015   CHOL 155 03/20/2014   TRIG 56.0 03/20/2014   HDL 49.70 03/20/2014   LDLCALC 94 03/20/2014   ALT 28 10/08/2015   AST 24 10/08/2015   NA 139 10/08/2015   K 4.1 10/08/2015   CL 105 10/08/2015   CREATININE 0.81 10/08/2015   BUN 9 10/08/2015   CO2 25 10/08/2015   TSH 1.97 10/08/2015       Assessment & Plan:   Problem List Items Addressed This Visit    Obesity, morbid, BMI 50 or higher (HCC)    Diet and exercise.  Follow.        Skin lesion    Lesion on lower abdomen.  Can continue bactroban.  Keflex as directed.  Follow.        Stress  Increased stress as outlined.  Discussed with her today.  She does not feel she needs any further intervention.  Follow.           Dale Farwell, MD

## 2019-01-24 ENCOUNTER — Telehealth: Payer: Self-pay

## 2019-01-24 DIAGNOSIS — Z48815 Encounter for surgical aftercare following surgery on the digestive system: Secondary | ICD-10-CM | POA: Diagnosis not present

## 2019-01-24 NOTE — Telephone Encounter (Signed)
The patient has been scheduled for 5.21.2020 @ 9:30.

## 2019-01-24 NOTE — Telephone Encounter (Signed)
Copied from CRM [id number]. Topic: Appointment Scheduling - Scheduling Inquiry for Clinic >> Jan 24, 2019  1:50 PM Angela Nevin wrote: Reason for CRM: Patient states she was advised to call back to schedule appointment for yearly cpe for 2 months out. No availability. Please contact pt to schedule.

## 2019-01-25 ENCOUNTER — Encounter: Payer: Self-pay | Admitting: Internal Medicine

## 2019-01-25 ENCOUNTER — Other Ambulatory Visit: Payer: Self-pay

## 2019-01-25 MED ORDER — CEPHALEXIN 500 MG PO CAPS: 500.0000 mg | ORAL_CAPSULE | Freq: Three times a day (TID) | ORAL | 0 refills | Status: DC

## 2019-01-27 ENCOUNTER — Encounter: Payer: Self-pay | Admitting: Internal Medicine

## 2019-01-27 NOTE — Telephone Encounter (Signed)
LMTCB

## 2019-01-28 ENCOUNTER — Ambulatory Visit: Payer: BLUE CROSS/BLUE SHIELD | Admitting: Internal Medicine

## 2019-01-28 MED ORDER — AZITHROMYCIN 250 MG PO TABS: ORAL_TABLET | ORAL | 0 refills | Status: DC

## 2019-01-28 NOTE — Telephone Encounter (Signed)
Spoke to pt.  She was recently diagnosed with flu.  Now with increasing cough and congestion.  Concern over URI.  Desires not to come in if does not have to.  Husband with recent hospitalization.  zpak sent in.  Continue delsym.  Call with update and let us know if any worsening problems or persistent cough.

## 2019-01-28 NOTE — Telephone Encounter (Signed)
Spoke with patient and scheduled an appointment for this afternoon. Patient does not want to come in if you feel that she can be treated over the phone or via my chart since she was just seen. She has not traveled anywhere since she got back from the [place] in November. Her and her family all had the flu about a month ago. Since then, she has had a deep persistent cough. No chills, body aches, head ache, nausea or vomiting. She did have a low grade fever last night of 99.5 but has not had one since. She is using the delsym and it is not helping. She was advised to use nasal spray but says she has not used it. She is concerned about whooping cough. Advised that with her just being seen, that was not mentioned and I would not be too concerned. Advised patient that I would send message over to you and see if there is something we could do with out her coming in but she does have an appointment with you if you would rather her come in.

## 2019-01-28 NOTE — Telephone Encounter (Signed)
Patient returned call and would like to call her on mobile # [phone]

## 2019-02-02 ENCOUNTER — Encounter: Payer: Self-pay | Admitting: Internal Medicine

## 2019-02-03 ENCOUNTER — Telehealth: Payer: Self-pay

## 2019-02-03 NOTE — Telephone Encounter (Signed)
Called pt to get update. She says that the abx did not work at all. She is not feeling worse but not any better. Still coughing. Confirmed no other acute symptoms. She is okay to do virtual visit if you prefer but was wondering if she will need to come in so you can listen to her lungs, etc. Advised patient that I would send message over for your advice

## 2019-02-03 NOTE — Telephone Encounter (Signed)
See my chart encounter sent to provider

## 2019-02-03 NOTE — Telephone Encounter (Signed)
Virtual visit scheduled.  

## 2019-02-03 NOTE — Telephone Encounter (Signed)
Copied from CRM [id number]. Topic: General - Inquiry >> Feb 03, 2019 10:29 AM Erica Hernandez wrote: Reason for CRM: Pt called in stating that the azithromycin (ZITHROMAX) 250 MG tablet did not help with symptoms and she is feeling the same as she felt before her appt/ please advise if she needs to be seen

## 2019-02-03 NOTE — Telephone Encounter (Signed)
If persistent symptoms, we can do a virtual visit if desires.

## 2019-02-04 ENCOUNTER — Encounter: Payer: Self-pay | Admitting: Internal Medicine

## 2019-02-04 ENCOUNTER — Ambulatory Visit (INDEPENDENT_AMBULATORY_CARE_PROVIDER_SITE_OTHER): Payer: BLUE CROSS/BLUE SHIELD | Admitting: Internal Medicine

## 2019-02-04 DIAGNOSIS — J069 Acute upper respiratory infection, unspecified: Secondary | ICD-10-CM

## 2019-02-04 MED ORDER — ALBUTEROL SULFATE HFA 108 (90 BASE) MCG/ACT IN AERS: 2.0000 | INHALATION_SPRAY | Freq: Four times a day (QID) | RESPIRATORY_TRACT | 0 refills | Status: DC | PRN

## 2019-02-04 MED ORDER — GUAIFENESIN-CODEINE 100-10 MG/5ML PO SYRP: 5.0000 mL | ORAL_SOLUTION | Freq: Two times a day (BID) | ORAL | 0 refills | Status: DC | PRN

## 2019-02-04 NOTE — Progress Notes (Addendum)
Virtual Visit via Video Note  I connected with Erica Hernandez on 02/04/19 at 11:00 AM EDT by a video enabled telemedicine application and verified that I am speaking with the correct person using two identifiers.  Location patient: home Location provider:work Persons participating in the virtual visit: patient, provider  I discussed the limitations of evaluation and management by telemedicine.   This visit type was conducted due to national recommendations for restrictions regarding the COVID-19 pandemic.  This format is felt to be most appropriate for this patient at this time.  The patient expressed understanding and agreed to proceed.   HPI: She was seen 01/07/19 for acute visit - for cough, congestion, chills and body aches.  Suspected influenza like illness.  Treated with xofluza and tessalon perles.  Daughter and husband treated for flu as well.  Recently called in with increasing cough and congestion.  Treated with zpak.  Completed zpak yesterday.  Still reports persistent cough.  No fever.  No sinus pressure.  Does have stuffy nose.  No sore throat.  No chest congestion, just persistent cough.  No sob.  Eating.  No vomiting.  Bowels moving.  Taking delsym and tylenol.     ROS: See pertinent positives and negatives per HPI.  Past Medical History:  Diagnosis Date  . Hypertension    during pregnancy    Past Surgical History:  Procedure Laterality Date  . SHOULDER SURGERY  03/30/11  . TONSILLECTOMY  1984    Family History  Problem Relation Age of Onset  . Breast cancer Other        grandmother  . Hypertension Father   . Diabetes Other        parent  . Colon cancer Neg Hx     SOCIAL HX: reviewed.    Current Outpatient Medications:  .  albuterol (PROVENTIL HFA;VENTOLIN HFA) 108 (90 Base) MCG/ACT inhaler, Inhale 2 puffs into the lungs every 6 (six) hours as needed for wheezing or shortness of breath., Disp: 1 Inhaler, Rfl: 0 .  guaiFENesin-codeine (CHERATUSSIN AC) 100-10 MG/5ML  syrup, Take 5 mLs by mouth 2 (two) times daily as needed for cough., Disp: 120 mL, Rfl: 0  EXAM:  GENERAL: alert, oriented, appears well and in no acute distress  HEENT: atraumatic, conjunttiva clear, no obvious abnormalities on inspection of external nose and ears  NECK: normal movements of the head and neck  LUNGS: on inspection no signs of respiratory distress, breathing rate appears normal, no obvious gross SOB, gasping or wheezing.  Increased cough with forced expiration.    CV: no obvious cyanosis  PSYCH/NEURO: pleasant and cooperative, no obvious depression or anxiety, speech and thought processing grossly intact  ASSESSMENT AND PLAN:  Discussed the following assessment and plan:  Upper respiratory tract infection, unspecified type     I discussed the assessment and treatment plan with the patient. The patient was provided an opportunity to ask questions and all were answered. The patient agreed with the plan and demonstrated an understanding of the instructions.   The patient was advised to call back or seek an in-person evaluation if the symptoms worsen or if the condition fails to improve as anticipated.  I provided 15 minutes of non-face-to-face time during this encounter.   Dale [place], MD

## 2019-02-06 ENCOUNTER — Encounter: Payer: Self-pay | Admitting: Internal Medicine

## 2019-02-06 DIAGNOSIS — J069 Acute upper respiratory infection, unspecified: Secondary | ICD-10-CM | POA: Insufficient documentation

## 2019-02-06 NOTE — Assessment & Plan Note (Signed)
Recently treated for flu and URI.  Just completed zpak yesterday.  Do not feel need for further abx at this time.  Continue robitussin DM as directed.  Persistent cough. Cheratussin and albuterol inhaler as directed.  Hold on prednisone.  Follow.  Call with update.

## 2019-02-08 DIAGNOSIS — I10 Essential (primary) hypertension: Secondary | ICD-10-CM | POA: Diagnosis not present

## 2019-02-27 ENCOUNTER — Other Ambulatory Visit: Payer: Self-pay | Admitting: Internal Medicine

## 2019-03-01 DIAGNOSIS — Z6841 Body Mass Index (BMI) 40.0 and over, adult: Secondary | ICD-10-CM | POA: Diagnosis not present

## 2019-03-01 DIAGNOSIS — I10 Essential (primary) hypertension: Secondary | ICD-10-CM | POA: Diagnosis not present

## 2019-03-22 DIAGNOSIS — Z713 Dietary counseling and surveillance: Secondary | ICD-10-CM | POA: Diagnosis not present

## 2019-03-22 LAB — PULMONARY FUNCTION TEST

## 2019-03-29 ENCOUNTER — Encounter: Payer: Self-pay | Admitting: Internal Medicine

## 2019-03-29 DIAGNOSIS — I159 Secondary hypertension, unspecified: Secondary | ICD-10-CM | POA: Diagnosis not present

## 2019-03-29 NOTE — Telephone Encounter (Signed)
Pt needs to be screened before coming in for her appt.  Also, see if she has the form.  Can do virtual visit if needed and can get form from pt.  Se me if questions before calling pt.

## 2019-03-29 NOTE — Telephone Encounter (Signed)
l °

## 2019-03-29 NOTE — Telephone Encounter (Signed)
Reviewed.  Will d/w her during f/u appt

## 2019-03-30 ENCOUNTER — Other Ambulatory Visit: Payer: Self-pay

## 2019-03-30 NOTE — Telephone Encounter (Signed)
Patient is wanting clearance signed at appointment for Bariatric surgery.

## 2019-03-31 ENCOUNTER — Encounter: Payer: Self-pay | Admitting: Internal Medicine

## 2019-03-31 ENCOUNTER — Ambulatory Visit (INDEPENDENT_AMBULATORY_CARE_PROVIDER_SITE_OTHER): Payer: BLUE CROSS/BLUE SHIELD | Admitting: Internal Medicine

## 2019-03-31 ENCOUNTER — Other Ambulatory Visit (HOSPITAL_COMMUNITY)
Admission: RE | Admit: 2019-03-31 | Discharge: 2019-03-31 | Disposition: A | Discharge status: Home / Self Care | Payer: BLUE CROSS/BLUE SHIELD | Source: Ambulatory Visit | Attending: Internal Medicine | Admitting: Internal Medicine

## 2019-03-31 VITALS — BP 136/80 | HR 86 | Temp 98.0°F | Ht 68.0 in | Wt 353.6 lb

## 2019-03-31 DIAGNOSIS — Z124 Encounter for screening for malignant neoplasm of cervix: Secondary | ICD-10-CM | POA: Diagnosis not present

## 2019-03-31 DIAGNOSIS — Z0000 Encounter for general adult medical examination without abnormal findings: Secondary | ICD-10-CM

## 2019-03-31 DIAGNOSIS — I10 Essential (primary) hypertension: Secondary | ICD-10-CM

## 2019-03-31 DIAGNOSIS — F439 Reaction to severe stress, unspecified: Secondary | ICD-10-CM

## 2019-03-31 DIAGNOSIS — Z1239 Encounter for other screening for malignant neoplasm of breast: Secondary | ICD-10-CM | POA: Diagnosis not present

## 2019-03-31 DIAGNOSIS — R87615 Unsatisfactory cytologic smear of cervix: Secondary | ICD-10-CM | POA: Diagnosis not present

## 2019-03-31 NOTE — Progress Notes (Signed)
Patient ID: Erica Hernandez, female   DOB: 1975/12/28, 43 y.o.   MRN: [id number]   Subjective:    Patient ID: Erica Hernandez, female    DOB: July 28, 1976, 43 y.o.   MRN: [id number]  HPI  Patient here for her scheduled physical.  States she is doing relatively well.  Planning for bariatric surgery.  Scheduled for EGD 04/11/19.  She is currently undergoing work up.  Had cxr - negative.  Ultrasound - hepatomegaly and hepatic steatosis.  Planning to f/u soon with surgery to discuss other w/up (including cardiac w/up, etc).  No sinus congestion.  No chest congestion cough.  No sob.  No chest pain.  No acid reflux.  No abdominal pain.  Bowels moving.  No vaginal problems.  Discussed the need for mammogram.  Increased stress.  Discussed with her today.  Does not feel she needs anything more at this time.      Past Medical History:  Diagnosis Date  . Hypertension    during pregnancy   Past Surgical History:  Procedure Laterality Date  . SHOULDER SURGERY  03/30/11  . TONSILLECTOMY  1984   Family History  Problem Relation Age of Onset  . Breast cancer Other        grandmother  . Hypertension Father   . Diabetes Other        parent  . Colon cancer Neg Hx    Social History   Socioeconomic History  . Marital status: Married    Spouse name: Not on file  . Number of children: 1  . Years of education: Not on file  . Highest education level: Not on file  Occupational History    Employer: [employer]  Social Needs  . Financial resource strain: Not on file  . Food insecurity:    Worry: Not on file    Inability: Not on file  . Transportation needs:    Medical: Not on file    Non-medical: Not on file  Tobacco Use  . Smoking status: Never Smoker  . Smokeless tobacco: Never Used  Substance and Sexual Activity  . Alcohol use: No    Alcohol/week: 0.0 standard drinks  . Drug use: No  . Sexual activity: Not on file  Lifestyle  . Physical activity:    Days per week: Not on file    Minutes  per session: Not on file  . Stress: Not on file  Relationships  . Social connections:    Talks on phone: Not on file    Gets together: Not on file    Attends religious service: Not on file    Active member of club or organization: Not on file    Attends meetings of clubs or organizations: Not on file    Relationship status: Not on file  Other Topics Concern  . Not on file  Social History Narrative  . Not on file    Outpatient Encounter Medications as of 03/31/2019  Medication Sig  . guaiFENesin-codeine (CHERATUSSIN AC) 100-10 MG/5ML syrup Take 5 mLs by mouth 2 (two) times daily as needed for cough.  . VENTOLIN HFA 108 (90 Base) MCG/ACT inhaler TAKE 2 PUFFS BY MOUTH EVERY 6 HOURS AS NEEDED FOR WHEEZE OR SHORTNESS OF BREATH   No facility-administered encounter medications on file as of 03/31/2019.     Review of Systems  Constitutional: Negative for appetite change and unexpected weight change.  HENT: Negative for congestion and sinus pressure.   Eyes: Negative for pain and visual disturbance.  Respiratory: Negative for chest tightness and shortness of breath.        No significant cough.   Cardiovascular: Negative for chest pain, palpitations and leg swelling.  Gastrointestinal: Negative for abdominal pain, diarrhea, nausea and vomiting.  Genitourinary: Negative for difficulty urinating and dysuria.  Musculoskeletal: Negative for joint swelling and myalgias.  Skin: Negative for color change and rash.  Neurological: Negative for dizziness, light-headedness and headaches.  Hematological: Negative for adenopathy. Does not bruise/bleed easily.  Psychiatric/Behavioral: Negative for agitation and dysphoric mood.       Objective:    Physical Exam Constitutional:      General: She is not in acute distress.    Appearance: Normal appearance. She is well-developed.  HENT:     Right Ear: External ear normal. There is no impacted cerumen.     Left Ear: External ear normal. There is no  impacted cerumen.  Eyes:     General: No scleral icterus.       Right eye: No discharge.        Left eye: No discharge.     Conjunctiva/sclera: Conjunctivae normal.  Neck:     Musculoskeletal: Neck supple. No muscular tenderness.     Thyroid: No thyromegaly.  Cardiovascular:     Rate and Rhythm: Normal rate and regular rhythm.  Pulmonary:     Effort: No tachypnea, accessory muscle usage or respiratory distress.     Breath sounds: Normal breath sounds. No decreased breath sounds or wheezing.  Chest:     Breasts:        Right: No inverted nipple, mass, nipple discharge or tenderness (no axillary adenopathy).        Left: No inverted nipple, mass, nipple discharge or tenderness (no axilarry adenopathy).  Abdominal:     General: Bowel sounds are normal.     Palpations: Abdomen is soft.     Tenderness: There is no abdominal tenderness.  Genitourinary:    Comments: Normal external genitalia.  Vaginal vault without lesions.  Cervix identified.  Pap smear performed.  Could not appreciate any adnexal masses or tenderness.   Musculoskeletal:        General: No swelling or tenderness.  Lymphadenopathy:     Cervical: No cervical adenopathy.  Skin:    Findings: No erythema or rash.  Neurological:     Mental Status: She is alert and oriented to person, place, and time.  Psychiatric:        Mood and Affect: Mood normal.        Behavior: Behavior normal.     BP 136/80 (BP Location: Right Arm, Patient Position: Sitting, Cuff Size: Large)   Pulse 86   Temp 98 F (36.7 C) (Oral)   Ht 5\' 8"  (1.727 m)   Wt (!) 353 lb 9.6 oz (160.4 kg)   SpO2 99%   BMI 53.76 kg/m  Wt Readings from Last 3 Encounters:  03/31/19 (!) 353 lb 9.6 oz (160.4 kg)  01/20/19 (!) 349 lb 3.2 oz (158.4 kg)  01/07/19 (!) 350 lb (158.8 kg)     Lab Results  Component Value Date   WBC 9.6 10/08/2015   HGB 13.0 10/08/2015   HCT 40.6 10/08/2015   PLT 279.0 10/08/2015   GLUCOSE 71 10/08/2015   CHOL 155 03/20/2014    TRIG 56.0 03/20/2014   HDL 49.70 03/20/2014   LDLCALC 94 03/20/2014   ALT 28 10/08/2015   AST 24 10/08/2015   NA 139 10/08/2015   K 4.1 10/08/2015  CL 105 10/08/2015   CREATININE 0.81 10/08/2015   BUN 9 10/08/2015   CO2 25 10/08/2015   TSH 1.97 10/08/2015       Assessment & Plan:   Problem List Items Addressed This Visit    Health care maintenance    Physical today 03/31/19.   PAP 03/31/19.  Schedule mammogram.       Hypertension, essential    Blood pressure as outlined.  Have her spot check her pressure.        Obesity, morbid, BMI 50 or higher (HCC)    Discussed diet and exercise.  Has f/u planned with bariatric surgeon.  W/up in progress.  Obtain records.       Stress    Increased stress.  Overall she feels she is doing well.  Does not feel needs anything more at this time.  Follow.         Other Visit Diagnoses    Breast cancer screening    -  Primary   Relevant Orders   MM 3D SCREEN BREAST BILATERAL   Screening for cervical cancer       Relevant Orders   Cytology - PAP( [place])       Dale Durhamharlene Laithan Conchas, MD

## 2019-03-31 NOTE — Assessment & Plan Note (Addendum)
Physical today 03/31/19.   PAP 03/31/19.  Schedule mammogram.

## 2019-04-03 ENCOUNTER — Encounter: Payer: Self-pay | Admitting: Internal Medicine

## 2019-04-03 NOTE — Assessment & Plan Note (Signed)
Discussed diet and exercise.  Has f/u planned with bariatric surgeon.  W/up in progress.  Obtain records.

## 2019-04-03 NOTE — Assessment & Plan Note (Signed)
Blood pressure as outlined.  Have her spot check her pressure.   

## 2019-04-03 NOTE — Assessment & Plan Note (Signed)
Increased stress.  Overall she feels she is doing well.  Does not feel needs anything more at this time.  Follow.

## 2019-04-06 LAB — CYTOLOGY - PAP: HPV: NOT DETECTED

## 2019-04-08 DIAGNOSIS — Z01818 Encounter for other preprocedural examination: Secondary | ICD-10-CM | POA: Diagnosis not present

## 2019-04-09 ENCOUNTER — Encounter: Payer: Self-pay | Admitting: Internal Medicine

## 2019-04-11 DIAGNOSIS — K2970 Gastritis, unspecified, without bleeding: Secondary | ICD-10-CM | POA: Diagnosis not present

## 2019-04-11 DIAGNOSIS — Z6841 Body Mass Index (BMI) 40.0 and over, adult: Secondary | ICD-10-CM | POA: Diagnosis not present

## 2019-04-11 DIAGNOSIS — K2960 Other gastritis without bleeding: Secondary | ICD-10-CM | POA: Diagnosis not present

## 2019-04-11 DIAGNOSIS — I159 Secondary hypertension, unspecified: Secondary | ICD-10-CM | POA: Diagnosis not present

## 2019-04-11 DIAGNOSIS — Z79899 Other long term (current) drug therapy: Secondary | ICD-10-CM | POA: Diagnosis not present

## 2019-04-11 DIAGNOSIS — I509 Heart failure, unspecified: Secondary | ICD-10-CM | POA: Diagnosis not present

## 2019-04-11 DIAGNOSIS — I110 Hypertensive heart disease with heart failure: Secondary | ICD-10-CM | POA: Diagnosis not present

## 2019-04-11 NOTE — Telephone Encounter (Signed)
Spoke with pt on the phone. Updated numbers in chart

## 2019-04-13 DIAGNOSIS — Z713 Dietary counseling and surveillance: Secondary | ICD-10-CM | POA: Diagnosis not present

## 2019-04-17 DIAGNOSIS — G4733 Obstructive sleep apnea (adult) (pediatric): Secondary | ICD-10-CM | POA: Diagnosis not present

## 2019-04-21 ENCOUNTER — Encounter: Payer: Self-pay | Admitting: Internal Medicine

## 2019-04-26 DIAGNOSIS — G4733 Obstructive sleep apnea (adult) (pediatric): Secondary | ICD-10-CM | POA: Diagnosis not present

## 2019-04-26 DIAGNOSIS — I159 Secondary hypertension, unspecified: Secondary | ICD-10-CM | POA: Diagnosis not present

## 2019-05-10 ENCOUNTER — Ambulatory Visit: Payer: BLUE CROSS/BLUE SHIELD

## 2019-05-11 ENCOUNTER — Other Ambulatory Visit: Payer: Self-pay

## 2019-05-11 ENCOUNTER — Ambulatory Visit
Admission: RE | Admit: 2019-05-11 | Discharge: 2019-05-11 | Disposition: A | Discharge status: Home / Self Care | Payer: BC Managed Care – PPO | Source: Ambulatory Visit | Attending: Internal Medicine | Admitting: Internal Medicine

## 2019-05-11 DIAGNOSIS — Z1231 Encounter for screening mammogram for malignant neoplasm of breast: Secondary | ICD-10-CM | POA: Insufficient documentation

## 2019-05-11 DIAGNOSIS — Z1239 Encounter for other screening for malignant neoplasm of breast: Secondary | ICD-10-CM | POA: Diagnosis not present

## 2019-05-12 ENCOUNTER — Other Ambulatory Visit: Payer: Self-pay | Admitting: Internal Medicine

## 2019-05-12 DIAGNOSIS — N6489 Other specified disorders of breast: Secondary | ICD-10-CM

## 2019-05-12 DIAGNOSIS — R928 Other abnormal and inconclusive findings on diagnostic imaging of breast: Secondary | ICD-10-CM

## 2019-05-16 DIAGNOSIS — Z713 Dietary counseling and surveillance: Secondary | ICD-10-CM | POA: Diagnosis not present

## 2019-05-20 ENCOUNTER — Encounter: Payer: Self-pay | Admitting: Internal Medicine

## 2019-05-23 ENCOUNTER — Other Ambulatory Visit: Payer: Self-pay

## 2019-05-23 ENCOUNTER — Ambulatory Visit
Admission: RE | Admit: 2019-05-23 | Discharge: 2019-05-23 | Disposition: A | Discharge status: Home / Self Care | Payer: BC Managed Care – PPO | Source: Ambulatory Visit | Attending: Internal Medicine | Admitting: Internal Medicine

## 2019-05-23 DIAGNOSIS — R928 Other abnormal and inconclusive findings on diagnostic imaging of breast: Secondary | ICD-10-CM

## 2019-05-23 DIAGNOSIS — N6489 Other specified disorders of breast: Secondary | ICD-10-CM | POA: Diagnosis not present

## 2019-05-24 ENCOUNTER — Telehealth: Payer: Self-pay

## 2019-05-24 DIAGNOSIS — G4733 Obstructive sleep apnea (adult) (pediatric): Secondary | ICD-10-CM | POA: Diagnosis not present

## 2019-05-24 DIAGNOSIS — Z6841 Body Mass Index (BMI) 40.0 and over, adult: Secondary | ICD-10-CM | POA: Diagnosis not present

## 2019-05-24 DIAGNOSIS — I159 Secondary hypertension, unspecified: Secondary | ICD-10-CM | POA: Diagnosis not present

## 2019-05-24 NOTE — Telephone Encounter (Signed)
Called patient to let her know that I do not have this form. It was faxed after her appointment with last office note and sent to scan the end of June. No forms scanned in chart. Advised patient that I will look into this. Dr Derrel Nip has agreed to sign new form if Dr Nicki Reaper is ok with her having the surgery. Per note on 5/21 there were no concerns.

## 2019-05-24 NOTE — Telephone Encounter (Signed)
See phone note

## 2019-05-24 NOTE — Telephone Encounter (Signed)
Copied from [place] [id number]. Topic: General - Other >> May 24, 2019 11:35 AM Rainey Pines A wrote: Patient would like to get the bariatric surgical evaluation form completed by Dr. Nicki Reaper and faxed back to Dr. Darnell Level at [phone] and patient would like a callback at [phone]

## 2019-05-25 ENCOUNTER — Encounter: Payer: Self-pay | Admitting: Internal Medicine

## 2019-05-25 NOTE — Telephone Encounter (Signed)
Patient is going to fax me another copy to fill out. No form scanned in chart

## 2019-05-26 ENCOUNTER — Encounter: Payer: Self-pay | Admitting: Internal Medicine

## 2019-05-26 NOTE — Telephone Encounter (Signed)
Spoke with patient requested records from Dr Darnell Level.

## 2019-05-26 NOTE — Telephone Encounter (Signed)
Addendum: Form was not sent to [facility] in June. Being held for further work up.

## 2019-05-27 ENCOUNTER — Encounter: Payer: Self-pay | Admitting: Internal Medicine

## 2019-05-29 DIAGNOSIS — G4733 Obstructive sleep apnea (adult) (pediatric): Secondary | ICD-10-CM | POA: Diagnosis not present

## 2019-05-31 ENCOUNTER — Telehealth: Payer: Self-pay | Admitting: Internal Medicine

## 2019-05-31 NOTE — Telephone Encounter (Signed)
Faxed. Patient is aware. Copy placed in mail for patient

## 2019-05-31 NOTE — Telephone Encounter (Signed)
For completed, signed and placed in box.

## 2019-05-31 NOTE — Telephone Encounter (Signed)
-----   Message from Lars Masson, LPN sent at 07/11/5175  8:14 AM EDT ----- Dr bruce's office sent over a packet of records yesterday. I have placed them in your folder for review when you return ----- Message ----- From: Einar Pheasant, MD Sent: 05/26/2019   9:34 PM EDT To: Lars Masson, LPN  Yes, please request records and get them to fax asap. Specifically need the cardiac w/up.  Thanks.    Dr Nicki Reaper ----- Message ----- From: Lars Masson, LPN Sent: 1/60/7371   9:02 AM EDT To: Einar Pheasant, MD  I have spoke with patient and Dr Synthia Innocent office. Surgery has not been scheduled yet- goal is to have surgery the beginning of Sept. Patient initially stated that she needed form completed this week because that is all they are waiting on. Advised patient that we have not received any records showing that she has had further w/up that was discussed. She has had the EGD and sleep study. She had her cardiac workup- stated does not have results. She is picking up her sleep machine Sunday night. Advised that I would call Dr Synthia Innocent office and have records and all results sent to our office. Patient stated that she could wait until next week but would like to have completed by the beginning of the week. I have already requested records from Dr Darnell Level ----- Message ----- From: Einar Pheasant, MD Sent: 05/26/2019   5:53 AM EDT To: Lars Masson, LPN  I received this message from Dr Derrel Nip.  When I saw her in May, she was still undergoing w/up for the surgery.  In reviewing, it appears she had EGD and sleep study.  She had also mentioned the possibility of cardiac testing, etc.  Did she have all of these tests?  When is this form needed?  When is surgery planned?  Is this something I can follow up on when I return at the beginning of week or does she need this week?    Dr Nicki Reaper ----- Message ----- From: Crecencio Mc, MD Sent: 05/25/2019   4:19 PM EDT To: Einar Pheasant, MD  Your patient  camellia popescu is wanting a clearance letter for bariatric surgery .  Your note from May 21 is not clear on whether she needs  further workup.  Do you wnat me to sign clearance letter?  Helene Kelp

## 2019-06-08 DIAGNOSIS — G4733 Obstructive sleep apnea (adult) (pediatric): Secondary | ICD-10-CM | POA: Diagnosis not present

## 2019-06-27 ENCOUNTER — Encounter: Payer: Self-pay | Admitting: Internal Medicine

## 2019-06-27 DIAGNOSIS — Z6841 Body Mass Index (BMI) 40.0 and over, adult: Secondary | ICD-10-CM | POA: Diagnosis not present

## 2019-06-27 DIAGNOSIS — I159 Secondary hypertension, unspecified: Secondary | ICD-10-CM | POA: Diagnosis not present

## 2019-06-27 DIAGNOSIS — G4733 Obstructive sleep apnea (adult) (pediatric): Secondary | ICD-10-CM | POA: Diagnosis not present

## 2019-07-06 DIAGNOSIS — Z01818 Encounter for other preprocedural examination: Secondary | ICD-10-CM | POA: Diagnosis not present

## 2019-07-14 ENCOUNTER — Ambulatory Visit: Payer: BLUE CROSS/BLUE SHIELD | Admitting: Internal Medicine

## 2019-07-14 DIAGNOSIS — I10 Essential (primary) hypertension: Secondary | ICD-10-CM | POA: Diagnosis not present

## 2019-07-14 DIAGNOSIS — Z6841 Body Mass Index (BMI) 40.0 and over, adult: Secondary | ICD-10-CM | POA: Diagnosis not present

## 2019-07-14 DIAGNOSIS — G4733 Obstructive sleep apnea (adult) (pediatric): Secondary | ICD-10-CM | POA: Diagnosis not present

## 2019-07-14 DIAGNOSIS — Z79899 Other long term (current) drug therapy: Secondary | ICD-10-CM | POA: Diagnosis not present

## 2019-07-28 DIAGNOSIS — Z9884 Bariatric surgery status: Secondary | ICD-10-CM | POA: Diagnosis not present

## 2019-07-28 DIAGNOSIS — Z713 Dietary counseling and surveillance: Secondary | ICD-10-CM | POA: Diagnosis not present

## 2019-08-09 DIAGNOSIS — G4733 Obstructive sleep apnea (adult) (pediatric): Secondary | ICD-10-CM | POA: Diagnosis not present

## 2019-08-15 ENCOUNTER — Ambulatory Visit: Payer: BC Managed Care – PPO | Admitting: Internal Medicine

## 2019-08-25 ENCOUNTER — Other Ambulatory Visit: Payer: Self-pay

## 2019-08-29 ENCOUNTER — Other Ambulatory Visit (HOSPITAL_COMMUNITY)
Admission: RE | Admit: 2019-08-29 | Discharge: 2019-08-29 | Disposition: A | Discharge status: Home / Self Care | Payer: BC Managed Care – PPO | Source: Ambulatory Visit | Attending: Internal Medicine | Admitting: Internal Medicine

## 2019-08-29 ENCOUNTER — Other Ambulatory Visit: Payer: Self-pay

## 2019-08-29 ENCOUNTER — Ambulatory Visit (INDEPENDENT_AMBULATORY_CARE_PROVIDER_SITE_OTHER): Payer: BC Managed Care – PPO | Admitting: Internal Medicine

## 2019-08-29 VITALS — BP 132/80 | HR 80 | Temp 97.8°F | Resp 16 | Wt 294.0 lb

## 2019-08-29 DIAGNOSIS — Z309 Encounter for contraceptive management, unspecified: Secondary | ICD-10-CM

## 2019-08-29 DIAGNOSIS — Z0000 Encounter for general adult medical examination without abnormal findings: Secondary | ICD-10-CM | POA: Diagnosis not present

## 2019-08-29 DIAGNOSIS — Z1151 Encounter for screening for human papillomavirus (HPV): Secondary | ICD-10-CM | POA: Diagnosis not present

## 2019-08-29 DIAGNOSIS — Z23 Encounter for immunization: Secondary | ICD-10-CM

## 2019-08-29 DIAGNOSIS — Z124 Encounter for screening for malignant neoplasm of cervix: Secondary | ICD-10-CM | POA: Diagnosis not present

## 2019-08-29 DIAGNOSIS — Z9884 Bariatric surgery status: Secondary | ICD-10-CM | POA: Insufficient documentation

## 2019-08-29 DIAGNOSIS — N760 Acute vaginitis: Secondary | ICD-10-CM | POA: Diagnosis not present

## 2019-08-29 DIAGNOSIS — I10 Essential (primary) hypertension: Secondary | ICD-10-CM

## 2019-08-29 DIAGNOSIS — F439 Reaction to severe stress, unspecified: Secondary | ICD-10-CM

## 2019-08-29 DIAGNOSIS — D509 Iron deficiency anemia, unspecified: Secondary | ICD-10-CM

## 2019-08-29 LAB — CBC WITH DIFFERENTIAL/PLATELET
Basophils Absolute: 0 10*3/uL (ref 0.0–0.1)
Basophils Relative: 0.5 % (ref 0.0–3.0)
Eosinophils Absolute: 0.2 10*3/uL (ref 0.0–0.7)
Eosinophils Relative: 2.2 % (ref 0.0–5.0)
HCT: 32.9 % — ABNORMAL LOW (ref 36.0–46.0)
Hemoglobin: 10.4 g/dL — ABNORMAL LOW (ref 12.0–15.0)
Lymphocytes Relative: 28.2 % (ref 12.0–46.0)
Lymphs Abs: 2.3 10*3/uL (ref 0.7–4.0)
MCHC: 31.7 g/dL (ref 30.0–36.0)
MCV: 76.7 fl — ABNORMAL LOW (ref 78.0–100.0)
Monocytes Absolute: 0.5 10*3/uL (ref 0.1–1.0)
Monocytes Relative: 6.4 % (ref 3.0–12.0)
Neutro Abs: 5.1 10*3/uL (ref 1.4–7.7)
Neutrophils Relative %: 62.7 % (ref 43.0–77.0)
Platelets: 227 10*3/uL (ref 150.0–400.0)
RBC: 4.29 Mil/uL (ref 3.87–5.11)
RDW: 17.8 % — ABNORMAL HIGH (ref 11.5–15.5)
WBC: 8.1 10*3/uL (ref 4.0–10.5)

## 2019-08-29 LAB — BASIC METABOLIC PANEL
BUN: 11 mg/dL (ref 6–23)
CO2: 28 mEq/L (ref 19–32)
Calcium: 8.8 mg/dL (ref 8.4–10.5)
Chloride: 103 mEq/L (ref 96–112)
Creatinine, Ser: 0.63 mg/dL (ref 0.40–1.20)
GFR: 103.96 mL/min (ref >60.00)
Glucose, Bld: 85 mg/dL (ref 70–99)
Potassium: 3.4 mEq/L — ABNORMAL LOW (ref 3.5–5.1)
Sodium: 140 mEq/L (ref 135–145)

## 2019-08-29 LAB — VITAMIN B12: Vitamin B-12: >1500 pg/mL — ABNORMAL HIGH (ref 211–911)

## 2019-08-29 LAB — FERRITIN: Ferritin: 9.8 ng/mL — ABNORMAL LOW (ref 10.0–291.0)

## 2019-08-29 MED ORDER — NORETHINDRONE 0.35 MG PO TABS: 1.0000 | ORAL_TABLET | Freq: Every day | ORAL | 11 refills | Status: DC

## 2019-08-29 NOTE — Progress Notes (Signed)
Patient ID: Erica Hernandez, female   DOB: Apr 29, 1976, 43 y.o.   MRN: [id number]   Subjective:    Patient ID: Erica Hernandez, female    DOB: 06/10/1976, 43 y.o.   MRN: [id number]  HPI  Patient here for a scheduled follow up and for a f/u pap smear.  She reports she is doing relatively well.  Is s/p bariatric surgery.  Has lost weight.  Has had some issues adjusting and some discomfort, but doing better now.  Can only eat small amounts at a time.  Has to watch this closely.  No chest pain.  No sob.  no abdominal pain.  Bowels moving.  Handling stress.  Discussed birth control.  Previously on depo.  Discussed progesterone only pill.      Past Medical History:  Diagnosis Date   Hypertension    during pregnancy   Past Surgical History:  Procedure Laterality Date   SHOULDER SURGERY  03/30/11   TONSILLECTOMY  1984   Family History  Problem Relation Age of Onset   Hypertension Father    Diabetes Other        parent   Breast cancer Maternal Grandmother    Breast cancer Paternal Grandmother    Colon cancer Neg Hx    Social History   Socioeconomic History   Marital status: Married    Spouse name: Not on file   Number of children: 1   Years of education: Not on file   Highest education level: Not on file  Occupational History    Employer: [employer]  Ecologist strain: Not on file   Food insecurity    Worry: Not on file    Inability: Not on file   Transportation needs    Medical: Not on file    Non-medical: Not on file  Tobacco Use   Smoking status: Never Smoker   Smokeless tobacco: Never Used  Substance and Sexual Activity   Alcohol use: No    Alcohol/week: 0.0 standard drinks   Drug use: No   Sexual activity: Not on file  Lifestyle   Physical activity    Days per week: Not on file    Minutes per session: Not on file   Stress: Not on file  Relationships   Social connections    Talks on phone: Not on file    Gets  together: Not on file    Attends religious service: Not on file    Active member of club or organization: Not on file    Attends meetings of clubs or organizations: Not on file    Relationship status: Not on file  Other Topics Concern   Not on file  Social History Narrative   Not on file    Outpatient Encounter Medications as of 08/29/2019  Medication Sig   norethindrone (MICRONOR) 0.35 MG tablet Take 1 tablet (0.35 mg total) by mouth daily.   ondansetron (ZOFRAN-ODT) 4 MG disintegrating tablet DISSOLVE 1 TABLET ON THE TONGUE EVERY 8 HOURS AS NEEDED FOR NAUSEA FOR UP TO 7 DAYS.   pantoprazole (PROTONIX) 40 MG tablet Take 40 mg by mouth daily.   [DISCONTINUED] guaiFENesin-codeine (CHERATUSSIN AC) 100-10 MG/5ML syrup Take 5 mLs by mouth 2 (two) times daily as needed for cough.   [DISCONTINUED] VENTOLIN HFA 108 (90 Base) MCG/ACT inhaler TAKE 2 PUFFS BY MOUTH EVERY 6 HOURS AS NEEDED FOR WHEEZE OR SHORTNESS OF BREATH   No facility-administered encounter medications on file as of 08/29/2019.  Review of Systems  Constitutional: Negative for appetite change and unexpected weight change.  HENT: Negative for congestion and sinus pressure.   Respiratory: Negative for cough, chest tightness and shortness of breath.   Cardiovascular: Negative for chest pain, palpitations and leg swelling.  Gastrointestinal: Negative for abdominal pain, diarrhea, nausea and vomiting.  Genitourinary: Negative for difficulty urinating and dysuria.  Musculoskeletal: Negative for joint swelling and myalgias.  Skin: Negative for color change and rash.  Neurological: Negative for dizziness, light-headedness and headaches.  Psychiatric/Behavioral: Negative for agitation and dysphoric mood.       Objective:    Physical Exam Constitutional:      General: She is not in acute distress.    Appearance: Normal appearance.  HENT:     Head: Normocephalic and atraumatic.     Right Ear: External ear normal.      Left Ear: External ear normal.  Eyes:     General: No scleral icterus.       Right eye: No discharge.        Left eye: No discharge.     Conjunctiva/sclera: Conjunctivae normal.  Neck:     Musculoskeletal: Neck supple. No muscular tenderness.     Thyroid: No thyromegaly.  Cardiovascular:     Rate and Rhythm: Normal rate and regular rhythm.  Pulmonary:     Effort: No respiratory distress.     Breath sounds: Normal breath sounds. No wheezing.  Abdominal:     General: Bowel sounds are normal.     Palpations: Abdomen is soft.     Tenderness: There is no abdominal tenderness.  Genitourinary:    Comments: Normal external genitalia.  Vaginal vault without lesions.  Cervix identified.  Pap smear performed.  Could not appreciate any adnexal masses or tenderness.  Vaginal swab obtained.  Musculoskeletal:        General: No swelling or tenderness.  Lymphadenopathy:     Cervical: No cervical adenopathy.  Skin:    Findings: No erythema or rash.  Neurological:     Mental Status: She is alert.  Psychiatric:        Mood and Affect: Mood normal.        Behavior: Behavior normal.     BP 132/80    Pulse 80    Temp 97.8 F (36.6 C)    Resp 16    Wt 294 lb (133.4 kg)    SpO2 98%    BMI 44.70 kg/m  Wt Readings from Last 3 Encounters:  08/29/19 294 lb (133.4 kg)  03/31/19 (!) 353 lb 9.6 oz (160.4 kg)  01/20/19 (!) 349 lb 3.2 oz (158.4 kg)     Lab Results  Component Value Date   WBC 8.1 08/29/2019   HGB 10.4 (L) 08/29/2019   HCT 32.9 (L) 08/29/2019   PLT 227.0 08/29/2019   GLUCOSE 85 08/29/2019   CHOL 155 03/20/2014   TRIG 56.0 03/20/2014   HDL 49.70 03/20/2014   LDLCALC 94 03/20/2014   ALT 28 10/08/2015   AST 24 10/08/2015   NA 140 08/29/2019   K 3.4 (L) 08/29/2019   CL 103 08/29/2019   CREATININE 0.63 08/29/2019   BUN 11 08/29/2019   CO2 28 08/29/2019   TSH 1.97 10/08/2015    US Breast Ltd Uni Right Inc Axilla  Result Date: 05/23/2019 CLINICAL DATA:  The patient was  called back for a lateral right breast asymmetry. EXAM: DIGITAL DIAGNOSTIC RIGHT MAMMOGRAM WITH TOMO ULTRASOUND RIGHT BREAST COMPARISON:  Previous exam(s). ACR Breast  Density Category b: There are scattered areas of fibroglandular density. FINDINGS: The right breast asymmetry has the appearance of glandular tissue on today's study. It is seen on today's 90 degree lateral view and appears unchanged since 2015. On physical exam, no suspicious lumps are identified. Targeted ultrasound is performed, showing no sonographic abnormalities in the lateral right breast. IMPRESSION: The asymmetry in the right breast is an [place] of tissue which is unchanged since 2015. No evidence of malignancy. RECOMMENDATION: Annual screening mammography. I have discussed the findings and recommendations with the patient. Results were also provided in writing at the conclusion of the visit. If applicable, a reminder letter will be sent to the patient regarding the next appointment. BI-RADS CATEGORY  2: Benign. Electronically Signed   By: Dorise Bullion III M.D   On: 05/23/2019 14:36   Mm Diag Breast Tomo Uni Right  Result Date: 05/23/2019 CLINICAL DATA:  The patient was called back for a lateral right breast asymmetry. EXAM: DIGITAL DIAGNOSTIC RIGHT MAMMOGRAM WITH TOMO ULTRASOUND RIGHT BREAST COMPARISON:  Previous exam(s). ACR Breast Density Category b: There are scattered areas of fibroglandular density. FINDINGS: The right breast asymmetry has the appearance of glandular tissue on today's study. It is seen on today's 90 degree lateral view and appears unchanged since 2015. On physical exam, no suspicious lumps are identified. Targeted ultrasound is performed, showing no sonographic abnormalities in the lateral right breast. IMPRESSION: The asymmetry in the right breast is an [place] of tissue which is unchanged since 2015. No evidence of malignancy. RECOMMENDATION: Annual screening mammography. I have discussed the findings and  recommendations with the patient. Results were also provided in writing at the conclusion of the visit. If applicable, a reminder letter will be sent to the patient regarding the next appointment. BI-RADS CATEGORY  2: Benign. Electronically Signed   By: Dorise Bullion III M.D   On: 05/23/2019 14:36       Assessment & Plan:   Problem List Items Addressed This Visit    Anemia    S/p bariatric surgery.  Follow cbc.       Cervical cancer screening    PAP today.  Vaginal swab to confirm no infection.       Relevant Orders   Cytology - PAP( [place]) (Completed)   Encounter for birth control    Discussed birth control.  Previously received depo injections.  Given concerns regarding age and history of elevated blood pressure, I would like to hold on estrogen containing ocp's.  Start micronor.       History of bariatric surgery    S/p bariatric surgery. Has lost weight.  Continue f/u with surgery.  Has adjusted diet.  Discussed importance of exercise.  Check cbc and metabolic panel.        Relevant Orders   CBC with Differential/Platelet (Completed)   Ferritin (Completed)   Vitamin B12 (Completed)   Basic metabolic panel (Completed)   Hypertension, essential    Blood pressure under good control.  On no medication.   Follow pressures.  Follow metabolic panel.  Hold on estrogen containing oral contraceptives.        Stress    Discussed with her today.  Overall she feels she is handling things relatively well.  Follow.         Other Visit Diagnoses    Acute vaginitis    -  Primary   Relevant Orders   Cervicovaginal ancillary only( ) (Completed)   Need for immunization against influenza  Relevant Orders   Flu Vaccine QUAD 36+ mos IM (Completed)       Dale Durhamharlene Sohail Capraro, MD

## 2019-08-30 DIAGNOSIS — Z0000 Encounter for general adult medical examination without abnormal findings: Secondary | ICD-10-CM | POA: Diagnosis not present

## 2019-08-30 DIAGNOSIS — Z23 Encounter for immunization: Secondary | ICD-10-CM | POA: Diagnosis not present

## 2019-08-31 ENCOUNTER — Encounter: Payer: Self-pay | Admitting: Internal Medicine

## 2019-08-31 ENCOUNTER — Other Ambulatory Visit: Payer: Self-pay | Admitting: Internal Medicine

## 2019-08-31 DIAGNOSIS — D649 Anemia, unspecified: Secondary | ICD-10-CM | POA: Insufficient documentation

## 2019-08-31 DIAGNOSIS — D509 Iron deficiency anemia, unspecified: Secondary | ICD-10-CM

## 2019-08-31 DIAGNOSIS — E876 Hypokalemia: Secondary | ICD-10-CM

## 2019-08-31 LAB — CERVICOVAGINAL ANCILLARY ONLY
Bacterial Vaginitis (gardnerella): NEGATIVE
Candida Glabrata: NEGATIVE
Candida Vaginitis: NEGATIVE
Comment: NEGATIVE
Comment: NEGATIVE
Comment: NEGATIVE

## 2019-08-31 NOTE — Progress Notes (Signed)
Order placed for f/u potassium.  

## 2019-08-31 NOTE — Progress Notes (Signed)
Order placed for GI referral.   

## 2019-09-02 LAB — CYTOLOGY - PAP
Comment: NEGATIVE
Diagnosis: NEGATIVE
High risk HPV: NEGATIVE

## 2019-09-02 NOTE — Telephone Encounter (Signed)
Please send her list of potassium foods.  See her my chart message.  Also, just tell her to ask the pharmacist for the equivalent to 325mg  ferrous sulfate.  Thanks    Dr Nicki Reaper

## 2019-09-02 NOTE — Telephone Encounter (Signed)
I have already mailed food list to her.

## 2019-09-03 ENCOUNTER — Encounter: Payer: Self-pay | Admitting: Internal Medicine

## 2019-09-04 ENCOUNTER — Encounter: Payer: Self-pay | Admitting: Internal Medicine

## 2019-09-04 DIAGNOSIS — Z309 Encounter for contraceptive management, unspecified: Secondary | ICD-10-CM | POA: Insufficient documentation

## 2019-09-04 DIAGNOSIS — Z124 Encounter for screening for malignant neoplasm of cervix: Secondary | ICD-10-CM | POA: Insufficient documentation

## 2019-09-04 NOTE — Assessment & Plan Note (Signed)
S/p bariatric surgery.  Follow cbc.   

## 2019-09-04 NOTE — Assessment & Plan Note (Signed)
Blood pressure under good control.  On no medication.   Follow pressures.  Follow metabolic panel.  Hold on estrogen containing oral contraceptives.

## 2019-09-04 NOTE — Assessment & Plan Note (Signed)
Discussed birth control.  Previously received depo injections.  Given concerns regarding age and history of elevated blood pressure, I would like to hold on estrogen containing ocp's.  Start micronor.

## 2019-09-04 NOTE — Assessment & Plan Note (Signed)
Discussed with her today.  Overall she feels she is handling things relatively well.  Follow.   

## 2019-09-04 NOTE — Assessment & Plan Note (Signed)
PAP today.  Vaginal swab to confirm no infection.

## 2019-09-04 NOTE — Assessment & Plan Note (Signed)
S/p bariatric surgery. Has lost weight.  Continue f/u with surgery.  Has adjusted diet.  Discussed importance of exercise.  Check cbc and metabolic panel.

## 2019-09-06 DIAGNOSIS — G4733 Obstructive sleep apnea (adult) (pediatric): Secondary | ICD-10-CM | POA: Diagnosis not present

## 2019-09-07 ENCOUNTER — Other Ambulatory Visit: Payer: Self-pay

## 2019-09-08 ENCOUNTER — Other Ambulatory Visit: Payer: Self-pay

## 2019-09-08 ENCOUNTER — Other Ambulatory Visit (INDEPENDENT_AMBULATORY_CARE_PROVIDER_SITE_OTHER): Payer: BC Managed Care – PPO

## 2019-09-08 DIAGNOSIS — E876 Hypokalemia: Secondary | ICD-10-CM

## 2019-09-09 LAB — POTASSIUM: Potassium: 4.1 mEq/L (ref 3.5–5.1)

## 2019-09-10 ENCOUNTER — Encounter: Payer: Self-pay | Admitting: Internal Medicine

## 2019-09-27 ENCOUNTER — Telehealth: Payer: Self-pay | Admitting: *Deleted

## 2019-09-27 ENCOUNTER — Other Ambulatory Visit: Payer: Self-pay

## 2019-09-27 DIAGNOSIS — D649 Anemia, unspecified: Secondary | ICD-10-CM

## 2019-09-27 NOTE — Telephone Encounter (Signed)
Order placed for labs.

## 2019-09-27 NOTE — Telephone Encounter (Signed)
Please place future orders for lab appt.  

## 2019-09-29 ENCOUNTER — Other Ambulatory Visit: Payer: Self-pay

## 2019-09-29 ENCOUNTER — Other Ambulatory Visit (INDEPENDENT_AMBULATORY_CARE_PROVIDER_SITE_OTHER): Payer: BC Managed Care – PPO

## 2019-09-29 DIAGNOSIS — D509 Iron deficiency anemia, unspecified: Secondary | ICD-10-CM

## 2019-09-29 DIAGNOSIS — D649 Anemia, unspecified: Secondary | ICD-10-CM

## 2019-09-29 NOTE — Addendum Note (Signed)
Addended by: Leeanne Rio on: 09/29/2019 04:41 PM   Modules accepted: Orders

## 2019-09-30 LAB — CBC WITH DIFFERENTIAL/PLATELET
Basophils Absolute: 0 10*3/uL (ref 0.0–0.1)
Basophils Relative: 0.5 % (ref 0.0–3.0)
Eosinophils Absolute: 0.1 10*3/uL (ref 0.0–0.7)
Eosinophils Relative: 1.7 % (ref 0.0–5.0)
HCT: 34.3 % — ABNORMAL LOW (ref 36.0–46.0)
Hemoglobin: 10.7 g/dL — ABNORMAL LOW (ref 12.0–15.0)
Lymphocytes Relative: 26.9 % (ref 12.0–46.0)
Lymphs Abs: 2.3 10*3/uL (ref 0.7–4.0)
MCHC: 31.2 g/dL (ref 30.0–36.0)
MCV: 77.4 fl — ABNORMAL LOW (ref 78.0–100.0)
Monocytes Absolute: 0.5 10*3/uL (ref 0.1–1.0)
Monocytes Relative: 6 % (ref 3.0–12.0)
Neutro Abs: 5.6 10*3/uL (ref 1.4–7.7)
Neutrophils Relative %: 64.9 % (ref 43.0–77.0)
Platelets: 242 10*3/uL (ref 150.0–400.0)
RBC: 4.43 Mil/uL (ref 3.87–5.11)
RDW: 17 % — ABNORMAL HIGH (ref 11.5–15.5)
WBC: 8.6 10*3/uL (ref 4.0–10.5)

## 2019-09-30 LAB — FERRITIN: Ferritin: 7.3 ng/mL — ABNORMAL LOW (ref 10.0–291.0)

## 2019-10-01 ENCOUNTER — Other Ambulatory Visit: Payer: Self-pay | Admitting: Internal Medicine

## 2019-10-01 ENCOUNTER — Encounter: Payer: Self-pay | Admitting: Internal Medicine

## 2019-10-01 DIAGNOSIS — D509 Iron deficiency anemia, unspecified: Secondary | ICD-10-CM

## 2019-10-01 NOTE — Progress Notes (Signed)
Order placed for f/u lab appt.  

## 2019-10-03 ENCOUNTER — Other Ambulatory Visit: Payer: Self-pay

## 2019-10-03 MED ORDER — NORETHINDRONE 0.35 MG PO TABS: 1.0000 | ORAL_TABLET | Freq: Every day | ORAL | 3 refills | Status: DC

## 2019-10-03 NOTE — Telephone Encounter (Signed)
My chart message sent to pt.

## 2019-10-05 NOTE — Telephone Encounter (Signed)
In reviewing her medications, it appears the birth control has been sent in.  Will you please confirm with pharmacy.  She is saying - [facility] not received.

## 2019-10-14 DIAGNOSIS — Z713 Dietary counseling and surveillance: Secondary | ICD-10-CM | POA: Diagnosis not present

## 2019-10-14 DIAGNOSIS — Z9884 Bariatric surgery status: Secondary | ICD-10-CM | POA: Diagnosis not present

## 2019-11-28 ENCOUNTER — Other Ambulatory Visit: Payer: BC Managed Care – PPO

## 2019-12-15 DIAGNOSIS — E611 Iron deficiency: Secondary | ICD-10-CM | POA: Diagnosis not present

## 2019-12-15 DIAGNOSIS — Z9884 Bariatric surgery status: Secondary | ICD-10-CM | POA: Diagnosis not present

## 2020-01-10 DIAGNOSIS — D509 Iron deficiency anemia, unspecified: Secondary | ICD-10-CM | POA: Diagnosis not present

## 2020-01-10 DIAGNOSIS — Z6837 Body mass index (BMI) 37.0-37.9, adult: Secondary | ICD-10-CM | POA: Diagnosis not present

## 2020-01-10 DIAGNOSIS — Z9884 Bariatric surgery status: Secondary | ICD-10-CM | POA: Diagnosis not present

## 2020-01-19 DIAGNOSIS — K9049 Malabsorption due to intolerance, not elsewhere classified: Secondary | ICD-10-CM | POA: Diagnosis not present

## 2020-01-19 DIAGNOSIS — D649 Anemia, unspecified: Secondary | ICD-10-CM | POA: Diagnosis not present

## 2020-01-19 DIAGNOSIS — Z6837 Body mass index (BMI) 37.0-37.9, adult: Secondary | ICD-10-CM | POA: Diagnosis not present

## 2020-01-19 DIAGNOSIS — Z9884 Bariatric surgery status: Secondary | ICD-10-CM | POA: Diagnosis not present

## 2020-01-19 DIAGNOSIS — D539 Nutritional anemia, unspecified: Secondary | ICD-10-CM | POA: Diagnosis not present

## 2020-01-19 DIAGNOSIS — R5383 Other fatigue: Secondary | ICD-10-CM | POA: Diagnosis not present

## 2020-01-27 DIAGNOSIS — Z713 Dietary counseling and surveillance: Secondary | ICD-10-CM | POA: Diagnosis not present

## 2020-01-27 DIAGNOSIS — Z9884 Bariatric surgery status: Secondary | ICD-10-CM | POA: Diagnosis not present

## 2020-01-27 DIAGNOSIS — E669 Obesity, unspecified: Secondary | ICD-10-CM | POA: Diagnosis not present

## 2020-02-06 DIAGNOSIS — D649 Anemia, unspecified: Secondary | ICD-10-CM | POA: Diagnosis not present

## 2020-03-02 ENCOUNTER — Other Ambulatory Visit: Payer: Self-pay

## 2020-03-02 ENCOUNTER — Telehealth: Payer: Self-pay | Admitting: *Deleted

## 2020-03-02 ENCOUNTER — Ambulatory Visit: Payer: BC Managed Care – PPO | Admitting: Internal Medicine

## 2020-03-02 VITALS — BP 122/70 | HR 73 | Temp 97.9°F | Resp 16 | Ht 68.0 in | Wt 229.0 lb

## 2020-03-02 DIAGNOSIS — D509 Iron deficiency anemia, unspecified: Secondary | ICD-10-CM

## 2020-03-02 DIAGNOSIS — Z9884 Bariatric surgery status: Secondary | ICD-10-CM

## 2020-03-02 DIAGNOSIS — I10 Essential (primary) hypertension: Secondary | ICD-10-CM | POA: Diagnosis not present

## 2020-03-02 DIAGNOSIS — Z882 Allergy status to sulfonamides status: Secondary | ICD-10-CM | POA: Diagnosis not present

## 2020-03-02 DIAGNOSIS — E669 Obesity, unspecified: Secondary | ICD-10-CM | POA: Diagnosis not present

## 2020-03-02 DIAGNOSIS — Z903 Acquired absence of stomach [part of]: Secondary | ICD-10-CM | POA: Diagnosis not present

## 2020-03-02 DIAGNOSIS — N926 Irregular menstruation, unspecified: Secondary | ICD-10-CM

## 2020-03-02 DIAGNOSIS — N939 Abnormal uterine and vaginal bleeding, unspecified: Secondary | ICD-10-CM | POA: Diagnosis not present

## 2020-03-02 DIAGNOSIS — D649 Anemia, unspecified: Secondary | ICD-10-CM | POA: Diagnosis not present

## 2020-03-02 LAB — CBC WITH DIFFERENTIAL/PLATELET
Basophils Absolute: 0 10*3/uL (ref 0.0–0.1)
Basophils Relative: 0.8 % (ref 0.0–3.0)
Eosinophils Absolute: 0.1 10*3/uL (ref 0.0–0.7)
Eosinophils Relative: 1.5 % (ref 0.0–5.0)
HCT: 21.3 % — CL (ref 36.0–46.0)
Hemoglobin: 6.6 g/dL — CL (ref 12.0–15.0)
Lymphocytes Relative: 32.8 % (ref 12.0–46.0)
Lymphs Abs: 1.8 10*3/uL (ref 0.7–4.0)
MCHC: 31 g/dL (ref 30.0–36.0)
MCV: 76.7 fl — ABNORMAL LOW (ref 78.0–100.0)
Monocytes Absolute: 0.4 10*3/uL (ref 0.1–1.0)
Monocytes Relative: 7.5 % (ref 3.0–12.0)
Neutro Abs: 3.1 10*3/uL (ref 1.4–7.7)
Neutrophils Relative %: 57.4 % (ref 43.0–77.0)
Platelets: 231 10*3/uL (ref 150.0–400.0)
RBC: 2.78 Mil/uL — ABNORMAL LOW (ref 3.87–5.11)
RDW: 18 % — ABNORMAL HIGH (ref 11.5–15.5)
WBC: 5.4 10*3/uL (ref 4.0–10.5)

## 2020-03-02 LAB — IBC + FERRITIN
Ferritin: 4.7 ng/mL — ABNORMAL LOW (ref 10.0–291.0)
Iron: 10 ug/dL — ABNORMAL LOW (ref 42–145)
Saturation Ratios: 2.6 % — ABNORMAL LOW (ref 20.0–50.0)
Transferrin: 279 mg/dL (ref 212.0–360.0)

## 2020-03-02 NOTE — Telephone Encounter (Signed)
Patient's hemoglobin  was 10.4  Just 5 months ago and is now 6.6   Please tell her to go to the ER to get evaluated and treated

## 2020-03-02 NOTE — Telephone Encounter (Signed)
Patient stated hematology advised that she could wait for an iron infusion next week. Called hematology to confirm. Pt misunderstood. They recommended her go to ED today due to her hgb being critically low and then would be set up for infusions next week. They are were on the phone with patient advising ED again when I hung up. Will call patient to confirm.

## 2020-03-02 NOTE — Telephone Encounter (Signed)
Patient is aware. Stated she is going to call her hematologist and if not able to reach will go to ED. Advised patient that if she cannot get in touch with them she needs to go to ED either way since her values are critically low.

## 2020-03-02 NOTE — Telephone Encounter (Signed)
CRITICAL VALUE STICKER  CRITICAL VALUE: Hemoglobin: 6.6 & Hematocrit: 21.3  RECEIVER (on-site recipient of call): Silvestre Moment, CMA/XT  DATE & TIME NOTIFIED: 4/23 @ 3:10pm  MESSENGER (representative from lab): Lowella Bandy (Student @ [employer])  MD NOTIFIED: Dale Brainerd  TIME OF NOTIFICATION: 3:11pm  RESPONSE: see note/results

## 2020-03-02 NOTE — Telephone Encounter (Signed)
Confirmed with patient she is going to ED. Faxed labs to [facility] per request of their office.

## 2020-03-02 NOTE — Telephone Encounter (Signed)
Rerouted to Dr. Darrick Huntsman in Dr. Lorin Picket absence

## 2020-03-02 NOTE — Progress Notes (Signed)
Patient ID: Erica Hernandez, female   DOB: 1976/02/21, 44 y.o.   MRN: [id number]   Subjective:    Patient ID: Erica Hernandez, female    DOB: Apr 06, 1976, 44 y.o.   MRN: [id number]  HPI This visit occurred during the SARS-CoV-2 public health emergency.  Safety protocols were in place, including screening questions prior to the visit, additional usage of staff PPE, and extensive cleaning of exam room while observing appropriate contact time as indicated for disinfecting solutions.  Patient here for a scheduled follow up.  She is s/p bariatric surgery.  Has been having heavy vaginal bleeding.  Off depo.  On micronor.  She has kept a menstrual diary.  Bled from 10/4-10/17, 10/25-11/4, 11/21-11/24, 12/10-12/14 and has continued.  She is day 17 of heavy bleeding now.  On iron.  12/2019 - hgb 9.8.  Most recent check 9.4.  Due to f/u with hematology regarding her anemia.  [facility] in [place]).  Discussed the need for gyn evaluation given persistent bleeding.  She is eating.  No nausea or vomiting.  No abdominal pain.  Breathing stable.  No increased cough or congestion.    Past Medical History:  Diagnosis Date  . Hypertension    during pregnancy   Past Surgical History:  Procedure Laterality Date  . SHOULDER SURGERY  03/30/11  . TONSILLECTOMY  1984   Family History  Problem Relation Age of Onset  . Hypertension Father   . Diabetes Other        parent  . Breast cancer Maternal Grandmother   . Breast cancer Paternal Grandmother   . Colon cancer Neg Hx    Social History   Socioeconomic History  . Marital status: Married    Spouse name: Not on file  . Number of children: 1  . Years of education: Not on file  . Highest education level: Not on file  Occupational History    Employer: [employer]  Tobacco Use  . Smoking status: Never Smoker  . Smokeless tobacco: Never Used  Substance and Sexual Activity  . Alcohol use: No    Alcohol/week: 0.0 standard drinks  . Drug use: No   . Sexual activity: Not on file  Other Topics Concern  . Not on file  Social History Narrative  . Not on file   Social Determinants of Health   Financial Resource Strain:   . Difficulty of Paying Living Expenses:   Food Insecurity:   . Worried About Programme researcher, broadcasting/film/video in the Last Year:   . Barista in the Last Year:   Transportation Needs:   . Freight forwarder (Medical):   Marland Kitchen Lack of Transportation (Non-Medical):   Physical Activity:   . Days of Exercise per Week:   . Minutes of Exercise per Session:   Stress:   . Feeling of Stress :   Social Connections:   . Frequency of Communication with Friends and Family:   . Frequency of Social Gatherings with Friends and Family:   . Attends Religious Services:   . Active Member of Clubs or Organizations:   . Attends Banker Meetings:   Marland Kitchen Marital Status:     Outpatient Encounter Medications as of 03/02/2020  Medication Sig  . Calcium Carbonate (CALCIUM 500 PO) Take by mouth.  . Iron-FA-B Cmp-C-Biot-Probiotic (FUSION PLUS) CAPS Take 1 capsule by mouth daily.  . norethindrone (MICRONOR) 0.35 MG tablet Take 1 tablet (0.35 mg total) by mouth daily.  Marland Kitchen  ondansetron (ZOFRAN-ODT) 4 MG disintegrating tablet DISSOLVE 1 TABLET ON THE TONGUE EVERY 8 HOURS AS NEEDED FOR NAUSEA FOR UP TO 7 DAYS.  Marland Kitchen pantoprazole (PROTONIX) 40 MG tablet Take 40 mg by mouth daily.   No facility-administered encounter medications on file as of 03/02/2020.   Review of Systems  Constitutional: Positive for fatigue. Negative for appetite change and unexpected weight change.  HENT: Negative for congestion and sinus pressure.   Respiratory: Negative for cough, chest tightness and shortness of breath.   Cardiovascular: Negative for chest pain, palpitations and leg swelling.  Gastrointestinal: Negative for abdominal pain, diarrhea, nausea and vomiting.  Genitourinary: Negative for difficulty urinating and dysuria.  Musculoskeletal: Negative for  joint swelling and myalgias.  Skin: Negative for color change and rash.  Neurological: Negative for dizziness, light-headedness and headaches.  Psychiatric/Behavioral: Negative for agitation and dysphoric mood.       Objective:    Physical Exam Vitals reviewed.  Constitutional:      General: She is not in acute distress.    Appearance: Normal appearance.  HENT:     Head: Normocephalic and atraumatic.     Right Ear: External ear normal.     Left Ear: External ear normal.  Eyes:     General: No scleral icterus.       Right eye: No discharge.        Left eye: No discharge.     Conjunctiva/sclera: Conjunctivae normal.  Neck:     Thyroid: No thyromegaly.  Cardiovascular:     Rate and Rhythm: Normal rate and regular rhythm.  Pulmonary:     Effort: No respiratory distress.     Breath sounds: Normal breath sounds. No wheezing.  Abdominal:     General: Bowel sounds are normal.     Palpations: Abdomen is soft.     Tenderness: There is no abdominal tenderness.  Musculoskeletal:        General: No swelling or tenderness.     Cervical back: Neck supple. No tenderness.  Lymphadenopathy:     Cervical: No cervical adenopathy.  Skin:    Findings: No erythema or rash.  Neurological:     Mental Status: She is alert.  Psychiatric:        Mood and Affect: Mood normal.        Behavior: Behavior normal.     BP 122/70   Pulse 73   Temp 97.9 F (36.6 C)   Resp 16   Ht 5\' 8"  (1.727 m)   Wt 229 lb (103.9 kg)   SpO2 99%   BMI 34.82 kg/m  Wt Readings from Last 3 Encounters:  03/02/20 229 lb (103.9 kg)  08/29/19 294 lb (133.4 kg)  03/31/19 (!) 353 lb 9.6 oz (160.4 kg)     Lab Results  Component Value Date   WBC 5.4 03/02/2020   HGB 6.6 Repeated and verified X2. (LL) 03/02/2020   HCT 21.3 Repeated and verified X2. (LL) 03/02/2020   PLT 231.0 03/02/2020   GLUCOSE 85 08/29/2019   CHOL 155 03/20/2014   TRIG 56.0 03/20/2014   HDL 49.70 03/20/2014   LDLCALC 94 03/20/2014    ALT 28 10/08/2015   AST 24 10/08/2015   NA 140 08/29/2019   K 4.1 09/08/2019   CL 103 08/29/2019   CREATININE 0.63 08/29/2019   BUN 11 08/29/2019   CO2 28 08/29/2019   TSH 1.97 10/08/2015       Assessment & Plan:   Problem List Items Addressed This Visit  Anemia - Primary    S/p bariatric surgery.  Now with heavy menstrual bleeding.  Last hgb 9.4.  Recheck cbc today.  On iron supplements.        Relevant Medications   Iron-FA-B Cmp-C-Biot-Probiotic (FUSION PLUS) CAPS   Other Relevant Orders   CBC with Differential/Platelet (Completed)   IBC + Ferritin (Completed)   History of bariatric surgery    S/p bariatric surgery.  Has lost weight.  Anemia.  Now with heavy menstrual bleeding.  Due to f/u with hematology.  Recheck cbc.  Eating.  No upper symptoms reported.       Menstrual irregularity    Increased bleeding as outlined.  Heavy periods - day 17 with this last episode.  Discussed the need for gyn evaluation given persistent bleeding.  Follow.        Relevant Orders   Ambulatory referral to Gynecology       Einar Pheasant, MD

## 2020-03-05 ENCOUNTER — Encounter: Payer: Self-pay | Admitting: Internal Medicine

## 2020-03-06 DIAGNOSIS — D509 Iron deficiency anemia, unspecified: Secondary | ICD-10-CM | POA: Diagnosis not present

## 2020-03-10 ENCOUNTER — Encounter: Payer: Self-pay | Admitting: Internal Medicine

## 2020-03-10 DIAGNOSIS — N926 Irregular menstruation, unspecified: Secondary | ICD-10-CM | POA: Insufficient documentation

## 2020-03-10 NOTE — Assessment & Plan Note (Signed)
Increased bleeding as outlined.  Heavy periods - day 17 with this last episode.  Discussed the need for gyn evaluation given persistent bleeding.  Follow.

## 2020-03-10 NOTE — Assessment & Plan Note (Signed)
S/p bariatric surgery.  Has lost weight.  Anemia.  Now with heavy menstrual bleeding.  Due to f/u with hematology.  Recheck cbc.  Eating.  No upper symptoms reported.

## 2020-03-10 NOTE — Assessment & Plan Note (Signed)
S/p bariatric surgery.  Now with heavy menstrual bleeding.  Last hgb 9.4.  Recheck cbc today.  On iron supplements.

## 2020-03-13 DIAGNOSIS — K9049 Malabsorption due to intolerance, not elsewhere classified: Secondary | ICD-10-CM | POA: Diagnosis not present

## 2020-03-13 DIAGNOSIS — D509 Iron deficiency anemia, unspecified: Secondary | ICD-10-CM | POA: Diagnosis not present

## 2020-03-19 DIAGNOSIS — K9049 Malabsorption due to intolerance, not elsewhere classified: Secondary | ICD-10-CM | POA: Diagnosis not present

## 2020-03-19 DIAGNOSIS — D509 Iron deficiency anemia, unspecified: Secondary | ICD-10-CM | POA: Diagnosis not present

## 2020-04-03 DIAGNOSIS — Z3202 Encounter for pregnancy test, result negative: Secondary | ICD-10-CM | POA: Diagnosis not present

## 2020-04-03 DIAGNOSIS — Z113 Encounter for screening for infections with a predominantly sexual mode of transmission: Secondary | ICD-10-CM | POA: Diagnosis not present

## 2020-04-03 DIAGNOSIS — N921 Excessive and frequent menstruation with irregular cycle: Secondary | ICD-10-CM | POA: Diagnosis not present

## 2020-04-10 DIAGNOSIS — D509 Iron deficiency anemia, unspecified: Secondary | ICD-10-CM | POA: Diagnosis not present

## 2020-04-16 DIAGNOSIS — N921 Excessive and frequent menstruation with irregular cycle: Secondary | ICD-10-CM | POA: Diagnosis not present

## 2020-04-18 DIAGNOSIS — D509 Iron deficiency anemia, unspecified: Secondary | ICD-10-CM | POA: Diagnosis not present

## 2020-04-25 DIAGNOSIS — D509 Iron deficiency anemia, unspecified: Secondary | ICD-10-CM | POA: Diagnosis not present

## 2020-04-27 DIAGNOSIS — E669 Obesity, unspecified: Secondary | ICD-10-CM | POA: Diagnosis not present

## 2020-04-27 DIAGNOSIS — Z9884 Bariatric surgery status: Secondary | ICD-10-CM | POA: Diagnosis not present

## 2020-04-27 DIAGNOSIS — Z713 Dietary counseling and surveillance: Secondary | ICD-10-CM | POA: Diagnosis not present

## 2020-05-23 DIAGNOSIS — D509 Iron deficiency anemia, unspecified: Secondary | ICD-10-CM | POA: Diagnosis not present

## 2020-06-15 ENCOUNTER — Telehealth: Payer: Self-pay | Admitting: Internal Medicine

## 2020-06-15 ENCOUNTER — Encounter: Payer: Self-pay | Admitting: Emergency Medicine

## 2020-06-15 ENCOUNTER — Other Ambulatory Visit: Payer: Self-pay

## 2020-06-15 ENCOUNTER — Ambulatory Visit
Admission: EM | Admit: 2020-06-15 | Discharge: 2020-06-15 | Disposition: A | Discharge status: Home / Self Care | Payer: BC Managed Care – PPO | Attending: Family Medicine | Admitting: Family Medicine

## 2020-06-15 DIAGNOSIS — R5383 Other fatigue: Secondary | ICD-10-CM

## 2020-06-15 LAB — CBC WITH DIFFERENTIAL/PLATELET
Abs Immature Granulocytes: 0.01 10*3/uL (ref 0.00–0.07)
Basophils Absolute: 0 10*3/uL (ref 0.0–0.1)
Basophils Relative: 0 %
Eosinophils Absolute: 0.1 10*3/uL (ref 0.0–0.5)
Eosinophils Relative: 2 %
HCT: 39.9 % (ref 36.0–46.0)
Hemoglobin: 13.1 g/dL (ref 12.0–15.0)
Immature Granulocytes: 0 %
Lymphocytes Relative: 32 %
Lymphs Abs: 1.9 10*3/uL (ref 0.7–4.0)
MCH: 29.3 pg (ref 26.0–34.0)
MCHC: 32.8 g/dL (ref 30.0–36.0)
MCV: 89.3 fL (ref 80.0–100.0)
Monocytes Absolute: 0.3 10*3/uL (ref 0.1–1.0)
Monocytes Relative: 5 %
Neutro Abs: 3.5 10*3/uL (ref 1.7–7.7)
Neutrophils Relative %: 61 %
Platelets: 193 10*3/uL (ref 150–400)
RBC: 4.47 MIL/uL (ref 3.87–5.11)
RDW: 15.8 % — ABNORMAL HIGH (ref 11.5–15.5)
WBC: 5.8 10*3/uL (ref 4.0–10.5)
nRBC: 0 % (ref 0.0–0.2)

## 2020-06-15 LAB — IRON AND TIBC
Iron: 40 ug/dL (ref 28–170)
Saturation Ratios: 14 % (ref 10.4–31.8)
TIBC: 280 ug/dL (ref 250–450)
UIBC: 240 ug/dL

## 2020-06-15 LAB — FERRITIN: Ferritin: 62 ng/mL (ref 11–307)

## 2020-06-15 NOTE — ED Provider Notes (Signed)
[facility]    CSN: [id number] Arrival date & time: 06/15/20  1100      History   Chief Complaint Chief Complaint  Patient presents with  . Fatigue   HPI   44 year old female presents with fatigue.  Patient reports that she has been feeling tired/fatigued since yesterday.  Patient denies respiratory symptoms.  She has no other complaints at this time.  She is concerned that this is related to iron deficiency and anemia.  She has a history of this and has required transfusion in the past.  She is currently followed by a hematologist in [place].  She receives regular iron infusions.  Most recent iron infusion was 6/16.  Patient has laboratory records with her today which show a normal hemoglobin of 12.4 on 7/14.  Iron was 56 at that time and ferritin was 148.  Patient requesting blood work today.  No other complaints.  Past Medical History:  Diagnosis Date  . Hypertension    during pregnancy    Patient Active Problem List   Diagnosis Date Noted  . Menstrual irregularity 03/10/2020  . Cervical cancer screening 09/04/2019  . Encounter for birth control 09/04/2019  . Anemia 08/31/2019  . 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/29/2019  . Skin lesion 01/22/2019  . Breast lesion 10/14/2015  . Health care maintenance 10/14/2015  . Abdominal cyst 04/23/2015  . Hypertension, essential 11/19/2014  . Ear pain 10/12/2014  . Stress 07/23/2014  . Fullness in head 07/23/2014  . Headache(784.0) 03/20/2014  . Rectal irritation 03/20/2014  . Sinusitis 01/15/2014    Past Surgical History:  Procedure Laterality Date  . SHOULDER SURGERY  03/30/11  . TONSILLECTOMY  1984    OB History   No obstetric history on file.      Home Medications    Prior to Admission medications   Medication Sig Start Date End Date Taking? Authorizing Provider  Calcium Carbonate (CALCIUM 500 PO) Take by mouth.   Yes [provider]  pantoprazole (PROTONIX) 40 MG tablet Take 40 mg by mouth  daily. 08/08/19  Yes [provider]  ondansetron (ZOFRAN-ODT) 4 MG disintegrating tablet DISSOLVE 1 TABLET ON THE TONGUE EVERY 8 HOURS AS NEEDED FOR NAUSEA FOR UP TO 7 DAYS. 06/27/19   [provider]  norethindrone (MICRONOR) 0.35 MG tablet Take 1 tablet (0.35 mg total) by mouth daily. 10/03/19 06/15/20  Dale Camuy, MD    Family History Family History  Problem Relation Age of Onset  . Hypertension Father   . Diabetes Other        parent  . Breast cancer Maternal Grandmother   . Breast cancer Paternal Grandmother   . Colon cancer Neg Hx     Social History Social History   Tobacco Use  . Smoking status: Never Smoker  . Smokeless tobacco: Never Used  Vaping Use  . Vaping Use: Never used  Substance Use Topics  . Alcohol use: No    Alcohol/week: 0.0 standard drinks  . Drug use: No     Allergies   Sulfa antibiotics   Review of Systems Review of Systems  Constitutional: Positive for fatigue.  HENT: Negative.   Respiratory: Negative.    Physical Exam Triage Vital Signs ED Triage Vitals  Enc Vitals Group     BP 06/15/20 1135 126/79     Pulse Rate 06/15/20 1135 73     Resp 06/15/20 1135 14     Temp 06/15/20 1135 98.4 F (36.9 C)     Temp  Source 06/15/20 1135 Oral     SpO2 06/15/20 1135 99 %     Weight 06/15/20 1132 211 lb (95.7 kg)     Height 06/15/20 1132 5\' 7"  (1.702 m)     Head Circumference --      Peak Flow --      Pain Score 06/15/20 1131 0     Pain Loc --      Pain Edu? --      Excl. in GC? --    Updated Vital Signs BP 126/79 (BP Location: Right Arm)   Pulse 73   Temp 98.4 F (36.9 C) (Oral)   Resp 14   Ht 5\' 7"  (1.702 m)   Wt 95.7 kg   LMP 06/08/2020 (Approximate)   SpO2 99%   BMI 33.05 kg/m   Visual Acuity Right Eye Distance:   Left Eye Distance:   Bilateral Distance:    Right Eye Near:   Left Eye Near:    Bilateral Near:     Physical Exam Vitals and nursing note reviewed.  Constitutional:      General: She is  not in acute distress.    Appearance: Normal appearance. She is not ill-appearing.  HENT:     Head: Normocephalic and atraumatic.  Eyes:     General:        Right eye: No discharge.        Left eye: No discharge.     Conjunctiva/sclera: Conjunctivae normal.  Cardiovascular:     Rate and Rhythm: Normal rate and regular rhythm.     Heart sounds: No murmur heard.   Pulmonary:     Effort: Pulmonary effort is normal.     Breath sounds: Normal breath sounds. No wheezing, rhonchi or rales.  Neurological:     Mental Status: She is alert.  Psychiatric:        Mood and Affect: Mood normal.        Behavior: Behavior normal.    UC Treatments / Results  Labs (all labs ordered are listed, but only abnormal results are displayed) Labs Reviewed  CBC WITH DIFFERENTIAL/PLATELET - Abnormal; Notable for the following components:      Result Value   RDW 15.8 (*)    All other components within normal limits  FERRITIN  IRON AND TIBC    EKG   Radiology No results found.  Procedures Procedures (including critical care time)  Medications Ordered in UC Medications - No data to display  Initial Impression / Assessment and Plan / UC Course  I have reviewed the triage vital signs and the nursing notes.  Pertinent labs & imaging results that were available during my care of the patient were reviewed by me and considered in my medical decision making (see chart for details).    44 year old female presents with acute fatigue.  She is well-appearing on exam.  She has no other complaints other than fatigue.  No respiratory symptoms.  Patient concerned that anemia is the culprit as she has a history of anemia requiring transfusion.  She is currently receiving iron infusions.  CBC with normal hemoglobin today of 13.1.  Advised close follow-up with hematology/oncology.  Supportive care.  Final Clinical Impressions(s) / UC Diagnoses   Final diagnoses:  Fatigue, unspecified type     Discharge  Instructions     Hemoglobin normal.  Rest.  Follow up with your PCP.  Take care  Dr. 06/10/2020    ED Prescriptions    None  PDMP not reviewed this encounter.   Tommie Sams, DO 06/15/20 1324

## 2020-06-15 NOTE — Telephone Encounter (Signed)
Patient say she has been dizzy and just feels nervous and her last iron infusion was about 6 weeks ago and this is what she feels like when her iron stores get low . Advised she need s to be seen today she is going to [facility]  And her husband is driving her.

## 2020-06-15 NOTE — Discharge Instructions (Addendum)
Hemoglobin normal.  Rest.  Follow up with your PCP.  Take care  Dr. Adriana Simas

## 2020-06-15 NOTE — Telephone Encounter (Signed)
Transferred to Access Nurse -Molly Maduro   Pt thinks that her Iron maybe low  She is dizzy,lightheaded and weak

## 2020-06-15 NOTE — ED Triage Notes (Signed)
Patient c/o fatigue and head congestion that started yesterday.  Patient denies cold symptoms.

## 2020-06-26 ENCOUNTER — Other Ambulatory Visit: Payer: Self-pay

## 2020-06-28 ENCOUNTER — Other Ambulatory Visit: Payer: Self-pay

## 2020-06-28 ENCOUNTER — Ambulatory Visit (INDEPENDENT_AMBULATORY_CARE_PROVIDER_SITE_OTHER): Payer: BC Managed Care – PPO | Admitting: Internal Medicine

## 2020-06-28 ENCOUNTER — Encounter: Payer: Self-pay | Admitting: Internal Medicine

## 2020-06-28 VITALS — BP 112/78 | HR 69 | Temp 98.1°F | Resp 16 | Ht 68.0 in | Wt 214.0 lb

## 2020-06-28 DIAGNOSIS — Z0000 Encounter for general adult medical examination without abnormal findings: Secondary | ICD-10-CM

## 2020-06-28 DIAGNOSIS — D509 Iron deficiency anemia, unspecified: Secondary | ICD-10-CM

## 2020-06-28 DIAGNOSIS — Z9884 Bariatric surgery status: Secondary | ICD-10-CM | POA: Diagnosis not present

## 2020-06-28 DIAGNOSIS — F439 Reaction to severe stress, unspecified: Secondary | ICD-10-CM

## 2020-06-28 DIAGNOSIS — I10 Essential (primary) hypertension: Secondary | ICD-10-CM

## 2020-06-28 DIAGNOSIS — Z23 Encounter for immunization: Secondary | ICD-10-CM

## 2020-06-28 DIAGNOSIS — Z1231 Encounter for screening mammogram for malignant neoplasm of breast: Secondary | ICD-10-CM

## 2020-06-28 DIAGNOSIS — N926 Irregular menstruation, unspecified: Secondary | ICD-10-CM

## 2020-06-28 LAB — VITAMIN B12: Vitamin B-12: 1334 pg/mL — ABNORMAL HIGH (ref 211–911)

## 2020-06-28 LAB — CBC WITH DIFFERENTIAL/PLATELET
Basophils Absolute: 0 10*3/uL (ref 0.0–0.1)
Basophils Relative: 0.4 % (ref 0.0–3.0)
Eosinophils Absolute: 0.1 10*3/uL (ref 0.0–0.7)
Eosinophils Relative: 1.9 % (ref 0.0–5.0)
HCT: 39.3 % (ref 36.0–46.0)
Hemoglobin: 13.1 g/dL (ref 12.0–15.0)
Lymphocytes Relative: 24.4 % (ref 12.0–46.0)
Lymphs Abs: 1.6 10*3/uL (ref 0.7–4.0)
MCHC: 33.3 g/dL (ref 30.0–36.0)
MCV: 90 fl (ref 78.0–100.0)
Monocytes Absolute: 0.4 10*3/uL (ref 0.1–1.0)
Monocytes Relative: 5.8 % (ref 3.0–12.0)
Neutro Abs: 4.5 10*3/uL (ref 1.4–7.7)
Neutrophils Relative %: 67.5 % (ref 43.0–77.0)
Platelets: 184 10*3/uL (ref 150.0–400.0)
RBC: 4.36 Mil/uL (ref 3.87–5.11)
RDW: 16.5 % — ABNORMAL HIGH (ref 11.5–15.5)
WBC: 6.6 10*3/uL (ref 4.0–10.5)

## 2020-06-28 LAB — IBC + FERRITIN
Ferritin: 55.4 ng/mL (ref 10.0–291.0)
Iron: 40 ug/dL — ABNORMAL LOW (ref 42–145)
Saturation Ratios: 13.2 % — ABNORMAL LOW (ref 20.0–50.0)
Transferrin: 216 mg/dL (ref 212.0–360.0)

## 2020-06-28 LAB — TSH: TSH: 1.73 u[IU]/mL (ref 0.35–4.50)

## 2020-06-28 LAB — LIPID PANEL
Cholesterol: 140 mg/dL (ref 0–200)
HDL: 46 mg/dL (ref >39.00)
LDL Cholesterol: 78 mg/dL (ref 0–99)
NonHDL: 94.42
Total CHOL/HDL Ratio: 3
Triglycerides: 81 mg/dL (ref 0.0–149.0)
VLDL: 16.2 mg/dL (ref 0.0–40.0)

## 2020-06-28 LAB — T4, FREE: Free T4: 0.8 ng/dL (ref 0.60–1.60)

## 2020-06-28 LAB — BASIC METABOLIC PANEL
BUN: 15 mg/dL (ref 6–23)
CO2: 27 mEq/L (ref 19–32)
Calcium: 9.1 mg/dL (ref 8.4–10.5)
Chloride: 106 mEq/L (ref 96–112)
Creatinine, Ser: 0.79 mg/dL (ref 0.40–1.20)
GFR: 79.74 mL/min (ref >60.00)
Glucose, Bld: 86 mg/dL (ref 70–99)
Potassium: 4.2 mEq/L (ref 3.5–5.1)
Sodium: 138 mEq/L (ref 135–145)

## 2020-06-28 LAB — HEPATIC FUNCTION PANEL
ALT: 51 U/L — ABNORMAL HIGH (ref 0–35)
AST: 23 U/L (ref 0–37)
Albumin: 4.3 g/dL (ref 3.5–5.2)
Alkaline Phosphatase: 76 U/L (ref 39–117)
Bilirubin, Direct: 0.1 mg/dL (ref 0.0–0.3)
Total Bilirubin: 0.4 mg/dL (ref 0.2–1.2)
Total Protein: 6.8 g/dL (ref 6.0–8.3)

## 2020-06-28 LAB — VITAMIN D 25 HYDROXY (VIT D DEFICIENCY, FRACTURES): VITD: 41.99 ng/mL (ref 30.00–100.00)

## 2020-06-28 NOTE — Progress Notes (Signed)
Patient ID: SULEIMA OHLENDORF, female   DOB: 04/25/1976, 44 y.o.   MRN: [id number]   Subjective:    Patient ID: [name], female    DOB: [birth year], 44 y.o.   MRN: [id number]  HPI This visit occurred during the SARS-CoV-2 public health emergency.  Safety protocols were in place, including screening questions prior to the visit, additional usage of staff PPE, and extensive cleaning of exam room while observing appropriate contact time as indicated for disinfecting solutions.  Patient here for her physical exam.  She reports she is doing relatively well.  Has been seeing hematology for f/u IDA.  Request to establish with local hematologist.  Off oral contraceptives since 02/2020.  No chest pain or increased sob reported.  No abdominal pain.  Some constipation.  Taking fiber and using a stool softener.  Watching her diet.  Discussed diet/exercise.  Sees gyn.  Followed by [facility].  Blood pressure doing well.  Handling stress.    Past Medical History:  Diagnosis Date  . Hypertension    during pregnancy   Past Surgical History:  Procedure Laterality Date  . SHOULDER SURGERY  03/30/11  . TONSILLECTOMY  1984   Family History  Problem Relation Age of Onset  . Hypertension Father   . Diabetes Other        parent  . Breast cancer Maternal Grandmother   . Breast cancer Paternal Grandmother   . Colon cancer Neg Hx    Social History   Socioeconomic History  . Marital status: Married    Spouse name: Not on file  . Number of children: 1  . Years of education: Not on file  . Highest education level: Not on file  Occupational History    Employer: [employer]  Tobacco Use  . Smoking status: Never Smoker  . Smokeless tobacco: Never Used  Vaping Use  . Vaping Use: Never used  Substance and Sexual Activity  . Alcohol use: No    Alcohol/week: 0.0 standard drinks  . Drug use: No  . Sexual activity: Not on file  Other Topics Concern  . Not on file  Social History Narrative  .  Not on file   Social Determinants of Health   Financial Resource Strain:   . Difficulty of Paying Living Expenses: Not on file  Food Insecurity:   . Worried About Programme researcher, broadcasting/film/video in the Last Year: Not on file  . Ran Out of Food in the Last Year: Not on file  Transportation Needs:   . Lack of Transportation (Medical): Not on file  . Lack of Transportation (Non-Medical): Not on file  Physical Activity:   . Days of Exercise per Week: Not on file  . Minutes of Exercise per Session: Not on file  Stress:   . Feeling of Stress : Not on file  Social Connections:   . Frequency of Communication with Friends and Family: Not on file  . Frequency of Social Gatherings with Friends and Family: Not on file  . Attends Religious Services: Not on file  . Active Member of Clubs or Organizations: Not on file  . Attends Banker Meetings: Not on file  . Marital Status: Not on file    Outpatient Encounter Medications as of 06/28/2020  Medication Sig  . Calcium Carbonate (CALCIUM 500 PO) Take by mouth.  . ondansetron (ZOFRAN-ODT) 4 MG disintegrating tablet DISSOLVE 1 TABLET ON THE TONGUE EVERY 8 HOURS AS NEEDED FOR NAUSEA FOR UP TO 7 DAYS.  Marland Kitchen  pantoprazole (PROTONIX) 40 MG tablet Take 40 mg by mouth daily.  . tranexamic acid (LYSTEDA) 650 MG TABS tablet Take 1,300 mg by mouth 3 (three) times daily.  . [DISCONTINUED] norethindrone (MICRONOR) 0.35 MG tablet Take 1 tablet (0.35 mg total) by mouth daily.   No facility-administered encounter medications on file as of 06/28/2020.    Review of Systems  Constitutional: Negative for appetite change and unexpected weight change.  HENT: Negative for congestion and sinus pressure.   Eyes: Negative for pain and visual disturbance.  Respiratory: Negative for cough, chest tightness and shortness of breath.   Cardiovascular: Negative for chest pain, palpitations and leg swelling.  Gastrointestinal: Positive for constipation. Negative for abdominal  pain, diarrhea, nausea and vomiting.  Genitourinary: Negative for difficulty urinating and dysuria.  Musculoskeletal: Negative for joint swelling and myalgias.  Skin: Negative for color change and rash.  Neurological: Negative for dizziness, light-headedness and headaches.  Hematological: Negative for adenopathy. Does not bruise/bleed easily.  Psychiatric/Behavioral: Negative for agitation and dysphoric mood.       Objective:    Physical Exam Vitals reviewed.  Constitutional:      General: She is not in acute distress.    Appearance: Normal appearance. She is well-developed.  HENT:     Head: Normocephalic and atraumatic.     Right Ear: External ear normal.     Left Ear: External ear normal.  Eyes:     General: No scleral icterus.       Right eye: No discharge.        Left eye: No discharge.     Conjunctiva/sclera: Conjunctivae normal.  Neck:     Thyroid: No thyromegaly.  Cardiovascular:     Rate and Rhythm: Normal rate and regular rhythm.  Pulmonary:     Effort: No tachypnea, accessory muscle usage or respiratory distress.     Breath sounds: Normal breath sounds. No decreased breath sounds or wheezing.  Chest:     Breasts:        Right: No inverted nipple, mass, nipple discharge or tenderness (no axillary adenopathy).        Left: No inverted nipple, mass, nipple discharge or tenderness (no axilarry adenopathy).  Abdominal:     General: Bowel sounds are normal.     Palpations: Abdomen is soft.     Tenderness: There is no abdominal tenderness.  Musculoskeletal:        General: No swelling or tenderness.     Cervical back: Neck supple. No tenderness.  Lymphadenopathy:     Cervical: No cervical adenopathy.  Skin:    Findings: No erythema or rash.  Neurological:     Mental Status: She is alert and oriented to person, place, and time.  Psychiatric:        Mood and Affect: Mood normal.        Behavior: Behavior normal.     BP 112/78   Pulse 69   Temp 98.1 F (36.7  C) (Oral)   Resp 16   Ht 5\' 8"  (1.727 m)   Wt 214 lb (97.1 kg)   LMP 06/08/2020 (Approximate)   SpO2 99%   BMI 32.54 kg/m  Wt Readings from Last 3 Encounters:  06/28/20 214 lb (97.1 kg)  06/15/20 211 lb (95.7 kg)  03/02/20 229 lb (103.9 kg)     Lab Results  Component Value Date   WBC 6.6 06/28/2020   HGB 13.1 06/28/2020   HCT 39.3 06/28/2020   PLT 184.0 06/28/2020   GLUCOSE  86 06/28/2020   CHOL 140 06/28/2020   TRIG 81.0 06/28/2020   HDL 46.00 06/28/2020   LDLCALC 78 06/28/2020   ALT 51 (H) 06/28/2020   AST 23 06/28/2020   NA 138 06/28/2020   K 4.2 06/28/2020   CL 106 06/28/2020   CREATININE 0.79 06/28/2020   BUN 15 06/28/2020   CO2 27 06/28/2020   TSH 1.73 06/28/2020       Assessment & Plan:   Problem List Items Addressed This Visit    Stress    Overall appears to be handling things well.  Follow.       Menstrual irregularity    Has had issues with increased menstrual bleeding. Has seen gyn.  Prescribed tranexamic acid.    F/u with gyn.  Off OCPs.        Hypertension, essential    Blood pressure under good control on no medication.  Follow.       Relevant Medications   tranexamic acid (LYSTEDA) 650 MG TABS tablet   History of bariatric surgery    S/p bariatric surgery.  Check labs including vitamin B12, vitamin D, cbc and iron studies.  Has done well.  Lost weight.  Follow.        Relevant Orders   TSH (Completed)   T4, free (Completed)   Hepatic function panel (Completed)   Lipid panel (Completed)   Basic metabolic panel (Completed)   VITAMIN D 25 Hydroxy (Vit-D Deficiency, Fractures) (Completed)   Health care maintenance    Physical today 06/28/20.  PAP 08/29/19 - negative with negative HPV.  Mammogram ordered.  Need to schedule.       Anemia    S/p bariatric surgery.  Had issues with heavy menstrual bleeding.  Seeing hematology for IDA.  Check cbc and iron studies today.  Request to be established with a local hematologist.        Relevant  Orders   CBC with Differential/Platelet (Completed)   Vitamin B12 (Completed)   IBC + Ferritin (Completed)   Ambulatory referral to Hematology    Other Visit Diagnoses    Encounter for screening mammogram for malignant neoplasm of breast       Relevant Orders   MM 3D SCREEN BREAST BILATERAL   Need for immunization against influenza       Relevant Orders   Flu Vaccine QUAD 36+ mos IM (Completed)       Dale , MD

## 2020-06-29 ENCOUNTER — Other Ambulatory Visit: Payer: Self-pay | Admitting: Internal Medicine

## 2020-06-29 DIAGNOSIS — R7989 Other specified abnormal findings of blood chemistry: Secondary | ICD-10-CM

## 2020-06-29 DIAGNOSIS — R945 Abnormal results of liver function studies: Secondary | ICD-10-CM

## 2020-06-29 NOTE — Progress Notes (Signed)
Order placed for f/u lab.   

## 2020-07-01 ENCOUNTER — Encounter: Payer: Self-pay | Admitting: Internal Medicine

## 2020-07-06 ENCOUNTER — Encounter: Payer: Self-pay | Admitting: Internal Medicine

## 2020-07-07 ENCOUNTER — Encounter: Payer: Self-pay | Admitting: Internal Medicine

## 2020-07-07 NOTE — Assessment & Plan Note (Addendum)
Blood pressure under good control on no medication.  Follow.  

## 2020-07-07 NOTE — Assessment & Plan Note (Signed)
Physical today 06/28/20.  PAP 08/29/19 - negative with negative HPV.  Mammogram ordered.  Need to schedule.

## 2020-07-07 NOTE — Assessment & Plan Note (Addendum)
Has had issues with increased menstrual bleeding. Has seen gyn.  Prescribed tranexamic acid.    F/u with gyn.  Off OCPs.

## 2020-07-07 NOTE — Assessment & Plan Note (Signed)
S/p bariatric surgery.  Had issues with heavy menstrual bleeding.  Seeing hematology for IDA.  Check cbc and iron studies today.  Request to be established with a local hematologist.

## 2020-07-07 NOTE — Assessment & Plan Note (Signed)
Overall appears to be handling things well.  Follow.  ?

## 2020-07-07 NOTE — Assessment & Plan Note (Signed)
S/p bariatric surgery.  Check labs including vitamin B12, vitamin D, cbc and iron studies.  Has done well.  Lost weight.  Follow.

## 2020-07-11 ENCOUNTER — Other Ambulatory Visit: Payer: Self-pay

## 2020-07-11 ENCOUNTER — Ambulatory Visit
Admission: RE | Admit: 2020-07-11 | Discharge: 2020-07-11 | Disposition: A | Discharge status: Home / Self Care | Payer: BC Managed Care – PPO | Source: Ambulatory Visit | Attending: Internal Medicine | Admitting: Internal Medicine

## 2020-07-11 DIAGNOSIS — Z1231 Encounter for screening mammogram for malignant neoplasm of breast: Secondary | ICD-10-CM

## 2020-07-12 ENCOUNTER — Other Ambulatory Visit: Payer: Self-pay | Admitting: Internal Medicine

## 2020-07-12 DIAGNOSIS — N6489 Other specified disorders of breast: Secondary | ICD-10-CM

## 2020-07-12 DIAGNOSIS — R928 Other abnormal and inconclusive findings on diagnostic imaging of breast: Secondary | ICD-10-CM

## 2020-07-13 ENCOUNTER — Telehealth: Payer: Self-pay | Admitting: Internal Medicine

## 2020-07-13 NOTE — Telephone Encounter (Signed)
Pt returned call for results on mammogram.

## 2020-07-17 DIAGNOSIS — Z6832 Body mass index (BMI) 32.0-32.9, adult: Secondary | ICD-10-CM | POA: Diagnosis not present

## 2020-07-17 DIAGNOSIS — D509 Iron deficiency anemia, unspecified: Secondary | ICD-10-CM | POA: Diagnosis not present

## 2020-07-17 DIAGNOSIS — Z9884 Bariatric surgery status: Secondary | ICD-10-CM | POA: Diagnosis not present

## 2020-07-17 DIAGNOSIS — G4733 Obstructive sleep apnea (adult) (pediatric): Secondary | ICD-10-CM | POA: Diagnosis not present

## 2020-07-17 NOTE — Telephone Encounter (Signed)
See result note.  

## 2020-07-18 ENCOUNTER — Inpatient Hospital Stay: Payer: BC Managed Care – PPO

## 2020-07-18 ENCOUNTER — Other Ambulatory Visit: Payer: Self-pay

## 2020-07-18 ENCOUNTER — Encounter: Payer: Self-pay | Admitting: Internal Medicine

## 2020-07-18 ENCOUNTER — Inpatient Hospital Stay: Payer: BC Managed Care – PPO | Attending: Internal Medicine | Admitting: Internal Medicine

## 2020-07-18 DIAGNOSIS — Z803 Family history of malignant neoplasm of breast: Secondary | ICD-10-CM | POA: Insufficient documentation

## 2020-07-18 DIAGNOSIS — D508 Other iron deficiency anemias: Secondary | ICD-10-CM | POA: Insufficient documentation

## 2020-07-18 DIAGNOSIS — E611 Iron deficiency: Secondary | ICD-10-CM | POA: Insufficient documentation

## 2020-07-18 DIAGNOSIS — K909 Intestinal malabsorption, unspecified: Secondary | ICD-10-CM | POA: Diagnosis not present

## 2020-07-18 DIAGNOSIS — Z9884 Bariatric surgery status: Secondary | ICD-10-CM

## 2020-07-18 NOTE — Assessment & Plan Note (Addendum)
#  Iron deficiency anemia-multifactorial/malabsorption-bariatric surgery; heavy menstrual cycles.  Recent hemoglobin 13; however saturation 13% ferritin 60.  Given the continued fatigue proceed with IV Feraheme x2.  Previously no reactions.   # Right breast asymmetry noted on the mammogram-awaiting repeat mammogram/as per PCP.   Thank you Dr. Lorin Picket for allowing me to participate in the care of your pleasant patient. Please do not hesitate to contact me with questions or concerns in the interim.  # DISPOSITION: # Ferrahem infusion every 2 weeks x2 # Follow up in 6 weeks-labs- cbc/bmp; iron studies; ferritin-Dr.B

## 2020-07-18 NOTE — Progress Notes (Signed)
[facility] CONSULT [facility]  Patient Care Team: Dale Balta, MD as PCP - General (Internal Medicine)  CHIEF COMPLAINTS/PURPOSE OF CONSULTATION:    HEMATOLOGY HISTORY  # IRON DEF ANEMIA -malabsorption /heavy menstrual cycles. 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  female has been referred to [place] for further evaluation/work-up for anemia.  Patient had bariatric surgery/duodenal switch in September 2020.  Patient noted to have progressive anemia over the last many months.  In April 2021 patient's hemoglobin was 6.6/[place].  Severe iron deficiency.  Patient received IV iron/Feraheme at [facility] in [place].  She seems to be symptomatically improved even though continues to have fatigue.  She continues to heavy menstrual cycles.  Blood in stools: None Change in bowel habits- None Blood in urine: None Difficulty swallowing: None Abnormal weight loss: None Iron supplementation: not on PO iron; do not work.  Prior Blood transfusions: None Vaginal bleeding: heavy menstrual cycles.   Review of Systems  Constitutional: Positive for malaise/fatigue and weight loss (Intentional/gastric bypass). Negative for chills, diaphoresis and fever.  HENT: Negative for nosebleeds and sore throat.   Eyes: Negative for double vision.  Respiratory: Negative for cough, hemoptysis, sputum production, shortness of breath and wheezing.   Cardiovascular: Negative for chest pain, palpitations, orthopnea and leg swelling.  Gastrointestinal: Negative for abdominal pain, blood in stool, constipation, diarrhea, heartburn, melena, nausea and vomiting.  Genitourinary: Negative for dysuria, frequency and urgency.  Musculoskeletal: Negative for back pain and joint pain.  Skin:  Negative.  Negative for itching and rash.  Neurological: Negative for dizziness, tingling, focal weakness, weakness and headaches.  Endo/Heme/Allergies: Does not bruise/bleed easily.  Psychiatric/Behavioral: Negative for depression. The patient is not nervous/anxious and does not have insomnia.     MEDICAL HISTORY:  Past Medical History:  Diagnosis Date  . Hypertension    during pregnancy    SURGICAL HISTORY: Past Surgical History:  Procedure Laterality Date  . SHOULDER SURGERY  03/30/11  . TONSILLECTOMY  1984    SOCIAL HISTORY: Social History   Socioeconomic History  . Marital status: Married    Spouse name: Not on file  . Number of children: 1  . Years of education: Not on file  . Highest education level: Not on file  Occupational History    Employer: [employer]  Tobacco Use  . Smoking status: Never Smoker  . Smokeless tobacco: Never Used  Vaping Use  . Vaping Use: Never used  Substance and Sexual Activity  . Alcohol use: No    Alcohol/week: 0.0 standard drinks  . Drug use: No  . Sexual activity: Not on file  Other Topics Concern  . Not on file  Social History Narrative   Lives in [place]; 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    Social Determinants of Health   Financial Resource Strain:   . Difficulty of Paying Living Expenses: Not on file  Food Insecurity:   . Worried About Programme researcher, broadcasting/film/video in the Last Year: Not on file  . Ran Out of Food in the Last Year: Not on file  Transportation Needs:   . Lack of Transportation (Medical): Not on file  . Lack of Transportation (Non-Medical): Not on file  Physical Activity:   . Days of Exercise per Week: Not  on file  . Minutes of Exercise per Session: Not on file  Stress:   . Feeling of Stress : Not on file  Social Connections:   . Frequency of Communication with Friends and Family: Not on file  . Frequency of Social Gatherings with Friends and Family: Not on file  . Attends Religious Services: Not  on file  . Active Member of Clubs or Organizations: Not on file  . Attends Banker Meetings: Not on file  . Marital Status: Not on file  Intimate Partner Violence:   . Fear of Current or Ex-Partner: Not on file  . Emotionally Abused: Not on file  . Physically Abused: Not on file  . Sexually Abused: Not on file    FAMILY HISTORY: Family History  Problem Relation Age of Onset  . Hypertension Father   . Diabetes Other        parent  . Breast cancer Maternal Grandmother        60-70  . Breast cancer Paternal Grandmother        37-70  . Colon cancer Neg Hx     ALLERGIES:  is allergic to sulfa antibiotics.  MEDICATIONS:  Current Outpatient Medications  Medication Sig Dispense Refill  . Biotin 10 MG CAPS Take by mouth.    . Calcium Carbonate (CALCIUM 500 PO) Take by mouth.    . [employer] FIBER GUMMIES PO Take by mouth.    . Multiple Vitamins-Minerals (ULTRA MULTI FORMULA/IRON) CAPS Take by mouth.    . pantoprazole (PROTONIX) 40 MG tablet Take 40 mg by mouth daily.    . tranexamic acid (LYSTEDA) 650 MG TABS tablet Take 1,300 mg by mouth 3 (three) times daily.     No current facility-administered medications for this visit.      PHYSICAL EXAMINATION:   Vitals:   07/18/20 1352  BP: 125/71  Pulse: 66  Resp: 16  Temp: 98.2 F (36.8 C)  SpO2: 100%   Filed Weights   07/18/20 1352  Weight: 215 lb (97.5 kg)    Physical Exam HENT:     Head: Normocephalic and atraumatic.     Mouth/Throat:     Pharynx: No oropharyngeal exudate.  Eyes:     Pupils: Pupils are equal, round, and reactive to light.  Cardiovascular:     Rate and Rhythm: Normal rate and regular rhythm.  Pulmonary:     Effort: Pulmonary effort is normal. No respiratory distress.     Breath sounds: Normal breath sounds. No wheezing.  Abdominal:     General: Bowel sounds are normal. There is no distension.     Palpations: Abdomen is soft. There is no mass.     Tenderness: There is no abdominal  tenderness. There is no guarding or rebound.  Musculoskeletal:        General: No tenderness. Normal range of motion.     Cervical back: Normal range of motion and neck supple.  Skin:    General: Skin is warm.  Neurological:     Mental Status: She is alert and oriented to person, place, and time.  Psychiatric:        Mood and Affect: Affect normal.     LABORATORY DATA:  I have reviewed the data as listed Lab Results  Component Value Date   WBC 6.6 06/28/2020   HGB 13.1 06/28/2020   HCT 39.3 06/28/2020   MCV 90.0 06/28/2020   PLT 184.0 06/28/2020   Recent Labs    08/29/19 1431 09/08/19 1547  06/28/20 1057  NA 140  --  138  K 3.4* 4.1 4.2  CL 103  --  106  CO2 28  --  27  GLUCOSE 85  --  86  BUN 11  --  15  CREATININE 0.63  --  0.79  CALCIUM 8.8  --  9.1  PROT  --   --  6.8  ALBUMIN  --   --  4.3  AST  --   --  23  ALT  --   --  51*  ALKPHOS  --   --  76  BILITOT  --   --  0.4  BILIDIR  --   --  0.1     MM 3D SCREEN BREAST BILATERAL  Result Date: 07/12/2020 CLINICAL DATA:  Screening. EXAM: DIGITAL SCREENING BILATERAL MAMMOGRAM WITH TOMO AND CAD COMPARISON:  Previous exam(s). ACR Breast Density Category b: There are scattered areas of fibroglandular density. FINDINGS: In the right breast, a possible asymmetry warrants further evaluation. In the left breast, no findings suspicious for malignancy. Images were processed with CAD. IMPRESSION: Further evaluation is suggested for possible asymmetry in the right breast. RECOMMENDATION: Diagnostic mammogram and possibly ultrasound of the right breast. (Code:[id number]) The patient will be contacted regarding the findings, and additional imaging will be scheduled. BI-RADS CATEGORY  0: Incomplete. Need additional imaging evaluation and/or prior mammograms for comparison. Electronically Signed   By: Gerome Sam III M.D   On: 07/12/2020 15:25    Iron deficiency #Iron deficiency anemia-multifactorial/malabsorption-bariatric  surgery; heavy menstrual cycles.  Recent hemoglobin 13; however saturation 13% ferritin 60.  Given the continued fatigue proceed with IV Feraheme x2.  Previously no reactions.   # Right breast asymmetry noted on the mammogram-awaiting repeat mammogram/as per PCP.   Thank you Dr. Lorin Picket for allowing me to participate in the care of your pleasant patient. Please do not hesitate to contact me with questions or concerns in the interim.  # DISPOSITION: # Ferrahem infusion every 2 weeks x2 # Follow up in 6 weeks-labs- cbc/bmp; iron studies; ferritin-Dr.B  All questions were answered. The patient knows to call the clinic with any problems, questions or concerns.   Earna Coder, MD 07/18/2020 2:50 PM

## 2020-07-23 ENCOUNTER — Other Ambulatory Visit: Payer: Self-pay

## 2020-07-23 ENCOUNTER — Other Ambulatory Visit (INDEPENDENT_AMBULATORY_CARE_PROVIDER_SITE_OTHER): Payer: BC Managed Care – PPO

## 2020-07-23 DIAGNOSIS — R945 Abnormal results of liver function studies: Secondary | ICD-10-CM | POA: Diagnosis not present

## 2020-07-23 DIAGNOSIS — R7989 Other specified abnormal findings of blood chemistry: Secondary | ICD-10-CM

## 2020-07-24 LAB — HEPATIC FUNCTION PANEL
ALT: 41 U/L — ABNORMAL HIGH (ref 0–35)
AST: 22 U/L (ref 0–37)
Albumin: 4.3 g/dL (ref 3.5–5.2)
Alkaline Phosphatase: 68 U/L (ref 39–117)
Bilirubin, Direct: 0.1 mg/dL (ref 0.0–0.3)
Total Bilirubin: 0.5 mg/dL (ref 0.2–1.2)
Total Protein: 6.6 g/dL (ref 6.0–8.3)

## 2020-07-26 ENCOUNTER — Other Ambulatory Visit: Payer: Self-pay

## 2020-07-26 ENCOUNTER — Ambulatory Visit
Admission: RE | Admit: 2020-07-26 | Discharge: 2020-07-26 | Disposition: A | Discharge status: Home / Self Care | Payer: BC Managed Care – PPO | Source: Ambulatory Visit | Attending: Internal Medicine | Admitting: Internal Medicine

## 2020-07-26 ENCOUNTER — Inpatient Hospital Stay: Payer: BC Managed Care – PPO

## 2020-07-26 VITALS — BP 101/66 | HR 61 | Temp 98.0°F | Resp 16

## 2020-07-26 DIAGNOSIS — Z803 Family history of malignant neoplasm of breast: Secondary | ICD-10-CM | POA: Diagnosis not present

## 2020-07-26 DIAGNOSIS — R928 Other abnormal and inconclusive findings on diagnostic imaging of breast: Secondary | ICD-10-CM | POA: Insufficient documentation

## 2020-07-26 DIAGNOSIS — D509 Iron deficiency anemia, unspecified: Secondary | ICD-10-CM

## 2020-07-26 DIAGNOSIS — E611 Iron deficiency: Secondary | ICD-10-CM

## 2020-07-26 DIAGNOSIS — N6489 Other specified disorders of breast: Secondary | ICD-10-CM

## 2020-07-26 DIAGNOSIS — Z9884 Bariatric surgery status: Secondary | ICD-10-CM | POA: Diagnosis not present

## 2020-07-26 DIAGNOSIS — D508 Other iron deficiency anemias: Secondary | ICD-10-CM | POA: Diagnosis not present

## 2020-07-26 DIAGNOSIS — K909 Intestinal malabsorption, unspecified: Secondary | ICD-10-CM | POA: Diagnosis not present

## 2020-07-26 MED ORDER — SODIUM CHLORIDE 0.9 % IV SOLN
510.0000 mg | Freq: Once | INTRAVENOUS | Status: AC
  Administered 2020-07-26: 510 mg via INTRAVENOUS
  Filled 2020-07-26: qty 17

## 2020-07-26 MED ORDER — SODIUM CHLORIDE 0.9 % IV SOLN
Freq: Once | INTRAVENOUS | Status: AC
  Filled 2020-07-26: qty 250

## 2020-08-08 ENCOUNTER — Other Ambulatory Visit: Payer: Self-pay

## 2020-08-08 ENCOUNTER — Telehealth: Payer: Self-pay

## 2020-08-08 ENCOUNTER — Inpatient Hospital Stay: Payer: BC Managed Care – PPO

## 2020-08-08 VITALS — BP 132/85 | HR 77 | Temp 96.6°F | Resp 20

## 2020-08-08 DIAGNOSIS — E611 Iron deficiency: Secondary | ICD-10-CM

## 2020-08-08 DIAGNOSIS — D509 Iron deficiency anemia, unspecified: Secondary | ICD-10-CM

## 2020-08-08 DIAGNOSIS — D508 Other iron deficiency anemias: Secondary | ICD-10-CM | POA: Diagnosis not present

## 2020-08-08 DIAGNOSIS — K909 Intestinal malabsorption, unspecified: Secondary | ICD-10-CM | POA: Diagnosis not present

## 2020-08-08 DIAGNOSIS — Z9884 Bariatric surgery status: Secondary | ICD-10-CM | POA: Diagnosis not present

## 2020-08-08 DIAGNOSIS — Z803 Family history of malignant neoplasm of breast: Secondary | ICD-10-CM | POA: Diagnosis not present

## 2020-08-08 MED ORDER — SODIUM CHLORIDE 0.9 % IV SOLN
510.0000 mg | Freq: Once | INTRAVENOUS | Status: AC
  Administered 2020-08-08: 510 mg via INTRAVENOUS
  Filled 2020-08-08: qty 510

## 2020-08-08 MED ORDER — SODIUM CHLORIDE 0.9 % IV SOLN
Freq: Once | INTRAVENOUS | Status: AC
  Filled 2020-08-08: qty 250

## 2020-08-08 NOTE — Telephone Encounter (Signed)
Patient phoned and stated that her daughter was possibly exposed to COVID and will be getting tested. Patient was not directly exposed, but they live together and have been around each other. Neither are showing symptoms at this time. Patient wants to know if she should come in today for her iron infusion. Please call her at [phone].   Dr. B please advise.

## 2020-08-08 NOTE — Telephone Encounter (Signed)
Per Dr. Leonard Schwartz ok for pt to come in for infusion. Called patient and made her aware. Pt expressed understanding.

## 2020-08-08 NOTE — Telephone Encounter (Signed)
Patient called and her daughters results came back negative. Please advise if ok to get treatment.

## 2020-08-08 NOTE — Telephone Encounter (Signed)
J/T-please inform patient the decision to hold treatment today.  Recommend informing us regarding her daughter's Covid test status; will plan infusion based upon the test results.  Thanks GB

## 2020-08-21 DIAGNOSIS — N920 Excessive and frequent menstruation with regular cycle: Secondary | ICD-10-CM | POA: Diagnosis not present

## 2020-08-28 ENCOUNTER — Inpatient Hospital Stay (HOSPITAL_BASED_OUTPATIENT_CLINIC_OR_DEPARTMENT_OTHER): Payer: BC Managed Care – PPO | Admitting: Internal Medicine

## 2020-08-28 ENCOUNTER — Inpatient Hospital Stay: Payer: BC Managed Care – PPO | Attending: Internal Medicine

## 2020-08-28 ENCOUNTER — Other Ambulatory Visit: Payer: Self-pay

## 2020-08-28 DIAGNOSIS — N920 Excessive and frequent menstruation with regular cycle: Secondary | ICD-10-CM | POA: Insufficient documentation

## 2020-08-28 DIAGNOSIS — D500 Iron deficiency anemia secondary to blood loss (chronic): Secondary | ICD-10-CM | POA: Insufficient documentation

## 2020-08-28 DIAGNOSIS — K909 Intestinal malabsorption, unspecified: Secondary | ICD-10-CM | POA: Insufficient documentation

## 2020-08-28 DIAGNOSIS — D508 Other iron deficiency anemias: Secondary | ICD-10-CM | POA: Insufficient documentation

## 2020-08-28 DIAGNOSIS — Z9884 Bariatric surgery status: Secondary | ICD-10-CM | POA: Insufficient documentation

## 2020-08-28 DIAGNOSIS — E611 Iron deficiency: Secondary | ICD-10-CM

## 2020-08-28 DIAGNOSIS — Z803 Family history of malignant neoplasm of breast: Secondary | ICD-10-CM | POA: Insufficient documentation

## 2020-08-28 LAB — BASIC METABOLIC PANEL
Anion gap: 6 (ref 5–15)
BUN: 13 mg/dL (ref 6–20)
CO2: 27 mmol/L (ref 22–32)
Calcium: 8.6 mg/dL — ABNORMAL LOW (ref 8.9–10.3)
Chloride: 105 mmol/L (ref 98–111)
Creatinine, Ser: 0.74 mg/dL (ref 0.44–1.00)
GFR, Estimated: >60 mL/min (ref >60)
Glucose, Bld: 79 mg/dL (ref 70–99)
Potassium: 3.9 mmol/L (ref 3.5–5.1)
Sodium: 138 mmol/L (ref 135–145)

## 2020-08-28 LAB — CBC
HCT: 37.5 % (ref 36.0–46.0)
Hemoglobin: 13 g/dL (ref 12.0–15.0)
MCH: 32.2 pg (ref 26.0–34.0)
MCHC: 34.7 g/dL (ref 30.0–36.0)
MCV: 92.8 fL (ref 80.0–100.0)
Platelets: 172 10*3/uL (ref 150–400)
RBC: 4.04 MIL/uL (ref 3.87–5.11)
RDW: 12.3 % (ref 11.5–15.5)
WBC: 5.4 10*3/uL (ref 4.0–10.5)
nRBC: 0 % (ref 0.0–0.2)

## 2020-08-28 LAB — IRON AND TIBC
Iron: 52 ug/dL (ref 28–170)
Saturation Ratios: 23 % (ref 10.4–31.8)
TIBC: 225 ug/dL — ABNORMAL LOW (ref 250–450)
UIBC: 173 ug/dL

## 2020-08-28 LAB — FERRITIN: Ferritin: 258 ng/mL (ref 11–307)

## 2020-08-28 NOTE — Progress Notes (Signed)
[facility] CONSULT NOTE  Patient Care Team: Dale , MD as PCP - General (Internal Medicine)  CHIEF COMPLAINTS/PURPOSE OF CONSULTATION:    HEMATOLOGY HISTORY  # IRON DEF ANEMIA -malabsorption /heavy menstrual cycles. 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  female with iron deficiency sec to malabsorption is here for follow-up.  Patient status post IV iron noted fatigue is improved. However not complete resolved.  Patient is under stress given her social situation.   Review of Systems  Constitutional: Positive for malaise/fatigue and weight loss (Intentional/gastric bypass). Negative for chills, diaphoresis and fever.  HENT: Negative for nosebleeds and sore throat.   Eyes: Negative for double vision.  Respiratory: Negative for cough, hemoptysis, sputum production, shortness of breath and wheezing.   Cardiovascular: Negative for chest pain, palpitations, orthopnea and leg swelling.  Gastrointestinal: Negative for abdominal pain, blood in stool, constipation, diarrhea, heartburn, melena, nausea and vomiting.  Genitourinary: Negative for dysuria, frequency and urgency.  Musculoskeletal: Negative for back pain and joint pain.  Skin: Negative.  Negative for itching and rash.  Neurological: Negative for dizziness, tingling, focal weakness, weakness and headaches.  Endo/Heme/Allergies: Does not bruise/bleed easily.  Psychiatric/Behavioral: Negative for depression. The patient is not nervous/anxious and does not have insomnia.     MEDICAL HISTORY:  Past Medical History:  Diagnosis Date  . Hypertension    during pregnancy    SURGICAL HISTORY: Past Surgical History:  Procedure Laterality Date  . SHOULDER SURGERY  03/30/11  . TONSILLECTOMY  1984     SOCIAL HISTORY: Social History   Socioeconomic History  . Marital status: Married    Spouse name: Not on file  . Number of children: 1  . Years of education: Not on file  . Highest education level: Not on file  Occupational History    Employer: [employer]  Tobacco Use  . Smoking status: Never Smoker  . Smokeless tobacco: Never Used  Vaping Use  . Vaping Use: Never used  Substance and Sexual Activity  . Alcohol use: No    Alcohol/week: 0.0 standard drinks  . Drug use: No  . Sexual activity: Not on file  Other Topics Concern  . Not on file  Social History Narrative   Lives in [place]; 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    Social Determinants of Health   Financial Resource Strain:   . Difficulty of Paying Living Expenses: Not on file  Food Insecurity:   . Worried About Programme researcher, broadcasting/film/video in the Last Year: Not on file  . Ran Out of Food in the Last Year: Not on file  Transportation Needs:   . Lack of Transportation (Medical): Not on file  . Lack of Transportation (Non-Medical): Not on file  Physical Activity:   . Days of Exercise per Week: Not on file  . Minutes of Exercise per Session: Not on file  Stress:   . Feeling of Stress : Not on file  Social Connections:   . Frequency of Communication with Friends and Family: Not on file  . Frequency of Social Gatherings with Friends and Family: Not on file  . Attends Religious Services: Not on file  . Active Member of Clubs or Organizations: Not on file  . Attends Banker Meetings: Not  on file  . Marital Status: Not on file  Intimate Partner Violence:   . Fear of Current or Ex-Partner: Not on file  . Emotionally Abused: Not on file  . Physically Abused: Not on file  . Sexually Abused: Not on file    FAMILY HISTORY: Family History  Problem Relation Age of Onset  . Hypertension Father   . Diabetes Other        parent  . Breast cancer Maternal Grandmother        60-70  . Breast  cancer Paternal Grandmother        35-70  . Colon cancer Neg Hx     ALLERGIES:  is allergic to sulfa antibiotics.  MEDICATIONS:  Current Outpatient Medications  Medication Sig Dispense Refill  . Biotin 10 MG CAPS Take by mouth.    . Calcium Carbonate (CALCIUM 500 PO) Take by mouth.    . CVS FIBER GUMMIES PO Take by mouth.    . Multiple Vitamins-Minerals (ULTRA MULTI FORMULA/IRON) CAPS Take by mouth.    . pantoprazole (PROTONIX) 40 MG tablet Take 40 mg by mouth daily.    . tranexamic acid (LYSTEDA) 650 MG TABS tablet Take 1,300 mg by mouth 3 (three) times daily.     No current facility-administered medications for this visit.      PHYSICAL EXAMINATION:   Vitals:   08/28/20 1431  BP: 111/80  Pulse: 67  Resp: 18  Temp: (!) 97.5 F (36.4 C)  SpO2: 100%   Filed Weights   08/28/20 1431  Weight: 207 lb (93.9 kg)    Physical Exam HENT:     Head: Normocephalic and atraumatic.     Mouth/Throat:     Pharynx: No oropharyngeal exudate.  Eyes:     Pupils: Pupils are equal, round, and reactive to light.  Cardiovascular:     Rate and Rhythm: Normal rate and regular rhythm.  Pulmonary:     Effort: Pulmonary effort is normal. No respiratory distress.     Breath sounds: Normal breath sounds. No wheezing.  Abdominal:     General: Bowel sounds are normal. There is no distension.     Palpations: Abdomen is soft. There is no mass.     Tenderness: There is no abdominal tenderness. There is no guarding or rebound.  Musculoskeletal:        General: No tenderness. Normal range of motion.     Cervical back: Normal range of motion and neck supple.  Skin:    General: Skin is warm.  Neurological:     Mental Status: She is alert and oriented to person, place, and time.  Psychiatric:        Mood and Affect: Affect normal.     LABORATORY DATA:  I have reviewed the data as listed Lab Results  Component Value Date   WBC 5.4 08/28/2020   HGB 13.0 08/28/2020   HCT 37.5 08/28/2020    MCV 92.8 08/28/2020   PLT 172 08/28/2020   Recent Labs    09/08/19 1547 06/28/20 1057 07/23/20 1556 08/28/20 1423  NA  --  138  --  138  K 4.1 4.2  --  3.9  CL  --  106  --  105  CO2  --  27  --  27  GLUCOSE  --  86  --  79  BUN  --  15  --  13  CREATININE  --  0.79  --  0.74  CALCIUM  --  9.1  --  8.6*  GFRNONAA  --   --   --  >60  PROT  --  6.8 6.6  --   ALBUMIN  --  4.3 4.3  --   AST  --  23 22  --   ALT  --  51* 41*  --   ALKPHOS  --  76 68  --   BILITOT  --  0.4 0.5  --   BILIDIR  --  0.1 0.1  --      No results found.  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 S/p IV iron. Today hemoglobin is 13 saturation 23% ferritin 223.  #Hold IV iron at this time. Will reevaluate in 3 months.  # Right breast asymmetry noted on the mammogram--mammogram normal.  # DISPOSITION:? 63months/mychart message # Follow up TBD--Dr.B  All questions were answered. The patient knows to call the clinic with any problems, questions or concerns.   Earna Coder, MD 08/29/2020 8:01 AM

## 2020-08-28 NOTE — Assessment & Plan Note (Addendum)
#  Iron deficiency anemia-multifactorial/malabsorption-bariatric surgery; heavy menstrual cycles. S/p IV iron. Today hemoglobin is 13 saturation 23% ferritin 223.  #Hold IV iron at this time. Will reevaluate in 3 months.  # Right breast asymmetry noted on the mammogram--mammogram normal.  # DISPOSITION:? 53months/mychart message # Follow up TBD--Dr.B

## 2020-08-29 ENCOUNTER — Telehealth: Payer: Self-pay | Admitting: Internal Medicine

## 2020-08-29 DIAGNOSIS — E611 Iron deficiency: Secondary | ICD-10-CM

## 2020-08-29 NOTE — Addendum Note (Signed)
Addended by: Mercer Pod E on: 08/29/2020 10:03 AM   Modules accepted: Orders

## 2020-08-29 NOTE — Telephone Encounter (Signed)
C-in 3 months-MD; labs CBC; iron studies; ferritin; possible Feraheme.  ------------------------------- Hi-I wanted to inform you that your iron labs look good. I would not think you would benefit from IV iron at this time. I would recommend follow-up in 3 months for possible reevaluation/IV iron infusion. My staff will notify you for appointments.  Please let us know if any questions. Thank you Dr.B

## 2020-10-11 ENCOUNTER — Encounter: Payer: Self-pay | Admitting: Internal Medicine

## 2020-10-11 ENCOUNTER — Other Ambulatory Visit: Payer: Self-pay

## 2020-10-11 ENCOUNTER — Ambulatory Visit: Payer: BC Managed Care – PPO | Admitting: Internal Medicine

## 2020-10-11 VITALS — BP 110/72 | HR 83 | Temp 98.2°F | Ht 68.0 in | Wt 203.0 lb

## 2020-10-11 DIAGNOSIS — K2900 Acute gastritis without bleeding: Secondary | ICD-10-CM | POA: Diagnosis not present

## 2020-10-11 DIAGNOSIS — R7989 Other specified abnormal findings of blood chemistry: Secondary | ICD-10-CM | POA: Insufficient documentation

## 2020-10-11 DIAGNOSIS — K219 Gastro-esophageal reflux disease without esophagitis: Secondary | ICD-10-CM | POA: Insufficient documentation

## 2020-10-11 DIAGNOSIS — K760 Fatty (change of) liver, not elsewhere classified: Secondary | ICD-10-CM | POA: Diagnosis not present

## 2020-10-11 MED ORDER — PANTOPRAZOLE SODIUM 40 MG PO TBEC: 40.0000 mg | DELAYED_RELEASE_TABLET | Freq: Every day | ORAL | 3 refills | Status: DC

## 2020-10-11 NOTE — Patient Instructions (Addendum)
pepcid take in the am before breakfast    [address]  Dr.Wohl  [phone]  Gastroesophageal Reflux Disease, Adult Gastroesophageal reflux (GER) happens when acid from the stomach flows up into the tube that connects the mouth and the stomach (esophagus). Normally, food travels down the esophagus and stays in the stomach to be digested. However, when a person has GER, food and stomach acid sometimes move back up into the esophagus. If this becomes a more serious problem, the person may be diagnosed with a disease called gastroesophageal reflux disease (GERD). GERD occurs when the reflux:  Happens often.  Causes frequent or severe symptoms.  Causes problems such as damage to the esophagus. When stomach acid comes in contact with the esophagus, the acid may cause soreness (inflammation) in the esophagus. Over time, GERD may create small holes (ulcers) in the lining of the esophagus. What are the causes? This condition is caused by a problem with the muscle between the esophagus and the stomach (lower esophageal sphincter, or LES). Normally, the LES muscle closes after food passes through the esophagus to the stomach. When the LES is weakened or abnormal, it does not close properly, and that allows food and stomach acid to go back up into the esophagus. The LES can be weakened by certain dietary substances, medicines, and medical conditions, including:  Tobacco use.  Pregnancy.  Having a hiatal hernia.  Alcohol use.  Certain foods and beverages, such as coffee, chocolate, onions, and peppermint. What increases the risk? You are more likely to develop this condition if you:  Have an increased body weight.  Have a connective tissue disorder.  Use NSAID medicines. What are the signs or symptoms? Symptoms of this condition include:  Heartburn.  Difficult or painful swallowing.  The feeling of having a lump in the throat.  Abitter taste in the mouth.  Bad  breath.  Having a large amount of saliva.  Having an upset or bloated stomach.  Belching.  Chest pain. Different conditions can cause chest pain. Make sure you see your health care provider if you experience chest pain.  Shortness of breath or wheezing.  Ongoing (chronic) cough or a night-time cough.  Wearing away of tooth enamel.  Weight loss. How is this diagnosed? Your health care provider will take a medical history and perform a physical exam. To determine if you have mild or severe GERD, your health care provider may also monitor how you respond to treatment. You may also have tests, including:  A test to examine your stomach and esophagus with a small camera (endoscopy).  A test thatmeasures the acidity level in your esophagus.  A test thatmeasures how much pressure is on your esophagus.  A barium swallow or modified barium swallow test to show the shape, size, and functioning of your esophagus. How is this treated? The goal of treatment is to help relieve your symptoms and to prevent complications. Treatment for this condition may vary depending on how severe your symptoms are. Your health care provider may recommend:  Changes to your diet.  Medicine.  Surgery. Follow these instructions at home: Eating and drinking   Follow a diet as recommended by your health care provider. This may involve avoiding foods and drinks such as: ? Coffee and tea (with or without caffeine). ? Drinks that containalcohol. ? Energy drinks and sports drinks. ? Carbonated drinks or sodas. ? Chocolate and cocoa. ? Peppermint and mint flavorings. ? Garlic and onions. ? Horseradish. ? Spicy and acidic  foods, including peppers, chili powder, curry powder, vinegar, hot sauces, and barbecue sauce. ? Citrus fruit juices and citrus fruits, such as oranges, lemons, and limes. ? Tomato-based foods, such as red sauce, chili, salsa, and pizza with red sauce. ? Fried and fatty foods, such as  donuts, french fries, potato chips, and high-fat dressings. ? High-fat meats, such as hot dogs and fatty cuts of red and white meats, such as rib eye steak, sausage, ham, and bacon. ? High-fat dairy items, such as whole milk, butter, and cream cheese.  Eat small, frequent meals instead of large meals.  Avoid drinking large amounts of liquid with your meals.  Avoid eating meals during the 2-3 hours before bedtime.  Avoid lying down right after you eat.  Do not exercise right after you eat. Lifestyle   Do not use any products that contain nicotine or tobacco, such as cigarettes, e-cigarettes, and chewing tobacco. If you need help quitting, ask your health care provider.  Try to reduce your stress by using methods such as yoga or meditation. If you need help reducing stress, ask your health care provider.  If you are overweight, reduce your weight to an amount that is healthy for you. Ask your health care provider for guidance about a safe weight loss goal. General instructions  Pay attention to any changes in your symptoms.  Take over-the-counter and prescription medicines only as told by your health care provider. Do not take aspirin, ibuprofen, or other NSAIDs unless your health care provider told you to do so.  Wear loose-fitting clothing. Do not wear anything tight around your waist that causes pressure on your abdomen.  Raise (elevate) the head of your bed about 6 inches (15 cm).  Avoid bending over if this makes your symptoms worse.  Keep all follow-up visits as told by your health care provider. This is important. Contact a health care provider if:  You have: ? New symptoms. ? Unexplained weight loss. ? Difficulty swallowing or it hurts to swallow. ? Wheezing or a persistent cough. ? A hoarse voice.  Your symptoms do not improve with treatment. Get help right away if you:  Have pain in your arms, neck, jaw, teeth, or back.  Feel sweaty, dizzy, or  light-headed.  Have chest pain or shortness of breath.  Vomit and your vomit looks like blood or coffee grounds.  Faint.  Have stool that is bloody or black.  Cannot swallow, drink, or eat. Summary  Gastroesophageal reflux happens when acid from the stomach flows up into the esophagus. GERD is a disease in which the reflux happens often, causes frequent or severe symptoms, or causes problems such as damage to the esophagus.  Treatment for this condition may vary depending on how severe your symptoms are. Your health care provider may recommend diet and lifestyle changes, medicine, or surgery.  Contact a health care provider if you have new or worsening symptoms.  Take over-the-counter and prescription medicines only as told by your health care provider. Do not take aspirin, ibuprofen, or other NSAIDs unless your health care provider told you to do so.  Keep all follow-up visits as told by your health care provider. This is important. This information is not intended to replace advice given to you by your health care provider. Make sure you discuss any questions you have with your health care provider. Document Revised: 05/05/2018 Document Reviewed: 05/05/2018 Elsevier Patient Education  2020 [employer]  Food Choices for Gastroesophageal Reflux Disease, Adult When you have  gastroesophageal reflux disease (GERD), the foods you eat and your eating habits are very important. Choosing the right foods can help ease the discomfort of GERD. Consider working with a diet and nutrition specialist (dietitian) to help you make healthy food choices. What general guidelines should I follow?  Eating plan  Choose healthy foods low in fat, such as fruits, vegetables, whole grains, low-fat dairy products, and lean meat, fish, and poultry.  Eat frequent, small meals instead of three large meals each day. Eat your meals slowly, in a relaxed setting. Avoid bending over or lying down until 2-3 hours  after eating.  Limit high-fat foods such as fatty meats or fried foods.  Limit your intake of oils, butter, and shortening to less than 8 teaspoons each day.  Avoid the following: ? Foods that cause symptoms. These may be different for different people. Keep a food diary to keep track of foods that cause symptoms. ? Alcohol. ? Drinking large amounts of liquid with meals. ? Eating meals during the 2-3 hours before bed.  Cook foods using methods other than frying. This may include baking, grilling, or broiling. Lifestyle  Maintain a healthy weight. Ask your health care provider what weight is healthy for you. If you need to lose weight, work with your health care provider to do so safely.  Exercise for at least 30 minutes on 5 or more days each week, or as told by your health care provider.  Avoid wearing clothes that fit tightly around your waist and chest.  Do not use any products that contain nicotine or tobacco, such as cigarettes and e-cigarettes. If you need help quitting, ask your health care provider.  Sleep with the head of your bed raised. Use a wedge under the mattress or blocks under the bed frame to raise the head of the bed. What foods are not recommended? The items listed may not be a complete list. Talk with your dietitian about what dietary choices are best for you. Grains Pastries or quick breads with added fat. [place] toast. Vegetables Deep fried vegetables. [place] fries. Any vegetables prepared with added fat. Any vegetables that cause symptoms. For some people this may include tomatoes and tomato products, chili peppers, onions and garlic, and horseradish. Fruits Any fruits prepared with added fat. Any fruits that cause symptoms. For some people this may include citrus fruits, such as oranges, grapefruit, pineapple, and lemons. Meats and other protein foods High-fat meats, such as fatty beef or pork, hot dogs, ribs, ham, sausage, salami and bacon. Fried meat or  protein, including fried fish and fried chicken. Nuts and nut butters. Dairy Whole milk and chocolate milk. Sour cream. Cream. Ice cream. Cream cheese. Milk shakes. Beverages Coffee and tea, with or without caffeine. Carbonated beverages. Sodas. Energy drinks. Fruit juice made with acidic fruits (such as orange or grapefruit). Tomato juice. Alcoholic drinks. Fats and oils Butter. Margarine. Shortening. Ghee. Sweets and desserts Chocolate and cocoa. Donuts. Seasoning and other foods Pepper. Peppermint and spearmint. Any condiments, herbs, or seasonings that cause symptoms. For some people, this may include curry, hot sauce, or vinegar-based salad dressings. Summary  When you have gastroesophageal reflux disease (GERD), food and lifestyle choices are very important to help ease the discomfort of GERD.  Eat frequent, small meals instead of three large meals each day. Eat your meals slowly, in a relaxed setting. Avoid bending over or lying down until 2-3 hours after eating.  Limit high-fat foods such as fatty meat or fried foods. This  information is not intended to replace advice given to you by your health care provider. Make sure you discuss any questions you have with your health care provider. Document Revised: 02/17/2019 Document Reviewed: 10/28/2016 Elsevier Patient Education  2020 [employer].

## 2020-10-11 NOTE — Progress Notes (Signed)
Chief Complaint  Patient presents with   Heartburn   Gastroesophageal Reflux   Nausea   Acute visit  1 nausea and GERD flare since 10/06/20 and worse 10/10/20 she was on protonix 40 mg qd but when called to get refill from Dr. Primus Bravo the RN stated she could not bee on this qd and rec QOD due to long term side effects 15 months ago she had bariatric surgery and has lost 150 lsb. She had on 10/06/20 worsening nausea, GERD EGD 04/11/2019 +gastritis but bx negative for H yplori and no inflammation  GERD so bad had to sleep in recliner and sit up even though she she waiting 2 hours after eating before going to bed  She called Dr. Marry Guan office today w/o reply since this am so sch appt here  2. [place] 01/24/19 negative GS, + fatty liver -will refer to GI for all of above   Review of Systems  Constitutional: Positive for weight loss.  Gastrointestinal: Positive for heartburn and nausea.   Past Medical History:  Diagnosis Date   Hypertension    during pregnancy   Past Surgical History:  Procedure Laterality Date   SHOULDER SURGERY  03/30/11   TONSILLECTOMY  1984   Family History  Problem Relation Age of Onset   Hypertension Father    Diabetes Other        parent   Breast cancer Maternal Grandmother        75-70   Breast cancer Paternal Grandmother        59-70   Colon cancer Neg Hx    Social History   Socioeconomic History   Marital status: Married    Spouse name: Not on file   Number of children: 1   Years of education: Not on file   Highest education level: Not on file  Occupational History    Employer: [employer]  Tobacco Use   Smoking status: Never Smoker   Smokeless tobacco: Never Used  Building services engineer Use: Never used  Substance and Sexual Activity   Alcohol use: No    Alcohol/week: 0.0 standard drinks   Drug use: No   Sexual activity: Not on file  Other Topics Concern   Not on file  Social History Narrative   Lives in [place];  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    Social Determinants of Health   Financial Resource Strain:    Difficulty of Paying Living Expenses: Not on file  Food Insecurity:    Worried About Programme researcher, broadcasting/film/video in the Last Year: Not on file   [employer] of Food in the Last Year: Not on file  Transportation Needs:    Lack of Transportation (Medical): Not on file   Lack of Transportation (Non-Medical): Not on file  Physical Activity:    Days of Exercise per Week: Not on file   Minutes of Exercise per Session: Not on file  Stress:    Feeling of Stress : Not on file  Social Connections:    Frequency of Communication with Friends and Family: Not on file   Frequency of Social Gatherings with Friends and Family: Not on file   Attends Religious Services: Not on file   Active Member of Clubs or Organizations: Not on file   Attends Banker Meetings: Not on file   Marital Status: Not on file  Intimate Partner Violence:    Fear of Current or Ex-Partner: Not on file   Emotionally Abused:  Not on file   Physically Abused: Not on file   Sexually Abused: Not on file   Current Meds  Medication Sig   Biotin 10 MG CAPS Take by mouth.   Calcium Carbonate (CALCIUM 500 PO) Take by mouth.   [employer] FIBER GUMMIES PO Take by mouth.   Multiple Vitamins-Minerals (ULTRA MULTI FORMULA/IRON) CAPS Take by mouth.   pantoprazole (PROTONIX) 40 MG tablet Take 1 tablet (40 mg total) by mouth at bedtime. 30 minutes before dinner   tranexamic acid (LYSTEDA) 650 MG TABS tablet Take 1,300 mg by mouth 3 (three) times daily.   [DISCONTINUED] pantoprazole (PROTONIX) 40 MG tablet Take 40 mg by mouth every other day.    Allergies  Allergen Reactions   Sulfa Antibiotics    Recent Results (from the past 2160 hour(s))  Hepatic function panel     Status: Abnormal   Collection Time: 07/23/20  3:56 PM  Result Value Ref Range   Total Bilirubin 0.5 0.2 - 1.2 mg/dL   Bilirubin, Direct  0.1 0.0 - 0.3 mg/dL   Alkaline Phosphatase 68 39 - 117 U/L   AST 22 0 - 37 U/L   ALT 41 (H) 0 - 35 U/L   Total Protein 6.6 6.0 - 8.3 g/dL   Albumin 4.3 3.5 - 5.2 g/dL  Iron and TIBC     Status: Abnormal   Collection Time: 08/28/20  2:23 PM  Result Value Ref Range   Iron 52 28 - 170 ug/dL   TIBC 741 (L) 287 - 867 ug/dL   Saturation Ratios 23 10.4 - 31.8 %   UIBC 173 ug/dL    Comment: Performed at [facility], [address] [place], [place] [address]  Ferritin     Status: None   Collection Time: 08/28/20  2:23 PM  Result Value Ref Range   Ferritin 258 11 - 307 ng/mL    Comment: Performed at [facility], [address] [place], [place] [address]  Basic metabolic panel     Status: Abnormal   Collection Time: 08/28/20  2:23 PM  Result Value Ref Range   Sodium 138 135 - 145 mmol/L   Potassium 3.9 3.5 - 5.1 mmol/L   Chloride 105 98 - 111 mmol/L   CO2 27 22 - 32 mmol/L   Glucose, Bld 79 70 - 99 mg/dL    Comment: Glucose reference range applies only to samples taken after fasting for at least 8 hours.   BUN 13 6 - 20 mg/dL   Creatinine, Ser 2.83 0.44 - 1.00 mg/dL   Calcium 8.6 (L) 8.9 - 10.3 mg/dL   GFR, Estimated >66 >29 mL/min   Anion gap 6 5 - 15    Comment: Performed at [facility], [address] [place], [place] [address]  CBC     Status: None   Collection Time: 08/28/20  2:23 PM  Result Value Ref Range   WBC 5.4 4.0 - 10.5 K/uL   RBC 4.04 3.87 - 5.11 MIL/uL   Hemoglobin 13.0 12.0 - 15.0 g/dL   HCT 65.0 36 - 46 %   MCV 92.8 80.0 - 100.0 fL   MCH 32.2 26.0 - 34.0 pg   MCHC 34.7 30.0 - 36.0 g/dL   RDW 35.4 65.6 - 81.2 %   Platelets 172 150 - 400 K/uL   nRBC 0.0 0.0 - 0.2 %    Comment: Performed at [facility], [address], [place], [place] [address]   Objective  Body mass index is 30.87 kg/m. Wt Readings from Last 3 Encounters:  10/11/20 203 lb (92.1 kg)  08/28/20 207 lb (93.9 kg)  07/18/20 215 lb (97.5 kg)   Temp  Readings from Last 3 Encounters:  10/11/20 98.2 F (36.8 C) (Oral)  08/28/20 (!) 97.5 F (36.4 C) (Tympanic)  08/08/20 (!) 96.6 F (35.9 C) (Tympanic)   BP Readings from Last 3 Encounters:  10/11/20 110/72  08/28/20 111/80  08/08/20 132/85   Pulse Readings from Last 3 Encounters:  10/11/20 83  08/28/20 67  08/08/20 77    Physical Exam Vitals and nursing note reviewed.  Constitutional:      Appearance: Normal appearance. She is well-developed and well-groomed. She is obese.  HENT:     Head: Normocephalic and atraumatic.  Cardiovascular:     Rate and Rhythm: Normal rate and regular rhythm.     Heart sounds: Normal heart sounds. No murmur heard.   Pulmonary:     Effort: Pulmonary effort is normal.     Breath sounds: Normal breath sounds.  Abdominal:     General: Abdomen is flat. Bowel sounds are normal.     Tenderness: There is no abdominal tenderness.  Skin:    General: Skin is warm and dry.  Neurological:     General: No focal deficit present.     Mental Status: She is alert and oriented to person, place, and time. Mental status is at baseline.     Gait: Gait normal.  Psychiatric:        Attention and Perception: Attention and perception normal.        Mood and Affect: Mood and affect normal.        Speech: Speech normal.        Behavior: Behavior normal. Behavior is cooperative.        Thought Content: Thought content normal.        Cognition and Memory: Cognition and memory normal.        Judgment: Judgment normal.     Assessment  Plan  Gastroesophageal reflux disease without esophagitis - Plan: pantoprazole (PROTONIX) 40 MG tablet qhs, pepcid otc in the am, Ambulatory referral to Gastroenterology Dr. Servando Snare  Fatty liver - Plan: Ambulatory referral to Gastroenterology  Consider hep A/B testing in future   Provider: Dr. French Ana McLean-Scocuzza-Internal Medicine

## 2020-10-15 DIAGNOSIS — R1013 Epigastric pain: Secondary | ICD-10-CM | POA: Diagnosis not present

## 2020-10-15 DIAGNOSIS — K8020 Calculus of gallbladder without cholecystitis without obstruction: Secondary | ICD-10-CM | POA: Diagnosis not present

## 2020-10-16 DIAGNOSIS — R1013 Epigastric pain: Secondary | ICD-10-CM | POA: Diagnosis not present

## 2020-10-16 DIAGNOSIS — K828 Other specified diseases of gallbladder: Secondary | ICD-10-CM | POA: Diagnosis not present

## 2020-10-16 DIAGNOSIS — K219 Gastro-esophageal reflux disease without esophagitis: Secondary | ICD-10-CM | POA: Diagnosis not present

## 2020-10-16 DIAGNOSIS — Z9884 Bariatric surgery status: Secondary | ICD-10-CM | POA: Diagnosis not present

## 2020-10-26 DIAGNOSIS — I10 Essential (primary) hypertension: Secondary | ICD-10-CM | POA: Diagnosis not present

## 2020-10-26 DIAGNOSIS — K8010 Calculus of gallbladder with chronic cholecystitis without obstruction: Secondary | ICD-10-CM | POA: Diagnosis not present

## 2020-10-26 DIAGNOSIS — K838 Other specified diseases of biliary tract: Secondary | ICD-10-CM | POA: Diagnosis not present

## 2020-10-26 DIAGNOSIS — K839 Disease of biliary tract, unspecified: Secondary | ICD-10-CM | POA: Diagnosis not present

## 2020-10-26 DIAGNOSIS — Z9884 Bariatric surgery status: Secondary | ICD-10-CM | POA: Diagnosis not present

## 2020-10-26 DIAGNOSIS — K449 Diaphragmatic hernia without obstruction or gangrene: Secondary | ICD-10-CM | POA: Diagnosis not present

## 2020-10-26 DIAGNOSIS — Z79899 Other long term (current) drug therapy: Secondary | ICD-10-CM | POA: Diagnosis not present

## 2020-10-26 DIAGNOSIS — R1013 Epigastric pain: Secondary | ICD-10-CM | POA: Diagnosis not present

## 2020-10-26 DIAGNOSIS — D509 Iron deficiency anemia, unspecified: Secondary | ICD-10-CM | POA: Diagnosis not present

## 2020-10-26 DIAGNOSIS — G4733 Obstructive sleep apnea (adult) (pediatric): Secondary | ICD-10-CM | POA: Diagnosis not present

## 2020-10-26 DIAGNOSIS — Z882 Allergy status to sulfonamides status: Secondary | ICD-10-CM | POA: Diagnosis not present

## 2020-10-31 DIAGNOSIS — Z9884 Bariatric surgery status: Secondary | ICD-10-CM | POA: Diagnosis not present

## 2020-10-31 DIAGNOSIS — Z713 Dietary counseling and surveillance: Secondary | ICD-10-CM | POA: Diagnosis not present

## 2020-10-31 DIAGNOSIS — E663 Overweight: Secondary | ICD-10-CM | POA: Diagnosis not present

## 2020-11-13 DIAGNOSIS — Z09 Encounter for follow-up examination after completed treatment for conditions other than malignant neoplasm: Secondary | ICD-10-CM | POA: Insufficient documentation

## 2020-11-15 ENCOUNTER — Ambulatory Visit (INDEPENDENT_AMBULATORY_CARE_PROVIDER_SITE_OTHER): Payer: BC Managed Care – PPO

## 2020-11-15 ENCOUNTER — Other Ambulatory Visit: Payer: Self-pay

## 2020-11-15 ENCOUNTER — Ambulatory Visit (INDEPENDENT_AMBULATORY_CARE_PROVIDER_SITE_OTHER): Payer: BC Managed Care – PPO | Admitting: Internal Medicine

## 2020-11-15 VITALS — BP 110/70 | HR 84 | Temp 97.8°F | Resp 16 | Ht 68.0 in | Wt 199.0 lb

## 2020-11-15 DIAGNOSIS — M533 Sacrococcygeal disorders, not elsewhere classified: Secondary | ICD-10-CM | POA: Diagnosis not present

## 2020-11-15 DIAGNOSIS — E611 Iron deficiency: Secondary | ICD-10-CM

## 2020-11-15 DIAGNOSIS — F439 Reaction to severe stress, unspecified: Secondary | ICD-10-CM

## 2020-11-15 DIAGNOSIS — N926 Irregular menstruation, unspecified: Secondary | ICD-10-CM | POA: Diagnosis not present

## 2020-11-15 DIAGNOSIS — K760 Fatty (change of) liver, not elsewhere classified: Secondary | ICD-10-CM

## 2020-11-15 DIAGNOSIS — Z9884 Bariatric surgery status: Secondary | ICD-10-CM

## 2020-11-15 DIAGNOSIS — D509 Iron deficiency anemia, unspecified: Secondary | ICD-10-CM

## 2020-11-15 DIAGNOSIS — I10 Essential (primary) hypertension: Secondary | ICD-10-CM

## 2020-11-15 DIAGNOSIS — R1310 Dysphagia, unspecified: Secondary | ICD-10-CM

## 2020-11-15 DIAGNOSIS — K219 Gastro-esophageal reflux disease without esophagitis: Secondary | ICD-10-CM

## 2020-11-15 LAB — CBC WITH DIFFERENTIAL/PLATELET
Basophils Absolute: 0 10*3/uL (ref 0.0–0.1)
Basophils Relative: 0.8 % (ref 0.0–3.0)
Eosinophils Absolute: 0.2 10*3/uL (ref 0.0–0.7)
Eosinophils Relative: 3.7 % (ref 0.0–5.0)
HCT: 39.8 % (ref 36.0–46.0)
Hemoglobin: 13.4 g/dL (ref 12.0–15.0)
Lymphocytes Relative: 30.7 % (ref 12.0–46.0)
Lymphs Abs: 1.5 10*3/uL (ref 0.7–4.0)
MCHC: 33.7 g/dL (ref 30.0–36.0)
MCV: 93.2 fl (ref 78.0–100.0)
Monocytes Absolute: 0.4 10*3/uL (ref 0.1–1.0)
Monocytes Relative: 7.4 % (ref 3.0–12.0)
Neutro Abs: 2.8 10*3/uL (ref 1.4–7.7)
Neutrophils Relative %: 57.4 % (ref 43.0–77.0)
Platelets: 208 10*3/uL (ref 150.0–400.0)
RBC: 4.27 Mil/uL (ref 3.87–5.11)
RDW: 12.4 % (ref 11.5–15.5)
WBC: 4.9 10*3/uL (ref 4.0–10.5)

## 2020-11-15 LAB — BASIC METABOLIC PANEL
BUN: 13 mg/dL (ref 6–23)
CO2: 31 mEq/L (ref 19–32)
Calcium: 9.3 mg/dL (ref 8.4–10.5)
Chloride: 105 mEq/L (ref 96–112)
Creatinine, Ser: 0.75 mg/dL (ref 0.40–1.20)
GFR: 98.1 mL/min (ref >60.00)
Glucose, Bld: 83 mg/dL (ref 70–99)
Potassium: 4.5 mEq/L (ref 3.5–5.1)
Sodium: 140 mEq/L (ref 135–145)

## 2020-11-15 LAB — IBC + FERRITIN
Ferritin: 144.5 ng/mL (ref 10.0–291.0)
Iron: 94 ug/dL (ref 42–145)
Saturation Ratios: 33.7 % (ref 20.0–50.0)
Transferrin: 199 mg/dL — ABNORMAL LOW (ref 212.0–360.0)

## 2020-11-15 LAB — TSH: TSH: 1.34 u[IU]/mL (ref 0.35–4.50)

## 2020-11-15 LAB — POCT URINE PREGNANCY: Preg Test, Ur: NEGATIVE

## 2020-11-15 NOTE — Progress Notes (Signed)
Patient ID: Erica Hernandez, female   DOB: 1976/06/13, 45 y.o.   MRN: [id number]   Subjective:    Patient ID: Erica Hernandez, female    DOB: 22-Feb-1976, 45 y.o.   MRN: [id number]  HPI This visit occurred during the SARS-CoV-2 public health emergency.  Safety protocols were in place, including screening questions prior to the visit, additional usage of staff PPE, and extensive cleaning of exam room while observing appropriate contact time as indicated for disinfecting solutions.  Patient here for a scheduled follow up. Here to follow up regarding her anemia, blood pressure and recent gallbladder surgery.  S/p cholecystectomy two weeks ago.  Saw Dr Smitty Cords 11/13/20 (virtual visit).  Reported still noticing some minimal dysphagia - describes "food getting stuck". She discussed with surgeon.  Told tissue could still be a little swollen.  She is monitoring.  No chest pain.  No sob.  Breathing stable.  No abdominal pain.  Bowels moving.  She does report increased pain - tailbone.  Hurts when she sits on something hard or firm.  No known injury or trauma.  Present for weeks now.  Does not limit walking or activity.  Periods are better.  Seeing gyn.   Taking lysteda. hgb improved.     Past Medical History:  Diagnosis Date   Hypertension    during pregnancy   Past Surgical History:  Procedure Laterality Date   SHOULDER SURGERY  03/30/11   TONSILLECTOMY  1984   Family History  Problem Relation Age of Onset   Hypertension Father    Diabetes Other        parent   Breast cancer Maternal Grandmother        37-70   Breast cancer Paternal Grandmother        45-70   Colon cancer Neg Hx    Social History   Socioeconomic History   Marital status: Married    Spouse name: Not on file   Number of children: 1   Years of education: Not on file   Highest education level: Not on file  Occupational History    Employer: [employer]  Tobacco Use   Smoking status: Never Smoker   Smokeless  tobacco: Never Used  Building services engineer Use: Never used  Substance and Sexual Activity   Alcohol use: No    Alcohol/week: 0.0 standard drinks   Drug use: No   Sexual activity: Not on file  Other Topics Concern   Not on file  Social History Narrative   Lives in [place]; 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    Social Determinants of Health   Financial Resource Strain: Not on file  Food Insecurity: Not on file  Transportation Needs: Not on file  Physical Activity: Not on file  Stress: Not on file  Social Connections: Not on file    Outpatient Encounter Medications as of 11/15/2020  Medication Sig   Biotin 10 MG CAPS Take by mouth.   Calcium Carbonate (CALCIUM 500 PO) Take by mouth.   [employer] FIBER GUMMIES PO Take by mouth.   Multiple Vitamins-Minerals (ULTRA MULTI FORMULA/IRON) CAPS Take by mouth.   pantoprazole (PROTONIX) 40 MG tablet Take 1 tablet (40 mg total) by mouth at bedtime. 30 minutes before dinner   tranexamic acid (LYSTEDA) 650 MG TABS tablet Take 1,300 mg by mouth 3 (three) times daily.   [DISCONTINUED] norethindrone (MICRONOR) 0.35 MG tablet Take 1 tablet (0.35 mg total) by mouth daily.   No facility-administered encounter medications  on file as of 11/15/2020.    Review of Systems  Constitutional: Negative for appetite change and unexpected weight change.  HENT: Negative for congestion.   Respiratory: Negative for cough, chest tightness and shortness of breath.   Cardiovascular: Negative for chest pain, palpitations and leg swelling.  Gastrointestinal: Negative for abdominal pain, diarrhea, nausea and vomiting.       Minimal dysphagia as outlined.    Genitourinary: Negative for difficulty urinating and dysuria.  Musculoskeletal: Negative for joint swelling and myalgias.  Skin: Negative for color change and rash.  Neurological: Negative for dizziness, light-headedness and headaches.  Psychiatric/Behavioral: Negative for agitation and dysphoric  mood.       Objective:    Physical Exam Vitals reviewed.  Constitutional:      General: She is not in acute distress.    Appearance: Normal appearance.  HENT:     Head: Normocephalic and atraumatic.     Right Ear: External ear normal.     Left Ear: External ear normal.     Mouth/Throat:     Mouth: Oropharynx is clear and moist.  Eyes:     General: No scleral icterus.       Right eye: No discharge.        Left eye: No discharge.     Conjunctiva/sclera: Conjunctivae normal.  Neck:     Thyroid: No thyromegaly.  Cardiovascular:     Rate and Rhythm: Normal rate and regular rhythm.  Pulmonary:     Effort: No respiratory distress.     Breath sounds: Normal breath sounds. No wheezing.  Abdominal:     General: Bowel sounds are normal.     Palpations: Abdomen is soft.     Tenderness: There is no abdominal tenderness.  Musculoskeletal:        General: No swelling, tenderness or edema.     Cervical back: Neck supple. No tenderness.     Comments: Pain to palpation - lower sacrum/coccyx.   Lymphadenopathy:     Cervical: No cervical adenopathy.  Skin:    Findings: No erythema or rash.  Neurological:     Mental Status: She is alert.  Psychiatric:        Mood and Affect: Mood normal.        Behavior: Behavior normal.     BP 110/70    Pulse 84    Temp 97.8 F (36.6 C) (Oral)    Resp 16    Ht 5\' 8"  (1.727 m)    Wt 199 lb (90.3 kg)    SpO2 99%    BMI 30.26 kg/m  Wt Readings from Last 3 Encounters:  11/15/20 199 lb (90.3 kg)  10/11/20 203 lb (92.1 kg)  08/28/20 207 lb (93.9 kg)     Lab Results  Component Value Date   WBC 4.9 11/15/2020   HGB 13.4 11/15/2020   HCT 39.8 11/15/2020   PLT 208.0 11/15/2020   GLUCOSE 83 11/15/2020   CHOL 140 06/28/2020   TRIG 81.0 06/28/2020   HDL 46.00 06/28/2020   LDLCALC 78 06/28/2020   ALT 41 (H) 07/23/2020   AST 22 07/23/2020   NA 140 11/15/2020   K 4.5 11/15/2020   CL 105 11/15/2020   CREATININE 0.75 11/15/2020   BUN 13  11/15/2020   CO2 31 11/15/2020   TSH 1.34 11/15/2020    01/13/2021 BREAST LTD UNI RIGHT INC AXILLA  Result Date: 07/26/2020 CLINICAL DATA:  Screening recall for right breast asymmetry. The patient has had a greater  than 130 pound weight loss in the past year. EXAM: DIGITAL DIAGNOSTIC UNILATERAL RIGHT MAMMOGRAM WITH TOMO AND CAD; ULTRASOUND RIGHT BREAST LIMITED COMPARISON:  Previous exams. ACR Breast Density Category b: There are scattered areas of fibroglandular density. FINDINGS: Spot compression tomograms were performed of the right breast. The initially questioned possible right breast asymmetry spreads out and demonstrates imaging features suggestive of an [place] of normal fibroglandular tissue, overall similar in appearance when compared to prior mammograms from 2015. This asymmetry is felt to be more prominent due to patient's interval significant weight loss. Mammographic images were processed with CAD. Targeted ultrasound of the upper-outer quadrant of the right breast was performed. No suspicious masses or abnormality seen, only normal-appearing fibroglandular tissue. IMPRESSION: No findings of malignancy in the right breast. RECOMMENDATION: Screening mammogram in one year.(Code:SM-B-01Y) I have discussed the findings and recommendations with the patient. If applicable, a reminder letter will be sent to the patient regarding the next appointment. BI-RADS CATEGORY  1: Negative. Electronically Signed   By: Edwin Cap M.D.   On: 07/26/2020 11:42   MM DIAG BREAST TOMO UNI RIGHT  Result Date: 07/26/2020 CLINICAL DATA:  Screening recall for right breast asymmetry. The patient has had a greater than 130 pound weight loss in the past year. EXAM: DIGITAL DIAGNOSTIC UNILATERAL RIGHT MAMMOGRAM WITH TOMO AND CAD; ULTRASOUND RIGHT BREAST LIMITED COMPARISON:  Previous exams. ACR Breast Density Category b: There are scattered areas of fibroglandular density. FINDINGS: Spot compression tomograms were performed of  the right breast. The initially questioned possible right breast asymmetry spreads out and demonstrates imaging features suggestive of an [place] of normal fibroglandular tissue, overall similar in appearance when compared to prior mammograms from 2015. This asymmetry is felt to be more prominent due to patient's interval significant weight loss. Mammographic images were processed with CAD. Targeted ultrasound of the upper-outer quadrant of the right breast was performed. No suspicious masses or abnormality seen, only normal-appearing fibroglandular tissue. IMPRESSION: No findings of malignancy in the right breast. RECOMMENDATION: Screening mammogram in one year.(Code:SM-B-01Y) I have discussed the findings and recommendations with the patient. If applicable, a reminder letter will be sent to the patient regarding the next appointment. BI-RADS CATEGORY  1: Negative. Electronically Signed   By: Edwin Cap M.D.   On: 07/26/2020 11:42       Assessment & Plan:   Problem List Items Addressed This Visit    Stress    Overall handling things well.  Follow.        History of bariatric surgery    S/p bariatric surgery.  Recheck cbc today.  Has lost a significant amount of weight.  Continue f/u with Dr Smitty Cords.        Anemia - Primary    History of bariatric surgery.  Seeing hematology.  Has received IV iron.  Recheck cbc and iron studies today.       Relevant Orders   CBC with Differential/Platelet (Completed)   TSH (Completed)   Basic metabolic panel (Completed)   IBC + Ferritin (Completed)   Menstrual irregularity    Seeing gyn.  Periods doing better on Lysteda.  Check cbc.       Relevant Orders   POCT urine pregnancy (Completed)   Iron deficiency    Is s/p bariatric surgery and heavy menstrual cycles.  Periods better on Lysteda.  Seeing hematology.  Has required iron infusion.  Last hgb 13.  Recheck cbc and iron studies today.        Gastroesophageal  reflux disease without esophagitis     No acid reflux reported.  Minimal dysphagia as outlined.  She was just evaluated by Dr Darnell Level.  Feel related to inflammation.  Follow closely.        Fatty liver    Has lost weight.  Continue diet and exercise.  Check liver panel.       Tail bone pain    Persistent pain as outlined.  No trauma or injury. Given persistent pain, check xray.  Further w/up pending results.  Discussed cushion.        Relevant Orders   DG Sacrum/Coccyx (Completed)   Dysphagia    Mild dysphagia as outlined.  She is s/p recent cholecystectomy and hiatal hernia repair.  Just saw surgery. Felt related to inflammation.  Did not recommend any further intervention at this time.  Follow closely.  Notify if persistent symptoms.            Einar Pheasant, MD

## 2020-11-17 ENCOUNTER — Encounter: Payer: Self-pay | Admitting: Internal Medicine

## 2020-11-17 DIAGNOSIS — M533 Sacrococcygeal disorders, not elsewhere classified: Secondary | ICD-10-CM | POA: Insufficient documentation

## 2020-11-17 DIAGNOSIS — R1310 Dysphagia, unspecified: Secondary | ICD-10-CM | POA: Insufficient documentation

## 2020-11-17 NOTE — Assessment & Plan Note (Signed)
Has lost weight.  Continue diet and exercise.  Check liver panel.

## 2020-11-17 NOTE — Assessment & Plan Note (Signed)
No acid reflux reported.  Minimal dysphagia as outlined.  She was just evaluated by Dr Smitty Cords.  Feel related to inflammation.  Follow closely.

## 2020-11-17 NOTE — Assessment & Plan Note (Signed)
Overall handling things well.  Follow.   

## 2020-11-17 NOTE — Assessment & Plan Note (Signed)
Persistent pain as outlined.  No trauma or injury. Given persistent pain, check xray.  Further w/up pending results.  Discussed cushion.

## 2020-11-17 NOTE — Assessment & Plan Note (Signed)
History of bariatric surgery.  Seeing hematology.  Has received IV iron.  Recheck cbc and iron studies today.

## 2020-11-17 NOTE — Assessment & Plan Note (Signed)
Is s/p bariatric surgery and heavy menstrual cycles.  Periods better on Lysteda.  Seeing hematology.  Has required iron infusion.  Last hgb 13.  Recheck cbc and iron studies today.

## 2020-11-17 NOTE — Assessment & Plan Note (Signed)
Seeing gyn.  Periods doing better on Lysteda.  Check cbc.

## 2020-11-17 NOTE — Assessment & Plan Note (Signed)
Mild dysphagia as outlined.  She is s/p recent cholecystectomy and hiatal hernia repair.  Just saw surgery. Felt related to inflammation.  Did not recommend any further intervention at this time.  Follow closely.  Notify if persistent symptoms.

## 2020-11-17 NOTE — Assessment & Plan Note (Signed)
S/p bariatric surgery.  Recheck cbc today.  Has lost a significant amount of weight.  Continue f/u with Dr Smitty Cords.

## 2020-11-22 ENCOUNTER — Telehealth: Payer: Self-pay | Admitting: Internal Medicine

## 2020-11-22 NOTE — Telephone Encounter (Signed)
11/22/2020 Called pt to inform her that her upcoming appts have been moved out per Dr. B due to increase in covid cases. New appts on 01/01/21, pt confirmed these changes  srw

## 2020-11-23 DIAGNOSIS — Z20822 Contact with and (suspected) exposure to covid-19: Secondary | ICD-10-CM | POA: Diagnosis not present

## 2020-11-28 ENCOUNTER — Ambulatory Visit: Payer: BC Managed Care – PPO | Admitting: Internal Medicine

## 2020-11-28 ENCOUNTER — Ambulatory Visit: Payer: BC Managed Care – PPO

## 2020-11-28 ENCOUNTER — Other Ambulatory Visit: Payer: BC Managed Care – PPO

## 2020-12-03 ENCOUNTER — Ambulatory Visit: Payer: BC Managed Care – PPO | Admitting: Gastroenterology

## 2020-12-05 DIAGNOSIS — Z9884 Bariatric surgery status: Secondary | ICD-10-CM | POA: Diagnosis not present

## 2020-12-05 DIAGNOSIS — R1319 Other dysphagia: Secondary | ICD-10-CM | POA: Diagnosis not present

## 2020-12-14 DIAGNOSIS — Z79899 Other long term (current) drug therapy: Secondary | ICD-10-CM | POA: Diagnosis not present

## 2020-12-14 DIAGNOSIS — R1310 Dysphagia, unspecified: Secondary | ICD-10-CM | POA: Diagnosis not present

## 2020-12-14 DIAGNOSIS — G4733 Obstructive sleep apnea (adult) (pediatric): Secondary | ICD-10-CM | POA: Diagnosis not present

## 2020-12-14 DIAGNOSIS — K449 Diaphragmatic hernia without obstruction or gangrene: Secondary | ICD-10-CM | POA: Diagnosis not present

## 2020-12-14 DIAGNOSIS — Z882 Allergy status to sulfonamides status: Secondary | ICD-10-CM | POA: Diagnosis not present

## 2020-12-31 DIAGNOSIS — K449 Diaphragmatic hernia without obstruction or gangrene: Secondary | ICD-10-CM | POA: Diagnosis not present

## 2021-01-01 ENCOUNTER — Other Ambulatory Visit: Payer: Self-pay

## 2021-01-01 ENCOUNTER — Inpatient Hospital Stay: Payer: BC Managed Care – PPO | Attending: Internal Medicine

## 2021-01-01 ENCOUNTER — Inpatient Hospital Stay: Payer: BC Managed Care – PPO

## 2021-01-01 ENCOUNTER — Encounter: Payer: Self-pay | Admitting: Internal Medicine

## 2021-01-01 ENCOUNTER — Inpatient Hospital Stay (HOSPITAL_BASED_OUTPATIENT_CLINIC_OR_DEPARTMENT_OTHER): Payer: BC Managed Care – PPO | Admitting: Internal Medicine

## 2021-01-01 DIAGNOSIS — E611 Iron deficiency: Secondary | ICD-10-CM

## 2021-01-01 DIAGNOSIS — Z803 Family history of malignant neoplasm of breast: Secondary | ICD-10-CM | POA: Diagnosis not present

## 2021-01-01 DIAGNOSIS — Z9884 Bariatric surgery status: Secondary | ICD-10-CM | POA: Insufficient documentation

## 2021-01-01 DIAGNOSIS — Z20822 Contact with and (suspected) exposure to covid-19: Secondary | ICD-10-CM | POA: Diagnosis not present

## 2021-01-01 DIAGNOSIS — K449 Diaphragmatic hernia without obstruction or gangrene: Secondary | ICD-10-CM | POA: Insufficient documentation

## 2021-01-01 DIAGNOSIS — D508 Other iron deficiency anemias: Secondary | ICD-10-CM | POA: Insufficient documentation

## 2021-01-01 DIAGNOSIS — K909 Intestinal malabsorption, unspecified: Secondary | ICD-10-CM | POA: Insufficient documentation

## 2021-01-01 LAB — CBC WITH DIFFERENTIAL/PLATELET
Abs Immature Granulocytes: 0.02 10*3/uL (ref 0.00–0.07)
Basophils Absolute: 0 10*3/uL (ref 0.0–0.1)
Basophils Relative: 0 %
Eosinophils Absolute: 0.1 10*3/uL (ref 0.0–0.5)
Eosinophils Relative: 2 %
HCT: 39.9 % (ref 36.0–46.0)
Hemoglobin: 13.8 g/dL (ref 12.0–15.0)
Immature Granulocytes: 0 %
Lymphocytes Relative: 32 %
Lymphs Abs: 1.8 10*3/uL (ref 0.7–4.0)
MCH: 32.3 pg (ref 26.0–34.0)
MCHC: 34.6 g/dL (ref 30.0–36.0)
MCV: 93.4 fL (ref 80.0–100.0)
Monocytes Absolute: 0.4 10*3/uL (ref 0.1–1.0)
Monocytes Relative: 7 %
Neutro Abs: 3.2 10*3/uL (ref 1.7–7.7)
Neutrophils Relative %: 59 %
Platelets: 161 10*3/uL (ref 150–400)
RBC: 4.27 MIL/uL (ref 3.87–5.11)
RDW: 11.9 % (ref 11.5–15.5)
WBC: 5.6 10*3/uL (ref 4.0–10.5)
nRBC: 0 % (ref 0.0–0.2)

## 2021-01-01 LAB — FERRITIN: Ferritin: 141 ng/mL (ref 11–307)

## 2021-01-01 LAB — IRON AND TIBC
Iron: 46 ug/dL (ref 28–170)
Saturation Ratios: 16 % (ref 10.4–31.8)
TIBC: 280 ug/dL (ref 250–450)
UIBC: 234 ug/dL

## 2021-01-01 NOTE — Progress Notes (Signed)
Went in for gallbladder removal on 10/26/20 and a hiatal hernia was found. She is having surgery on Friday for the hernia.

## 2021-01-01 NOTE — Assessment & Plan Note (Addendum)
#  Iron deficiency anemia-multifactorial/malabsorption-bariatric surgery; heavy menstrual cycles. S/p IV iron. Today hemoglobin is 13. 8; iron studies a month ago saturation 33%.  Unstressed rec pending.  #Hold IV iron at this time.    *hiat sx # DISPOSITION: # No infusion today # follow up in 4 months- MD; labs- cbc/bmp;iron studies/ferritin; B12; possible ferrahem Dr.B

## 2021-01-01 NOTE — Progress Notes (Signed)
[facility] CONSULT NOTE  Patient Care Team: [place], MD as PCP - General (Internal Medicine)  CHIEF COMPLAINTS/PURPOSE OF CONSULTATION:    HEMATOLOGY HISTORY  # IRON DEF ANEMIA -malabsorption /heavy menstrual cycles. 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  female with iron deficiency sec to malabsorption is here for follow-up.  Patient had gallbladder surgery recently.  She is awaiting to have a hiatal hernia surgery.  She is on "liver detox diet".  Denies any worsening fatigue.  No blood in stools or black or stools.  Review of Systems  Constitutional: Positive for malaise/fatigue and weight loss (Intentional/gastric bypass). Negative for chills, diaphoresis and fever.  HENT: Negative for nosebleeds and sore throat.   Eyes: Negative for double vision.  Respiratory: Negative for cough, hemoptysis, sputum production, shortness of breath and wheezing.   Cardiovascular: Negative for chest pain, palpitations, orthopnea and leg swelling.  Gastrointestinal: Negative for abdominal pain, blood in stool, constipation, diarrhea, heartburn, melena, nausea and vomiting.  Genitourinary: Negative for dysuria, frequency and urgency.  Musculoskeletal: Negative for back pain and joint pain.  Skin: Negative.  Negative for itching and rash.  Neurological: Negative for dizziness, tingling, focal weakness, weakness and headaches.  Endo/Heme/Allergies: Does not bruise/bleed easily.  Psychiatric/Behavioral: Negative for depression. The patient is not nervous/anxious and does not have insomnia.     MEDICAL HISTORY:  Past Medical History:  Diagnosis Date  . Hypertension    during pregnancy    SURGICAL HISTORY: Past Surgical History:  Procedure Laterality Date   . SHOULDER SURGERY  03/30/11  . TONSILLECTOMY  1984    SOCIAL HISTORY: Social History   Socioeconomic History  . Marital status: Married    Spouse name: Not on file  . Number of children: 1  . Years of education: Not on file  . Highest education level: Not on file  Occupational History    Employer: [employer]  Tobacco Use  . Smoking status: Never Smoker  . Smokeless tobacco: Never Used  Vaping Use  . Vaping Use: Never used  Substance and Sexual Activity  . Alcohol use: No    Alcohol/week: 0.0 standard drinks  . Drug use: No  . Sexual activity: Not on file  Other Topics Concern  . Not on file  Social History Narrative   Lives in [place]; 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    Social Determinants of Health   Financial Resource Strain: Not on file  Food Insecurity: Not on file  Transportation Needs: Not on file  Physical Activity: Not on file  Stress: Not on file  Social Connections: Not on file  Intimate Partner Violence: Not on file    FAMILY HISTORY: Family History  Problem Relation Age of Onset  . Hypertension Father   . Diabetes Other        parent  . Breast cancer Maternal Grandmother        60-70  . Breast cancer Paternal Grandmother        23-70  . Colon cancer Neg Hx     ALLERGIES:  is allergic to sulfa antibiotics.  MEDICATIONS:  Current Outpatient Medications  Medication Sig Dispense Refill  . Biotin 10 MG CAPS Take by mouth.    . [employer] FIBER GUMMIES PO Take by  mouth.    . pantoprazole (PROTONIX) 40 MG tablet Take 1 tablet (40 mg total) by mouth at bedtime. 30 minutes before dinner 90 tablet 3  . tranexamic acid (LYSTEDA) 650 MG TABS tablet Take 1,300 mg by mouth 3 (three) times daily.    . Calcium Carbonate (CALCIUM 500 PO) Take by mouth. (Patient not taking: Reported on 01/01/2021)     No current facility-administered medications for this visit.      PHYSICAL EXAMINATION:   Vitals:   01/01/21 1302  BP: 106/71  Pulse: 69   Resp: 16  Temp: 97.6 F (36.4 C)  SpO2: 100%   Filed Weights   01/01/21 1302  Weight: 193 lb (87.5 kg)    Physical Exam HENT:     Head: Normocephalic and atraumatic.     Mouth/Throat:     Pharynx: No oropharyngeal exudate.  Eyes:     Pupils: Pupils are equal, round, and reactive to light.  Cardiovascular:     Rate and Rhythm: Normal rate and regular rhythm.  Pulmonary:     Effort: Pulmonary effort is normal. No respiratory distress.     Breath sounds: Normal breath sounds. No wheezing.  Abdominal:     General: Bowel sounds are normal. There is no distension.     Palpations: Abdomen is soft. There is no mass.     Tenderness: There is no abdominal tenderness. There is no guarding or rebound.  Musculoskeletal:        General: No tenderness. Normal range of motion.     Cervical back: Normal range of motion and neck supple.  Skin:    General: Skin is warm.  Neurological:     Mental Status: She is alert and oriented to person, place, and time.  Psychiatric:        Mood and Affect: Affect normal.     LABORATORY DATA:  I have reviewed the data as listed Lab Results  Component Value Date   WBC 5.6 01/01/2021   HGB 13.8 01/01/2021   HCT 39.9 01/01/2021   MCV 93.4 01/01/2021   PLT 161 01/01/2021   Recent Labs    06/28/20 1057 07/23/20 1556 08/28/20 1423 11/15/20 0911  NA 138  --  138 140  K 4.2  --  3.9 4.5  CL 106  --  105 105  CO2 27  --  27 31  GLUCOSE 86  --  79 83  BUN 15  --  13 13  CREATININE 0.79  --  0.74 0.75  CALCIUM 9.1  --  8.6* 9.3  GFRNONAA  --   --  >60  --   PROT 6.8 6.6  --   --   ALBUMIN 4.3 4.3  --   --   AST 23 22  --   --   ALT 51* 41*  --   --   ALKPHOS 76 68  --   --   BILITOT 0.4 0.5  --   --   BILIDIR 0.1 0.1  --   --      No results found.  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 S/p IV iron. Today hemoglobin is 13. 8; iron studies a month ago  saturation 33%.  Unstressed rec pending.  #Hold IV iron at this time.    *hiat sx # DISPOSITION: # No infusion today # follow up in 4 months- MD; labs- cbc/bmp;iron studies/ferritin; B12; possible ferrahem Dr.B   All questions were answered. The patient knows to  call the clinic with any problems, questions or concerns.   Earna Coder, MD 01/01/2021 2:26 PM

## 2021-01-04 DIAGNOSIS — Z9884 Bariatric surgery status: Secondary | ICD-10-CM | POA: Diagnosis not present

## 2021-01-04 DIAGNOSIS — D508 Other iron deficiency anemias: Secondary | ICD-10-CM | POA: Diagnosis not present

## 2021-01-04 DIAGNOSIS — K449 Diaphragmatic hernia without obstruction or gangrene: Secondary | ICD-10-CM | POA: Diagnosis not present

## 2021-01-04 DIAGNOSIS — Z6829 Body mass index (BMI) 29.0-29.9, adult: Secondary | ICD-10-CM | POA: Insufficient documentation

## 2021-01-04 DIAGNOSIS — I10 Essential (primary) hypertension: Secondary | ICD-10-CM | POA: Diagnosis not present

## 2021-01-04 DIAGNOSIS — Z79899 Other long term (current) drug therapy: Secondary | ICD-10-CM | POA: Diagnosis not present

## 2021-01-04 DIAGNOSIS — G4733 Obstructive sleep apnea (adult) (pediatric): Secondary | ICD-10-CM | POA: Diagnosis not present

## 2021-01-04 DIAGNOSIS — D509 Iron deficiency anemia, unspecified: Secondary | ICD-10-CM | POA: Diagnosis not present

## 2021-01-05 DIAGNOSIS — Z79899 Other long term (current) drug therapy: Secondary | ICD-10-CM | POA: Diagnosis not present

## 2021-01-05 DIAGNOSIS — I10 Essential (primary) hypertension: Secondary | ICD-10-CM | POA: Diagnosis not present

## 2021-01-05 DIAGNOSIS — Z9884 Bariatric surgery status: Secondary | ICD-10-CM | POA: Diagnosis not present

## 2021-01-05 DIAGNOSIS — K449 Diaphragmatic hernia without obstruction or gangrene: Secondary | ICD-10-CM | POA: Diagnosis not present

## 2021-01-05 DIAGNOSIS — Z6829 Body mass index (BMI) 29.0-29.9, adult: Secondary | ICD-10-CM | POA: Diagnosis not present

## 2021-01-05 DIAGNOSIS — D508 Other iron deficiency anemias: Secondary | ICD-10-CM | POA: Diagnosis not present

## 2021-01-05 DIAGNOSIS — G4733 Obstructive sleep apnea (adult) (pediatric): Secondary | ICD-10-CM | POA: Diagnosis not present

## 2021-02-20 ENCOUNTER — Telehealth: Payer: Self-pay | Admitting: Internal Medicine

## 2021-02-20 ENCOUNTER — Other Ambulatory Visit: Payer: Self-pay

## 2021-02-20 ENCOUNTER — Encounter: Payer: Self-pay | Admitting: Emergency Medicine

## 2021-02-20 ENCOUNTER — Ambulatory Visit (INDEPENDENT_AMBULATORY_CARE_PROVIDER_SITE_OTHER)
Admit: 2021-02-20 | Discharge: 2021-02-20 | Disposition: A | Discharge status: Home / Self Care | Payer: BC Managed Care – PPO | Attending: Emergency Medicine | Admitting: Emergency Medicine

## 2021-02-20 ENCOUNTER — Ambulatory Visit
Admission: EM | Admit: 2021-02-20 | Discharge: 2021-02-20 | Disposition: A | Discharge status: Home / Self Care | Payer: BC Managed Care – PPO | Attending: Emergency Medicine | Admitting: Emergency Medicine

## 2021-02-20 DIAGNOSIS — Z9049 Acquired absence of other specified parts of digestive tract: Secondary | ICD-10-CM | POA: Diagnosis not present

## 2021-02-20 DIAGNOSIS — Z9889 Other specified postprocedural states: Secondary | ICD-10-CM | POA: Diagnosis not present

## 2021-02-20 DIAGNOSIS — T8189XA Other complications of procedures, not elsewhere classified, initial encounter: Secondary | ICD-10-CM | POA: Diagnosis not present

## 2021-02-20 DIAGNOSIS — T8130XA Disruption of wound, unspecified, initial encounter: Secondary | ICD-10-CM

## 2021-02-20 DIAGNOSIS — M47819 Spondylosis without myelopathy or radiculopathy, site unspecified: Secondary | ICD-10-CM | POA: Diagnosis not present

## 2021-02-20 DIAGNOSIS — T8131XA Disruption of external operation (surgical) wound, not elsewhere classified, initial encounter: Secondary | ICD-10-CM | POA: Diagnosis not present

## 2021-02-20 DIAGNOSIS — R935 Abnormal findings on diagnostic imaging of other abdominal regions, including retroperitoneum: Secondary | ICD-10-CM | POA: Diagnosis not present

## 2021-02-20 LAB — BASIC METABOLIC PANEL
Anion gap: 4 — ABNORMAL LOW (ref 5–15)
BUN: 12 mg/dL (ref 6–20)
CO2: 26 mmol/L (ref 22–32)
Calcium: 8.9 mg/dL (ref 8.9–10.3)
Chloride: 105 mmol/L (ref 98–111)
Creatinine, Ser: 0.71 mg/dL (ref 0.44–1.00)
GFR, Estimated: >60 mL/min (ref >60)
Glucose, Bld: 83 mg/dL (ref 70–99)
Potassium: 3.4 mmol/L — ABNORMAL LOW (ref 3.5–5.1)
Sodium: 135 mmol/L (ref 135–145)

## 2021-02-20 MED ORDER — MUPIROCIN CALCIUM 2 % NA OINT: TOPICAL_OINTMENT | NASAL | 0 refills | Status: DC

## 2021-02-20 MED ORDER — TRIAMCINOLONE ACETONIDE 0.1 % EX CREA: 1.0000 "application " | TOPICAL_CREAM | Freq: Two times a day (BID) | CUTANEOUS | 0 refills | Status: DC

## 2021-02-20 MED ORDER — IOHEXOL 300 MG/ML  SOLN
100.0000 mL | Freq: Once | INTRAMUSCULAR | Status: AC | PRN
  Administered 2021-02-20: 100 mL via INTRAVENOUS

## 2021-02-20 NOTE — ED Triage Notes (Signed)
Patient c/o hiatal hernia surgery 6 weeks ago. She states one of the incision spots on her abdomen is sore which started today. She states the area is mildly bleeding.

## 2021-02-20 NOTE — ED Notes (Signed)
Patient has been approved for CPT [phone]. Obtained via [employer] . CT does not require authorization per insurance.

## 2021-02-20 NOTE — Discharge Instructions (Addendum)
There is no evidence of abscess on your CT scan.  Given that you have small erythematous papules in the line, combined with the erythema and heat along your incision, leads me to believe that this is a suture reaction.  Apply the mupirocin ointment twice daily to the wound to prevent infection and cover it with a Band-Aid.  Apply the triamcinolone ointment twice daily to the wound as well to help decrease inflammation.  Call your surgeon tomorrow to discuss this further with him.

## 2021-02-20 NOTE — Telephone Encounter (Signed)
Patient had surgery 6 1/2 weeks ago and where her incision spot is there is a little corner bleeding, just enough for a Band-Aid. She doesn't know what to do and is wondering if it may be infected. She would like a call from the office.

## 2021-02-20 NOTE — ED Provider Notes (Signed)
[facility]    CSN: [id number] Arrival date & time: 02/20/21  1519      History   Chief Complaint Chief Complaint  Patient presents with  . Wound Check    HPI Erica Hernandez is a 45 y.o. female.   HPI   45 year old female here for evaluation of abdominal incision.  Patient is 6-1/2 weeks status post hiatal hernia repair on 01/04/2021 and states that she developed bleeding from one of the incisions on her abdomen 2 days ago.  Patient reports that the area is sore to touch but denies any redness, swelling, or or feeling the area has been hot.  Patient denies fever, nausea, vomiting.  Patient has not notified her surgeon but was advised to come to the urgent care for evaluation by her primary care office.  Patient is also status post duodenal switch bariatric surgery.  Past Medical History:  Diagnosis Date  . Hypertension    during pregnancy    Patient Active Problem List   Diagnosis Date Noted  . Tail bone pain 11/17/2020  . Dysphagia 11/17/2020  . Gastroesophageal reflux disease without esophagitis 10/11/2020  . Fatty liver 10/11/2020  . Iron deficiency 07/18/2020  . Menstrual irregularity 03/10/2020  . Cervical cancer screening 09/04/2019  . Encounter for birth control 09/04/2019  . Anemia 08/31/2019  . 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/29/2019  . Skin lesion 01/22/2019  . Breast lesion 10/14/2015  . Health care maintenance 10/14/2015  . Abdominal cyst 04/23/2015  . Hypertension, essential 11/19/2014  . Ear pain 10/12/2014  . Stress 07/23/2014  . Fullness in head 07/23/2014  . Headache(784.0) 03/20/2014  . Rectal irritation 03/20/2014  . Sinusitis 01/15/2014    Past Surgical History:  Procedure Laterality Date  . HERNIA REPAIR    . SHOULDER SURGERY  03/30/11  . TONSILLECTOMY  1984    OB History   No obstetric history on file.      Home Medications    Prior to Admission medications   Medication Sig Start Date End Date Taking? Authorizing  Provider  Biotin 10 MG CAPS Take by mouth.   Yes [provider]  mupirocin nasal ointment (BACTROBAN) 2 % Apply to wound 2 times a day. 02/20/21  Yes Becky Augustayan, Charnae Lill, NP  pantoprazole (PROTONIX) 40 MG tablet Take 1 tablet (40 mg total) by mouth at bedtime. 30 minutes before dinner 10/11/20  Yes McLean-Scocuzza, Pasty Spillersracy N, MD  triamcinolone cream (KENALOG) 0.1 % Apply 1 application topically 2 (two) times daily. 02/20/21  Yes Becky Augustayan, Kainon Varady, NP  Calcium Carbonate (CALCIUM 500 PO) Take by mouth. Patient not taking: Reported on 01/01/2021    [provider]  [employer] FIBER GUMMIES PO Take by mouth.    [provider]  tranexamic acid (LYSTEDA) 650 MG TABS tablet Take 1,300 mg by mouth 3 (three) times daily. 05/22/20   [provider]  norethindrone (MICRONOR) 0.35 MG tablet Take 1 tablet (0.35 mg total) by mouth daily. 10/03/19 06/15/20  Dale DurhamScott, Charlene, MD    Family History Family History  Problem Relation Age of Onset  . Hypertension Father   . Diabetes Other        parent  . Breast cancer Maternal Grandmother        60-70  . Breast cancer Paternal Grandmother        [id number]  . Colon cancer Neg Hx     Social History Social History   Tobacco Use  . Smoking status: Never Smoker  . Smokeless tobacco:  Never Used  Vaping Use  . Vaping Use: Never used  Substance Use Topics  . Alcohol use: No    Alcohol/week: 0.0 standard drinks  . Drug use: No     Allergies   Sulfa antibiotics   Review of Systems Review of Systems  Constitutional: Negative for activity change, appetite change and fever.  Gastrointestinal: Positive for abdominal pain. Negative for nausea and vomiting.  Skin: Positive for color change and wound.  Hematological: Negative.   Psychiatric/Behavioral: Negative.      Physical Exam Triage Vital Signs ED Triage Vitals  Enc Vitals Group     BP 02/20/21 1544 127/82     Pulse Rate 02/20/21 1544 83     Resp 02/20/21 1544 18     Temp 02/20/21  1544 98.5 F (36.9 C)     Temp Source 02/20/21 1544 Oral     SpO2 02/20/21 1544 99 %     Weight 02/20/21 1542 205 lb (93 kg)     Height 02/20/21 1542 5\' 7"  (1.702 m)     Head Circumference --      Peak Flow --      Pain Score 02/20/21 1542 0     Pain Loc --      Pain Edu? --      Excl. in GC? --    No data found.  Updated Vital Signs BP 127/82 (BP Location: Left Arm)   Pulse 83   Temp 98.5 F (36.9 C) (Oral)   Resp 18   Ht 5\' 7"  (1.702 m)   Wt 205 lb (93 kg)   LMP 01/30/2021   SpO2 99%   BMI 32.11 kg/m   Visual Acuity Right Eye Distance:   Left Eye Distance:   Bilateral Distance:    Right Eye Near:   Left Eye Near:    Bilateral Near:     Physical Exam Vitals and nursing note reviewed.  Constitutional:      General: She is not in acute distress.    Appearance: Normal appearance. She is obese. She is not ill-appearing.  HENT:     Head: Normocephalic and atraumatic.  Cardiovascular:     Rate and Rhythm: Normal rate and regular rhythm.     Pulses: Normal pulses.     Heart sounds: Normal heart sounds. No murmur heard. No gallop.   Pulmonary:     Effort: Pulmonary effort is normal.     Breath sounds: Normal breath sounds. No wheezing, rhonchi or rales.  Abdominal:     General: Abdomen is flat. Bowel sounds are normal.     Palpations: Abdomen is soft.     Tenderness: There is abdominal tenderness. There is no guarding or rebound.  Skin:    General: Skin is warm and dry.     Capillary Refill: Capillary refill takes less than 2 seconds.     Findings: Erythema present. No rash.  Neurological:     General: No focal deficit present.     Mental Status: She is alert and oriented to person, place, and time.  Psychiatric:        Mood and Affect: Mood normal.        Behavior: Behavior normal.        Thought Content: Thought content normal.        Judgment: Judgment normal.      UC Treatments / Results  Labs (all labs ordered are listed, but only abnormal  results are displayed) Labs Reviewed  BASIC METABOLIC PANEL -  Abnormal; Notable for the following components:      Result Value   Potassium 3.4 (*)    Anion gap 4 (*)    All other components within normal limits    EKG   Radiology CT ABDOMEN PELVIS W CONTRAST  Result Date: 02/20/2021 CLINICAL DATA:  Wound dehiscence. Status post second hernia surgery 4-6 weeks ago. Tender at incision site for 1 day. Status post duodenal switch surgery and hernia repair at the same time. EXAM: CT ABDOMEN AND PELVIS WITH CONTRAST TECHNIQUE: Multidetector CT imaging of the abdomen and pelvis was performed using the standard protocol following bolus administration of intravenous contrast. CONTRAST:  OMNIPAQUE IOHEXOL 300 MG/ML  SOLN COMPARISON:  None. FINDINGS: Lower chest: No acute abnormality. Hepatobiliary: No focal liver abnormality is seen. Status post cholecystectomy. No biliary dilatation. Pancreas: No focal lesion. Normal pancreatic contour. No surrounding inflammatory changes. No main pancreatic ductal dilatation. Spleen: Normal in size without focal abnormality. Adrenals/Urinary Tract: No adrenal nodule bilaterally. Bilateral kidneys enhance symmetrically. No hydronephrosis. No hydroureter. The urinary bladder is unremarkable. Stomach/Bowel: Status post duodenal switch surgical changes noted. Stomach is within normal limits. No evidence of bowel wall thickening or dilatation. Appendix appears normal. Vascular/Lymphatic: No abdominal aorta or iliac aneurysm. No abdominal, pelvic, or inguinal lymphadenopathy. Reproductive: Right corpus luteum cyst noted. The uterus is retroverted. Otherwise uterus and bilateral adnexa are unremarkable. Other: No intraperitoneal free fluid. No intraperitoneal free gas. No organized fluid collection. Musculoskeletal: Hernia repair surgical changes with no recurrent hernia noted. No subcutaneus soft tissue organized fluid collection. No suspicious lytic or blastic osseous  lesions. No acute displaced fracture. Multilevel mild degenerative changes of the spine. IMPRESSION: 1. No ventral hernia hernia recurrence. 2. No acute intra-abdominal or intrapelvic abnormality in a patient status post duodenal switch. Electronically Signed   By: Tish Frederickson M.D.   On: 02/20/2021 18:15    Procedures Procedures (including critical care time)  Medications Ordered in UC Medications - No data to display  Initial Impression / Assessment and Plan / UC Course  I have reviewed the triage vital signs and the nursing notes.  Pertinent labs & imaging results that were available during my care of the patient were reviewed by me and considered in my medical decision making (see chart for details).   Patient is a very pleasant 45 year old female here for evaluation of a surgical wound on her abdomen from hiatal hernia repair 6.5 weeks ago.  Patient reports that 2 days ago she noticed that there was some areas of the incision on the left lower aspect of her abdomen that were starting to come open and bleed a little bit.  Patient denies any drainage from the wound.  Patient denies any erythema or heat coming from the wound.  Patient is also status post duodenal switch bariatric surgery by the same surgeon.  Patient has not contacted her surgeon at this point.  Patient states she has not had a fever.  Physical exam reveals a benign cardiopulmonary exam.  Abdomen is soft, nondistended, with tenderness around the area of wound dehiscence.  The area is also erythematous and hot to the touch.  There is no fluctuance or induration palpable on exam and patient reports that the area is mildly tender to palpation.  Patient exhibits no guarding and there is no rebound tenderness on palpation.  There is some bloody drainage from the wound but no serous or purulent drainage is identified on exam.  Will obtain CT the abdomen pelvis to  look for abscess.  Patient has an abscess will send patient to [facility]  where she had her surgery for evaluation.  If no abscess is present this is most likely a suture reaction.  Will treat with topical mupirocin and topical triamcinolone for infection prevention and to decrease inflammation.  BMP shows very mild hypokalemia with a potassium of 3.4.  BUN is 12 and creatinine is 0.71.  Radiology interpretation CT is  negative for abscess.  We will discharge patient home with a diagnosis of suture reaction on mupirocin and triamcinolone. Final Clinical Impressions(s) / UC Diagnoses   Final diagnoses:  Suture reaction, initial encounter     Discharge Instructions     There is no evidence of abscess on your CT scan.  Given that you have small erythematous papules in the line, combined with the erythema and heat along your incision, leads me to believe that this is a suture reaction.  Apply the mupirocin ointment twice daily to the wound to prevent infection and cover it with a Band-Aid.  Apply the triamcinolone ointment twice daily to the wound as well to help decrease inflammation.  Call your surgeon tomorrow to discuss this further with him.    ED Prescriptions    Medication Sig Dispense Auth. Provider   mupirocin nasal ointment (BACTROBAN) 2 % Apply to wound 2 times a day. 22 g Becky Augusta, NP   triamcinolone cream (KENALOG) 0.1 % Apply 1 application topically 2 (two) times daily. 30 g Becky Augusta, NP     PDMP not reviewed this encounter.   Becky Augusta, NP 02/20/21 1829

## 2021-02-20 NOTE — Telephone Encounter (Signed)
Given I am not in the office this pm, agree with evaluation.  She needs to let surgery know if incision is bleeding and possibly infected.

## 2021-02-20 NOTE — Telephone Encounter (Signed)
Spoken to patient she stated that one of her incision sites is oozing blood and is slightly tender 2/10. NO fever, chills, nauseated, vomiting, redness around incision site, drainage, selling and has taken no medication for sx. Patient believes she is developing an infection. Surgery was 6.5 weeks ago. Due to no availability I instructed patient to go to UC. She stated she will go to [facility] in [place] due to it being the closest

## 2021-03-08 DIAGNOSIS — Z9884 Bariatric surgery status: Secondary | ICD-10-CM | POA: Diagnosis not present

## 2021-03-18 ENCOUNTER — Encounter: Payer: Self-pay | Admitting: Internal Medicine

## 2021-03-19 DIAGNOSIS — R748 Abnormal levels of other serum enzymes: Secondary | ICD-10-CM | POA: Diagnosis not present

## 2021-03-19 DIAGNOSIS — Z9884 Bariatric surgery status: Secondary | ICD-10-CM | POA: Diagnosis not present

## 2021-05-01 ENCOUNTER — Inpatient Hospital Stay: Payer: BC Managed Care – PPO

## 2021-05-01 ENCOUNTER — Encounter: Payer: Self-pay | Admitting: Internal Medicine

## 2021-05-01 ENCOUNTER — Inpatient Hospital Stay: Payer: BC Managed Care – PPO | Attending: Internal Medicine

## 2021-05-01 ENCOUNTER — Inpatient Hospital Stay (HOSPITAL_BASED_OUTPATIENT_CLINIC_OR_DEPARTMENT_OTHER): Payer: BC Managed Care – PPO | Admitting: Internal Medicine

## 2021-05-01 ENCOUNTER — Other Ambulatory Visit: Payer: Self-pay

## 2021-05-01 DIAGNOSIS — D508 Other iron deficiency anemias: Secondary | ICD-10-CM | POA: Insufficient documentation

## 2021-05-01 DIAGNOSIS — Z9884 Bariatric surgery status: Secondary | ICD-10-CM | POA: Diagnosis not present

## 2021-05-01 DIAGNOSIS — G4733 Obstructive sleep apnea (adult) (pediatric): Secondary | ICD-10-CM | POA: Insufficient documentation

## 2021-05-01 DIAGNOSIS — E611 Iron deficiency: Secondary | ICD-10-CM

## 2021-05-01 DIAGNOSIS — K909 Intestinal malabsorption, unspecified: Secondary | ICD-10-CM | POA: Insufficient documentation

## 2021-05-01 DIAGNOSIS — N920 Excessive and frequent menstruation with regular cycle: Secondary | ICD-10-CM | POA: Diagnosis not present

## 2021-05-01 DIAGNOSIS — R5382 Chronic fatigue, unspecified: Secondary | ICD-10-CM | POA: Insufficient documentation

## 2021-05-01 DIAGNOSIS — Z803 Family history of malignant neoplasm of breast: Secondary | ICD-10-CM | POA: Diagnosis not present

## 2021-05-01 LAB — CBC WITH DIFFERENTIAL/PLATELET
Abs Immature Granulocytes: 0.01 10*3/uL (ref 0.00–0.07)
Basophils Absolute: 0 10*3/uL (ref 0.0–0.1)
Basophils Relative: 0 %
Eosinophils Absolute: 0.1 10*3/uL (ref 0.0–0.5)
Eosinophils Relative: 2 %
HCT: 38.2 % (ref 36.0–46.0)
Hemoglobin: 12.8 g/dL (ref 12.0–15.0)
Immature Granulocytes: 0 %
Lymphocytes Relative: 35 %
Lymphs Abs: 1.7 10*3/uL (ref 0.7–4.0)
MCH: 31.3 pg (ref 26.0–34.0)
MCHC: 33.5 g/dL (ref 30.0–36.0)
MCV: 93.4 fL (ref 80.0–100.0)
Monocytes Absolute: 0.4 10*3/uL (ref 0.1–1.0)
Monocytes Relative: 7 %
Neutro Abs: 2.8 10*3/uL (ref 1.7–7.7)
Neutrophils Relative %: 56 %
Platelets: 179 10*3/uL (ref 150–400)
RBC: 4.09 MIL/uL (ref 3.87–5.11)
RDW: 12.1 % (ref 11.5–15.5)
WBC: 5 10*3/uL (ref 4.0–10.5)
nRBC: 0 % (ref 0.0–0.2)

## 2021-05-01 LAB — BASIC METABOLIC PANEL
Anion gap: 7 (ref 5–15)
BUN: 15 mg/dL (ref 6–20)
CO2: 27 mmol/L (ref 22–32)
Calcium: 9 mg/dL (ref 8.9–10.3)
Chloride: 103 mmol/L (ref 98–111)
Creatinine, Ser: 0.79 mg/dL (ref 0.44–1.00)
GFR, Estimated: >60 mL/min (ref >60)
Glucose, Bld: 104 mg/dL — ABNORMAL HIGH (ref 70–99)
Potassium: 3.8 mmol/L (ref 3.5–5.1)
Sodium: 137 mmol/L (ref 135–145)

## 2021-05-01 LAB — FERRITIN: Ferritin: 53 ng/mL (ref 11–307)

## 2021-05-01 LAB — IRON AND TIBC
Iron: 46 ug/dL (ref 28–170)
Saturation Ratios: 14 % (ref 10.4–31.8)
TIBC: 329 ug/dL (ref 250–450)
UIBC: 283 ug/dL

## 2021-05-01 LAB — VITAMIN B12: Vitamin B-12: 931 pg/mL — ABNORMAL HIGH (ref 180–914)

## 2021-05-01 NOTE — Progress Notes (Signed)
[facility] CONSULT NOTE  Patient Care Team: Dale Knightsen, MD as PCP - General (Internal Medicine)  CHIEF COMPLAINTS/PURPOSE OF CONSULTATION:    HEMATOLOGY HISTORY  # IRON DEF ANEMIA -malabsorption /heavy menstrual cycles. 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  female with iron deficiency sec to malabsorption is here for follow-up.  Patient denies any blood in stools or black-colored stools.  Patient denies any nausea vomiting abdominal pain.  Complains of chronic fatigue.  Complains of inability to tolerate CPAP.Marland Kitchen  Review of Systems  Constitutional:  Positive for malaise/fatigue and weight loss (Intentional/gastric bypass). Negative for chills, diaphoresis and fever.  HENT:  Negative for nosebleeds and sore throat.   Eyes:  Negative for double vision.  Respiratory:  Negative for cough, hemoptysis, sputum production, shortness of breath and wheezing.   Cardiovascular:  Negative for chest pain, palpitations, orthopnea and leg swelling.  Gastrointestinal:  Negative for abdominal pain, blood in stool, constipation, diarrhea, heartburn, melena, nausea and vomiting.  Genitourinary:  Negative for dysuria, frequency and urgency.  Musculoskeletal:  Negative for back pain and joint pain.  Skin: Negative.  Negative for itching and rash.  Neurological:  Negative for dizziness, tingling, focal weakness, weakness and headaches.  Endo/Heme/Allergies:  Does not bruise/bleed easily.  Psychiatric/Behavioral:  Negative for depression. The patient is not nervous/anxious and does not have insomnia.    MEDICAL HISTORY:  Past Medical History:  Diagnosis Date   Hypertension    during pregnancy    SURGICAL  HISTORY: Past Surgical History:  Procedure Laterality Date   HERNIA REPAIR     SHOULDER SURGERY  03/30/11   TONSILLECTOMY  1984    SOCIAL HISTORY: Social History   Socioeconomic History   Marital status: Married    Spouse name: Not on file   Number of children: 1   Years of education: Not on file   Highest education level: Not on file  Occupational History    Employer: [employer]  Tobacco Use   Smoking status: Never   Smokeless tobacco: Never  Vaping Use   Vaping Use: Never used  Substance and Sexual Activity   Alcohol use: No    Alcohol/week: 0.0 standard drinks   Drug use: No   Sexual activity: Not on file  Other Topics Concern   Not on file  Social History Narrative   Lives in [place]; 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    Social Determinants of Health   Financial Resource Strain: Not on file  Food Insecurity: Not on file  Transportation Needs: Not on file  Physical Activity: Not on file  Stress: Not on file  Social Connections: Not on file  Intimate Partner Violence: Not on file    FAMILY HISTORY: Family History  Problem Relation Age of Onset   Hypertension Father    Diabetes Other        parent   Breast cancer Maternal Grandmother        45-70   Breast cancer Paternal Grandmother        72-70   Colon cancer Neg Hx     ALLERGIES:  is allergic to sulfa antibiotics.  MEDICATIONS:  Current Outpatient Medications  Medication Sig Dispense Refill   Biotin 10  MG CAPS Take by mouth.     Calcium Carbonate (CALCIUM 500 PO) Take by mouth.     Calcium Carbonate-Vitamin D3 600-400 MG-UNIT TABS Take 1 mg by mouth daily.     [employer] FIBER GUMMIES PO Take by mouth.     mupirocin nasal ointment (BACTROBAN) 2 % Apply to wound 2 times a day. 22 g 0   pantoprazole (PROTONIX) 40 MG tablet Take 1 tablet (40 mg total) by mouth at bedtime. 30 minutes before dinner 90 tablet 3   tranexamic acid (LYSTEDA) 650 MG TABS tablet Take 1,300 mg by mouth 3 (three)  times daily.     No current facility-administered medications for this visit.      PHYSICAL EXAMINATION:   Vitals:   05/01/21 1327  BP: 119/73  Pulse: 78  Temp: 97.6 F (36.4 C)  SpO2: 100%   Filed Weights   05/01/21 1327  Weight: 210 lb 12.8 oz (95.6 kg)    Physical Exam HENT:     Head: Normocephalic and atraumatic.     Mouth/Throat:     Pharynx: No oropharyngeal exudate.  Eyes:     Pupils: Pupils are equal, round, and reactive to light.  Cardiovascular:     Rate and Rhythm: Normal rate and regular rhythm.  Pulmonary:     Effort: Pulmonary effort is normal. No respiratory distress.     Breath sounds: Normal breath sounds. No wheezing.  Abdominal:     General: Bowel sounds are normal. There is no distension.     Palpations: Abdomen is soft. There is no mass.     Tenderness: no abdominal tenderness There is no guarding or rebound.  Musculoskeletal:        General: No tenderness. Normal range of motion.     Cervical back: Normal range of motion and neck supple.  Skin:    General: Skin is warm.  Neurological:     Mental Status: She is alert and oriented to person, place, and time.  Psychiatric:        Mood and Affect: Affect normal.    LABORATORY DATA:  I have reviewed the data as listed Lab Results  Component Value Date   WBC 5.0 05/01/2021   HGB 12.8 05/01/2021   HCT 38.2 05/01/2021   MCV 93.4 05/01/2021   PLT 179 05/01/2021   Recent Labs    06/28/20 1057 07/23/20 1556 08/28/20 1423 11/15/20 0911 02/20/21 1714 05/01/21 1308  NA 138  --  138 140 135 137  K 4.2  --  3.9 4.5 3.4* 3.8  CL 106  --  105 105 105 103  CO2 27  --  27 31 26 27   GLUCOSE 86  --  79 83 83 104*  BUN 15  --  13 13 12 15   CREATININE 0.79  --  0.74 0.75 0.71 0.79  CALCIUM 9.1  --  8.6* 9.3 8.9 9.0  GFRNONAA  --   --  >60  --  >60 >60  PROT 6.8 6.6  --   --   --   --   ALBUMIN 4.3 4.3  --   --   --   --   AST 23 22  --   --   --   --   ALT 51* 41*  --   --   --   --    ALKPHOS 76 68  --   --   --   --   BILITOT 0.4 0.5  --   --   --   --  BILIDIR 0.1 0.1  --   --   --   --      No results found.  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 S/p IV iron. Today hemoglobin is 12.8; iron studies a month ago saturation16% [feb 2022].    # Fatigue-OSA/non-compliant/poor tolerance- CPAP in  #Hold IV iron at this time.    # DISPOSITION: # No infusion today # follow up in 4 months- MD; labs- cbc/bmp; possible ferrahem Dr.B  All questions were answered. The patient knows to call the clinic with any problems, questions or concerns.   Earna Coder, MD 05/01/2021 2:10 PM

## 2021-05-01 NOTE — Assessment & Plan Note (Addendum)
#  Iron deficiency anemia-multifactorial/malabsorption-bariatric surgery; heavy menstrual cycles. S/p IV iron. Today hemoglobin is 12.8; iron studies a month ago saturation16% [feb 2022].  Hold off on infusion.  # Fatigue-OSA/non-compliant/poor tolerance- CPAP.  Recommend follow-up with PCP/pulmonary for reassessment.  TSH normal  # DISPOSITION: # No infusion today # follow up in 4 months- MD; labs- cbc/bmp; possible ferrahem Dr.B

## 2021-05-15 ENCOUNTER — Ambulatory Visit (INDEPENDENT_AMBULATORY_CARE_PROVIDER_SITE_OTHER): Payer: BC Managed Care – PPO | Admitting: Internal Medicine

## 2021-05-15 ENCOUNTER — Other Ambulatory Visit: Payer: Self-pay

## 2021-05-15 VITALS — BP 122/78 | HR 68 | Temp 97.6°F | Resp 16 | Ht 67.0 in | Wt 214.8 lb

## 2021-05-15 DIAGNOSIS — Z13220 Encounter for screening for lipoid disorders: Secondary | ICD-10-CM

## 2021-05-15 DIAGNOSIS — Z1159 Encounter for screening for other viral diseases: Secondary | ICD-10-CM

## 2021-05-15 DIAGNOSIS — Z9884 Bariatric surgery status: Secondary | ICD-10-CM

## 2021-05-15 DIAGNOSIS — I10 Essential (primary) hypertension: Secondary | ICD-10-CM

## 2021-05-15 DIAGNOSIS — Z1231 Encounter for screening mammogram for malignant neoplasm of breast: Secondary | ICD-10-CM

## 2021-05-15 DIAGNOSIS — R1310 Dysphagia, unspecified: Secondary | ICD-10-CM

## 2021-05-15 DIAGNOSIS — Z114 Encounter for screening for human immunodeficiency virus [HIV]: Secondary | ICD-10-CM

## 2021-05-15 DIAGNOSIS — F439 Reaction to severe stress, unspecified: Secondary | ICD-10-CM

## 2021-05-15 DIAGNOSIS — K219 Gastro-esophageal reflux disease without esophagitis: Secondary | ICD-10-CM

## 2021-05-15 DIAGNOSIS — K760 Fatty (change of) liver, not elsewhere classified: Secondary | ICD-10-CM

## 2021-05-15 DIAGNOSIS — D509 Iron deficiency anemia, unspecified: Secondary | ICD-10-CM

## 2021-05-15 MED ORDER — TRANEXAMIC ACID 650 MG PO TABS: 1300.0000 mg | ORAL_TABLET | Freq: Three times a day (TID) | ORAL | 3 refills | Status: DC

## 2021-05-15 NOTE — Progress Notes (Signed)
Patient ID: Erica Hernandez, female   DOB: August 01, 1976, 45 y.o.   MRN: [id number]   Subjective:    Patient ID: Erica Hernandez, female    DOB: 08-05-1976, 45 y.o.   MRN: [id number]  HPI This visit occurred during the SARS-CoV-2 public health emergency.  Safety protocols were in place, including screening questions prior to the visit, additional usage of staff PPE, and extensive cleaning of exam room while observing appropriate contact time as indicated for disinfecting solutions.   Patient here for a scheduled follow up.  Here to follow up regarding her blood pressure and anemia.  Seeing Dr. Donneta Romberg.  Just recently evaluated.  Hemoglobin 12.8.  Did not require iron infusion.  Periods have been regular.  Actually this month she did not require medication (tranexamic acid). States not as heavy.  Did request refill.  Has been stable.  Has seen GYN.  Request to have refilled here if things remain stable (as they have been).  Is walking.  No chest pain reported.  Breathing stable.  No increased cough or congestion.  No increased abdominal pain reported.  No bowel issues reported.  No sick contacts.  No fever.  No nausea or vomiting.   Past Medical History:  Diagnosis Date   Hypertension    during pregnancy   Past Surgical History:  Procedure Laterality Date   HERNIA REPAIR     SHOULDER SURGERY  03/30/11   TONSILLECTOMY  1984   Family History  Problem Relation Age of Onset   Hypertension Father    Diabetes Other        parent   Breast cancer Maternal Grandmother        32-70   Breast cancer Paternal Grandmother        49-70   Colon cancer Neg Hx    Social History   Socioeconomic History   Marital status: Married    Spouse name: Not on file   Number of children: 1   Years of education: Not on file   Highest education level: Not on file  Occupational History    Employer: [employer]  Tobacco Use   Smoking status: Never   Smokeless tobacco: Never  Vaping Use   Vaping Use: Never  used  Substance and Sexual Activity   Alcohol use: No    Alcohol/week: 0.0 standard drinks   Drug use: No   Sexual activity: Not on file  Other Topics Concern   Not on file  Social History Narrative   Lives in [place]; 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    Social Determinants of Health   Financial Resource Strain: Not on file  Food Insecurity: Not on file  Transportation Needs: Not on file  Physical Activity: Not on file  Stress: Not on file  Social Connections: Not on file    Review of Systems  Constitutional:  Negative for appetite change and unexpected weight change.  HENT:  Negative for congestion and sinus pressure.   Respiratory:  Negative for cough, chest tightness and shortness of breath.   Cardiovascular:  Negative for chest pain, palpitations and leg swelling.  Gastrointestinal:  Negative for abdominal pain, diarrhea, nausea and vomiting.  Genitourinary:  Negative for difficulty urinating and dysuria.  Musculoskeletal:  Negative for joint swelling and myalgias.  Skin:  Negative for color change and rash.  Neurological:  Negative for dizziness, light-headedness and headaches.  Psychiatric/Behavioral:  Negative for agitation and dysphoric mood.       Objective:  Physical Exam Vitals reviewed.  Constitutional:      General: She is not in acute distress.    Appearance: Normal appearance.  HENT:     Head: Normocephalic and atraumatic.     Right Ear: External ear normal.     Left Ear: External ear normal.  Eyes:     General: No scleral icterus.       Right eye: No discharge.        Left eye: No discharge.     Conjunctiva/sclera: Conjunctivae normal.  Neck:     Thyroid: No thyromegaly.  Cardiovascular:     Rate and Rhythm: Normal rate and regular rhythm.  Pulmonary:     Effort: No respiratory distress.     Breath sounds: Normal breath sounds. No wheezing.  Abdominal:     General: Bowel sounds are normal.     Palpations: Abdomen is soft.      Tenderness: There is no abdominal tenderness.  Musculoskeletal:        General: No swelling or tenderness.     Cervical back: Neck supple. No tenderness.  Lymphadenopathy:     Cervical: No cervical adenopathy.  Skin:    Findings: No erythema or rash.  Neurological:     Mental Status: She is alert.  Psychiatric:        Mood and Affect: Mood normal.        Behavior: Behavior normal.    BP 122/78   Pulse 68   Temp 97.6 F (36.4 C)   Resp 16   Ht 5\' 7"  (1.702 m)   Wt 214 lb 12.8 oz (97.4 kg)   SpO2 99%   BMI 33.64 kg/m  Wt Readings from Last 3 Encounters:  05/15/21 214 lb 12.8 oz (97.4 kg)  05/01/21 210 lb 12.8 oz (95.6 kg)  02/20/21 205 lb (93 kg)    Outpatient Encounter Medications as of 05/15/2021  Medication Sig   Biotin 10 MG CAPS Take by mouth.   Calcium Carbonate-Vitamin D3 600-400 MG-UNIT TABS Take 1 mg by mouth daily.   [employer] FIBER GUMMIES PO Take by mouth.   pantoprazole (PROTONIX) 40 MG tablet Take 1 tablet (40 mg total) by mouth at bedtime. 30 minutes before dinner   tranexamic acid (LYSTEDA) 650 MG TABS tablet Take 2 tablets (1,300 mg total) by mouth 3 (three) times daily.   [DISCONTINUED] Calcium Carbonate (CALCIUM 500 PO) Take by mouth. (Patient not taking: Reported on 05/15/2021)   [DISCONTINUED] mupirocin nasal ointment (BACTROBAN) 2 % Apply to wound 2 times a day. (Patient not taking: Reported on 05/15/2021)   [DISCONTINUED] norethindrone (MICRONOR) 0.35 MG tablet Take 1 tablet (0.35 mg total) by mouth daily.   [DISCONTINUED] tranexamic acid (LYSTEDA) 650 MG TABS tablet Take 1,300 mg by mouth 3 (three) times daily.   No facility-administered encounter medications on file as of 05/15/2021.     Lab Results  Component Value Date   WBC 5.0 05/01/2021   HGB 12.8 05/01/2021   HCT 38.2 05/01/2021   PLT 179 05/01/2021   GLUCOSE 104 (H) 05/01/2021   CHOL 140 06/28/2020   TRIG 81.0 06/28/2020   HDL 46.00 06/28/2020   LDLCALC 78 06/28/2020   ALT 41 (H) 07/23/2020    AST 22 07/23/2020   NA 137 05/01/2021   K 3.8 05/01/2021   CL 103 05/01/2021   CREATININE 0.79 05/01/2021   BUN 15 05/01/2021   CO2 27 05/01/2021   TSH 1.34 11/15/2020    CT ABDOMEN PELVIS W CONTRAST  Result Date: 02/20/2021 CLINICAL DATA:  Wound dehiscence. Status post second hernia surgery 4-6 weeks ago. Tender at incision site for 1 day. Status post duodenal switch surgery and hernia repair at the same time. EXAM: CT ABDOMEN AND PELVIS WITH CONTRAST TECHNIQUE: Multidetector CT imaging of the abdomen and pelvis was performed using the standard protocol following bolus administration of intravenous contrast. CONTRAST:  OMNIPAQUE IOHEXOL 300 MG/ML  SOLN COMPARISON:  None. FINDINGS: Lower chest: No acute abnormality. Hepatobiliary: No focal liver abnormality is seen. Status post cholecystectomy. No biliary dilatation. Pancreas: No focal lesion. Normal pancreatic contour. No surrounding inflammatory changes. No main pancreatic ductal dilatation. Spleen: Normal in size without focal abnormality. Adrenals/Urinary Tract: No adrenal nodule bilaterally. Bilateral kidneys enhance symmetrically. No hydronephrosis. No hydroureter. The urinary bladder is unremarkable. Stomach/Bowel: Status post duodenal switch surgical changes noted. Stomach is within normal limits. No evidence of bowel wall thickening or dilatation. Appendix appears normal. Vascular/Lymphatic: No abdominal aorta or iliac aneurysm. No abdominal, pelvic, or inguinal lymphadenopathy. Reproductive: Right corpus luteum cyst noted. The uterus is retroverted. Otherwise uterus and bilateral adnexa are unremarkable. Other: No intraperitoneal free fluid. No intraperitoneal free gas. No organized fluid collection. Musculoskeletal: Hernia repair surgical changes with no recurrent hernia noted. No subcutaneus soft tissue organized fluid collection. No suspicious lytic or blastic osseous lesions. No acute displaced fracture. Multilevel mild degenerative  changes of the spine. IMPRESSION: 1. No ventral hernia hernia recurrence. 2. No acute intra-abdominal or intrapelvic abnormality in a patient status post duodenal switch. Electronically Signed   By: Tish Frederickson M.D.   On: 02/20/2021 18:15       Assessment & Plan:   Problem List Items Addressed This Visit     Anemia    History of bariatric surgery.  Also heavy menstrual periods.  Has seen GYN.  Prescribed tranexamic acid.  She request to have refill here since things have been stable.  Also seeing Dr. Donneta Romberg.  Has received IV iron.  Last check hemoglobin 12.8.  Follow.       Dysphagia    Since her hernia repair in February 2022 her swallowing has improved.  Able to eat without difficulty now.       Fatty liver    Has lost weight.  Discussed diet and exercise.  Follow liver function tests.       Gastroesophageal reflux disease without esophagitis    She is status post duodenal switch approximately 18 months ago.  Is status post hiatal hernia repair February 2022.  Followed by Dr. Smitty Cords.  No swallowing problems now.  Continues on Protonix.  No acid reflux reported.       History of bariatric surgery    Status post bariatric surgery.  Followed by Dr. Smitty Cords.  Has done well with her weight loss.  Discussed diet and exercise.       Hypertension, essential    Blood pressure under good control on no medication.  Follow.        Relevant Medications   tranexamic acid (LYSTEDA) 650 MG TABS tablet   Other Relevant Orders   TSH   Stress    Overall appears to be doing well.  Follow-up.       Other Visit Diagnoses     Visit for screening mammogram    -  Primary   Relevant Orders   MM 3D SCREEN BREAST BILATERAL   Screening for HIV without presence of risk factors       Relevant Orders   HIV  antibody (with reflex)   Need for hepatitis C screening test       Relevant Orders   Hepatitis C antibody   Screening cholesterol level       Relevant Orders   Lipid panel         Erica Elberta, Erica Hernandez

## 2021-05-19 ENCOUNTER — Encounter: Payer: Self-pay | Admitting: Internal Medicine

## 2021-05-19 NOTE — Assessment & Plan Note (Signed)
Overall appears to be doing well.  Follow-up.

## 2021-05-19 NOTE — Assessment & Plan Note (Signed)
Has lost weight.  Discussed diet and exercise.  Follow liver function tests.

## 2021-05-19 NOTE — Assessment & Plan Note (Signed)
History of bariatric surgery.  Also heavy menstrual periods.  Has seen GYN.  Prescribed tranexamic acid.  She request to have refill here since things have been stable.  Also seeing Dr. Donneta Romberg.  Has received IV iron.  Last check hemoglobin 12.8.  Follow.

## 2021-05-19 NOTE — Assessment & Plan Note (Signed)
Since her hernia repair in February 2022 her swallowing has improved.  Able to eat without difficulty now.

## 2021-05-19 NOTE — Assessment & Plan Note (Signed)
Blood pressure under good control on no medication.  Follow.

## 2021-05-19 NOTE — Assessment & Plan Note (Signed)
Status post bariatric surgery.  Followed by Dr. Smitty Cords.  Has done well with her weight loss.  Discussed diet and exercise.

## 2021-05-19 NOTE — Assessment & Plan Note (Addendum)
She is status post duodenal switch approximately 18 months ago.  Is status post hiatal hernia repair February 2022.  Followed by Dr. Smitty Cords.  No swallowing problems now.  Continues on Protonix.  No acid reflux reported.

## 2021-05-21 ENCOUNTER — Other Ambulatory Visit: Payer: Self-pay

## 2021-05-21 ENCOUNTER — Ambulatory Visit
Admission: EM | Admit: 2021-05-21 | Discharge: 2021-05-21 | Disposition: A | Discharge status: Home / Self Care | Payer: BC Managed Care – PPO | Attending: Emergency Medicine | Admitting: Emergency Medicine

## 2021-05-21 DIAGNOSIS — Z79899 Other long term (current) drug therapy: Secondary | ICD-10-CM | POA: Insufficient documentation

## 2021-05-21 DIAGNOSIS — U071 COVID-19: Secondary | ICD-10-CM | POA: Diagnosis not present

## 2021-05-21 DIAGNOSIS — J069 Acute upper respiratory infection, unspecified: Secondary | ICD-10-CM

## 2021-05-21 MED ORDER — BENZONATATE 100 MG PO CAPS: 200.0000 mg | ORAL_CAPSULE | Freq: Three times a day (TID) | ORAL | 0 refills | Status: DC

## 2021-05-21 MED ORDER — PROMETHAZINE-DM 6.25-15 MG/5ML PO SYRP: 5.0000 mL | ORAL_SOLUTION | Freq: Four times a day (QID) | ORAL | 0 refills | Status: DC | PRN

## 2021-05-21 MED ORDER — IPRATROPIUM BROMIDE 0.06 % NA SOLN: 2.0000 | Freq: Four times a day (QID) | NASAL | 12 refills | Status: DC

## 2021-05-21 NOTE — ED Triage Notes (Signed)
Pt c/o headache, sore throat, cough for about 2 days. Pt states she took an at-home COVID test yesterday and it was negative. Pt reports a co-worker tested positive today, she was around this person yesterday. Pt denies n/v/d, reports possible fever.

## 2021-05-21 NOTE — ED Provider Notes (Signed)
[facility]    CSN: [id number] Arrival date & time: 05/21/21  0943      History   Chief Complaint Chief Complaint  Patient presents with   Cough   Nasal Congestion   Sore Throat    HPI Erica Hernandez is a 45 y.o. female.   HPI  45 year old female here for evaluation of respiratory complaints.  Patient reports significant for the last 2 days she has been experiencing headache, sore throat, and nonproductive cough.  She has had a subjective fever, runny nose nasal congestion, sore throat, and ear pressure associated with her above-mentioned symptoms.  She denies any shortness of breath or wheezing, body aches, or GI complaints.  She has been exposed to a coworker who tested positive for COVID yesterday.  Patient is taken at home COVID test which was negative.  Past Medical History:  Diagnosis Date   Hypertension    during pregnancy    Patient Active Problem List   Diagnosis Date Noted   Tail bone pain 11/17/2020   Dysphagia 11/17/2020   Gastroesophageal reflux disease without esophagitis 10/11/2020   Fatty liver 10/11/2020   Iron deficiency 07/18/2020   Menstrual irregularity 03/10/2020   Cervical cancer screening 09/04/2019   Encounter for birth control 09/04/2019   Anemia 08/31/2019   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/29/2019   Skin lesion 01/22/2019   Breast lesion 10/14/2015   Health care maintenance 10/14/2015   Abdominal cyst 04/23/2015   Hypertension, essential 11/19/2014   Ear pain 10/12/2014   Stress 07/23/2014   Fullness in head 07/23/2014   Headache(784.0) 03/20/2014   Rectal irritation 03/20/2014   Sinusitis 01/15/2014    Past Surgical History:  Procedure Laterality Date   HERNIA REPAIR     SHOULDER SURGERY  03/30/11   TONSILLECTOMY  1984    OB History   No obstetric history on file.      Home Medications    Prior to Admission medications   Medication Sig Start Date End Date Taking? Authorizing Provider  benzonatate  (TESSALON) 100 MG capsule Take 2 capsules (200 mg total) by mouth every 8 (eight) hours. 05/21/21  Yes Becky Augusta, NP  Biotin 10 MG CAPS Take by mouth.   Yes [provider]  Calcium Carbonate-Vitamin D3 600-400 MG-UNIT TABS Take 1 mg by mouth daily.   Yes [provider]  [employer] FIBER GUMMIES PO Take by mouth.   Yes [provider]  ipratropium (ATROVENT) 0.06 % nasal spray Place 2 sprays into both nostrils 4 (four) times daily. 05/21/21  Yes Becky Augusta, NP  pantoprazole (PROTONIX) 40 MG tablet Take 1 tablet (40 mg total) by mouth at bedtime. 30 minutes before dinner 10/11/20  Yes McLean-Scocuzza, Pasty Spillers, MD  promethazine-dextromethorphan (PROMETHAZINE-DM) 6.25-15 MG/5ML syrup Take 5 mLs by mouth 4 (four) times daily as needed. 05/21/21  Yes Becky Augusta, NP  tranexamic acid (LYSTEDA) 650 MG TABS tablet Take 2 tablets (1,300 mg total) by mouth 3 (three) times daily. 05/15/21  Yes Dale Wildwood, MD  norethindrone (MICRONOR) 0.35 MG tablet Take 1 tablet (0.35 mg total) by mouth daily. 10/03/19 06/15/20  Dale Mayer, MD    Family History Family History  Problem Relation Age of Onset   Hypertension Father    Diabetes Other        parent   Breast cancer Maternal Grandmother        42-70   Breast cancer Paternal Grandmother        71-70   Colon  cancer Neg Hx     Social History Social History   Tobacco Use   Smoking status: Never   Smokeless tobacco: Never  Vaping Use   Vaping Use: Never used  Substance Use Topics   Alcohol use: No    Alcohol/week: 0.0 standard drinks   Drug use: No     Allergies   Sulfa antibiotics   Review of Systems Review of Systems  Constitutional:  Positive for fatigue. Negative for activity change and appetite change.  HENT:  Positive for congestion, ear pain, rhinorrhea and sore throat.   Respiratory:  Positive for cough. Negative for shortness of breath and wheezing.   Gastrointestinal:  Negative for diarrhea, nausea and  vomiting.  Musculoskeletal:  Negative for arthralgias and myalgias.  Skin:  Negative for rash.  Neurological:  Positive for headaches.  Hematological: Negative.   Psychiatric/Behavioral: Negative.      Physical Exam Triage Vital Signs ED Triage Vitals [05/21/21 1006]  Enc Vitals Group     BP      Pulse      Resp      Temp      Temp src      SpO2      Weight 211 lb (95.7 kg)     Height 5\' 7"  (1.702 m)     Head Circumference      Peak Flow      Pain Score 4     Pain Loc      Pain Edu?      Excl. in GC?    No data found.  Updated Vital Signs BP 126/87 (BP Location: Left Arm)   Pulse 90   Temp 99.3 F (37.4 C) (Oral)   Resp 18   Ht 5\' 7"  (1.702 m)   Wt 211 lb (95.7 kg)   LMP 05/11/2021   SpO2 97%   BMI 33.05 kg/m   Visual Acuity Right Eye Distance:   Left Eye Distance:   Bilateral Distance:    Right Eye Near:   Left Eye Near:    Bilateral Near:     Physical Exam Vitals and nursing note reviewed.  Constitutional:      General: She is not in acute distress.    Appearance: Normal appearance. She is not ill-appearing.  HENT:     Head: Normocephalic and atraumatic.     Right Ear: Tympanic membrane, ear canal and external ear normal. There is no impacted cerumen.     Left Ear: Tympanic membrane, ear canal and external ear normal. There is no impacted cerumen.     Nose: Congestion and rhinorrhea present.     Mouth/Throat:     Mouth: Mucous membranes are moist.     Pharynx: Oropharynx is clear. Posterior oropharyngeal erythema present.  Cardiovascular:     Rate and Rhythm: Normal rate and regular rhythm.     Pulses: Normal pulses.     Heart sounds: Normal heart sounds. No murmur heard.   No gallop.  Pulmonary:     Effort: Pulmonary effort is normal.     Breath sounds: Normal breath sounds. No wheezing, rhonchi or rales.  Musculoskeletal:     Cervical back: Normal range of motion and neck supple.  Lymphadenopathy:     Cervical: No cervical adenopathy.   Skin:    General: Skin is warm and dry.     Capillary Refill: Capillary refill takes less than 2 seconds.     Findings: No erythema or rash.  Neurological:  General: No focal deficit present.     Mental Status: She is alert and oriented to person, place, and time.  Psychiatric:        Mood and Affect: Mood normal.        Behavior: Behavior normal.        Thought Content: Thought content normal.        Judgment: Judgment normal.     UC Treatments / Results  Labs (all labs ordered are listed, but only abnormal results are displayed) Labs Reviewed  SARS CORONAVIRUS 2 (TAT 6-24 HRS)    EKG   Radiology No results found.  Procedures Procedures (including critical care time)  Medications Ordered in UC Medications - No data to display  Initial Impression / Assessment and Plan / UC Course  I have reviewed the triage vital signs and the nursing notes.  Pertinent labs & imaging results that were available during my care of the patient were reviewed by me and considered in my medical decision making (see chart for details).  Patient is a very pleasant and nontoxic-appearing 45 year old female here for evaluation of respiratory complaints as outlined in HPI above and with a recent exposure to a COVID-positive patient.  Patient's physical exam reveals pearly gray tympanic membranes bilaterally with a normal light reflex and clear external auditory canals.  Nasal mucosa is erythematous and edematous with clear nasal discharge.  Oropharyngeal exam reveals surgically absent tonsillar pillars.  The posterior oropharynx is very mild erythema with clear postnasal drip.  No cervical lymphadenopathy appreciated exam.  Cardiopulmonary exam is benign.  Patient is taken on at home COVID test that was negative.  Will send COVID PCR test here.  Patient vies to isolate pending the results of the tests and if she is positive she will need quarantine for 5 days when her symptoms started.  Will  prescribe Atrovent nasal spray, Tessalon Perles, Promethazine DM cough syrup to help with cough and nasal congestion.  Patient advised that due to her history of hypertension, anemia, and obesity she qualifies for the antiviral therapy.  Patient advised that if she test positive I will prescribe the Paxlovid antivirals unless there are interactions with her medications and that case I will prescribe her the molnupiravir as she is in the window for treatment.  Work note provided to cover the isolation.  If she is positive we will extend her work note to cover full quarantine duration.   Final Clinical Impressions(s) / UC Diagnoses   Final diagnoses:  Viral URI with cough     Discharge Instructions      Isolate at home pending the results of your COVID test.  If you test positive then you will have to quarantine for 5 days from the start of your symptoms.  After 5 days you can break quarantine if your symptoms have improved and you have not had a fever for 24 hours without taking Tylenol or ibuprofen.  Use over-the-counter Tylenol and ibuprofen as needed for body aches and fever.  Use the Tessalon Perles during the day as needed for cough and the Promethazine DM cough syrup at nighttime as will make you drowsy.  Use the Atrovent nasal spray, 2 squirts in each nsotril every 6 hours, as needed fro nasal congestion and runny nose/postnasal drip.  If you develop any increased shortness of breath-especially at rest, you are unable to speak in full sentences, or is a late sign your lips are turning blue you need to go the ER for evaluation.  ED Prescriptions     Medication Sig Dispense Auth. Provider   benzonatate (TESSALON) 100 MG capsule Take 2 capsules (200 mg total) by mouth every 8 (eight) hours. 21 capsule Becky Augusta, NP   ipratropium (ATROVENT) 0.06 % nasal spray Place 2 sprays into both nostrils 4 (four) times daily. 15 mL Becky Augusta, NP   promethazine-dextromethorphan  (PROMETHAZINE-DM) 6.25-15 MG/5ML syrup Take 5 mLs by mouth 4 (four) times daily as needed. 118 mL Becky Augusta, NP      PDMP not reviewed this encounter.   Becky Augusta, NP 05/21/21 1031

## 2021-05-21 NOTE — Discharge Instructions (Addendum)
Isolate at home pending the results of your COVID test.  If you test positive then you will have to quarantine for 5 days from the start of your symptoms.  After 5 days you can break quarantine if your symptoms have improved and you have not had a fever for 24 hours without taking Tylenol or ibuprofen.  Use over-the-counter Tylenol and ibuprofen as needed for body aches and fever.  Use the Tessalon Perles during the day as needed for cough and the Promethazine DM cough syrup at nighttime as will make you drowsy.  Use the Atrovent nasal spray, 2 squirts in each nsotril every 6 hours, as needed fro nasal congestion and runny nose/postnasal drip.  If you develop any increased shortness of breath-especially at rest, you are unable to speak in full sentences, or is a late sign your lips are turning blue you need to go the ER for evaluation.

## 2021-05-22 ENCOUNTER — Telehealth (HOSPITAL_COMMUNITY): Payer: Self-pay | Admitting: Emergency Medicine

## 2021-05-22 LAB — SARS CORONAVIRUS 2 (TAT 6-24 HRS): SARS Coronavirus 2: POSITIVE — AB

## 2021-05-22 MED ORDER — NIRMATRELVIR/RITONAVIR (PAXLOVID)TABLET: 3.0000 | ORAL_TABLET | Freq: Two times a day (BID) | ORAL | 0 refills | Status: AC

## 2021-05-22 MED ORDER — NIRMATRELVIR/RITONAVIR (PAXLOVID)TABLET
3.0000 | ORAL_TABLET | Freq: Two times a day (BID) | ORAL | 0 refills | Status: DC
Start: 2021-05-22 — End: 2021-05-22

## 2021-05-22 NOTE — Telephone Encounter (Signed)
Paxlovid, approved by Dr. Leonides Grills

## 2021-05-29 ENCOUNTER — Other Ambulatory Visit: Payer: BC Managed Care – PPO

## 2021-06-18 ENCOUNTER — Other Ambulatory Visit: Payer: Self-pay

## 2021-06-18 ENCOUNTER — Other Ambulatory Visit (INDEPENDENT_AMBULATORY_CARE_PROVIDER_SITE_OTHER): Payer: BC Managed Care – PPO

## 2021-06-18 DIAGNOSIS — Z13220 Encounter for screening for lipoid disorders: Secondary | ICD-10-CM

## 2021-06-18 DIAGNOSIS — Z114 Encounter for screening for human immunodeficiency virus [HIV]: Secondary | ICD-10-CM | POA: Diagnosis not present

## 2021-06-18 DIAGNOSIS — I10 Essential (primary) hypertension: Secondary | ICD-10-CM | POA: Diagnosis not present

## 2021-06-18 DIAGNOSIS — Z1159 Encounter for screening for other viral diseases: Secondary | ICD-10-CM | POA: Diagnosis not present

## 2021-06-18 LAB — LIPID PANEL
Cholesterol: 137 mg/dL (ref 0–200)
HDL: 48.7 mg/dL (ref >39.00)
LDL Cholesterol: 78 mg/dL (ref 0–99)
NonHDL: 87.95
Total CHOL/HDL Ratio: 3
Triglycerides: 52 mg/dL (ref 0.0–149.0)
VLDL: 10.4 mg/dL (ref 0.0–40.0)

## 2021-06-18 LAB — TSH: TSH: 1.95 u[IU]/mL (ref 0.35–5.50)

## 2021-06-19 LAB — HIV ANTIBODY (ROUTINE TESTING W REFLEX): HIV 1&2 Ab, 4th Generation: NONREACTIVE

## 2021-06-19 LAB — HEPATITIS C ANTIBODY
Hepatitis C Ab: NONREACTIVE
SIGNAL TO CUT-OFF: 0.12 (ref <1.00)

## 2021-07-16 DIAGNOSIS — G4733 Obstructive sleep apnea (adult) (pediatric): Secondary | ICD-10-CM | POA: Diagnosis not present

## 2021-07-16 DIAGNOSIS — Z6833 Body mass index (BMI) 33.0-33.9, adult: Secondary | ICD-10-CM | POA: Diagnosis not present

## 2021-07-16 DIAGNOSIS — R5383 Other fatigue: Secondary | ICD-10-CM | POA: Diagnosis not present

## 2021-07-16 DIAGNOSIS — Z9884 Bariatric surgery status: Secondary | ICD-10-CM | POA: Diagnosis not present

## 2021-07-29 ENCOUNTER — Other Ambulatory Visit: Payer: Self-pay

## 2021-07-29 ENCOUNTER — Ambulatory Visit
Admission: RE | Admit: 2021-07-29 | Discharge: 2021-07-29 | Disposition: A | Discharge status: Home / Self Care | Payer: BC Managed Care – PPO | Source: Ambulatory Visit | Attending: Internal Medicine | Admitting: Internal Medicine

## 2021-07-29 DIAGNOSIS — Z1231 Encounter for screening mammogram for malignant neoplasm of breast: Secondary | ICD-10-CM | POA: Insufficient documentation

## 2021-08-18 ENCOUNTER — Ambulatory Visit
Admission: EM | Admit: 2021-08-18 | Discharge: 2021-08-18 | Disposition: A | Discharge status: Home / Self Care | Payer: BC Managed Care – PPO | Attending: Emergency Medicine | Admitting: Emergency Medicine

## 2021-08-18 ENCOUNTER — Ambulatory Visit (INDEPENDENT_AMBULATORY_CARE_PROVIDER_SITE_OTHER): Payer: BC Managed Care – PPO

## 2021-08-18 ENCOUNTER — Other Ambulatory Visit: Payer: Self-pay

## 2021-08-18 DIAGNOSIS — R109 Unspecified abdominal pain: Secondary | ICD-10-CM | POA: Diagnosis not present

## 2021-08-18 DIAGNOSIS — K5900 Constipation, unspecified: Secondary | ICD-10-CM | POA: Insufficient documentation

## 2021-08-18 DIAGNOSIS — R1031 Right lower quadrant pain: Secondary | ICD-10-CM | POA: Insufficient documentation

## 2021-08-18 LAB — POCT URINALYSIS DIP (DEVICE)
Bilirubin Urine: NEGATIVE
Glucose, UA: NEGATIVE mg/dL
Ketones, ur: NEGATIVE mg/dL
Leukocytes,Ua: NEGATIVE
Nitrite: NEGATIVE
Protein, ur: NEGATIVE mg/dL
Specific Gravity, Urine: 1.015 (ref 1.005–1.030)
Urobilinogen, UA: 0.2 mg/dL (ref 0.0–1.0)
pH: 6.5 (ref 5.0–8.0)

## 2021-08-18 NOTE — ED Triage Notes (Signed)
Pt c.o upper right abd pain since last night. Pt reports some nausea after eating this morning. Pt denies any other symptoms.

## 2021-08-18 NOTE — Discharge Instructions (Addendum)
Increase your oral water intake so the 2 are remaining well-hydrated and there is increased water to help soften your bowel.  Take over-the-counter MiraLAX, 1 capful (17 g) 8 8 ounces of beverage of your choice daily until you are having normal bowel movements.  You can also take 2 over-the-counter Dulcolax tablets along with your first dose of MiraLAX to help move your stools along.  Increase your oral dietary fiber intake so as to add bulk to your stool and help decrease incidence of constipation.  If you have any increasing abdominal pain, blood in your stool, abdominal bloating, or fever please follow-up in the emergency department for evaluation.

## 2021-08-18 NOTE — ED Provider Notes (Signed)
[facility]    CSN: [id number] Arrival date & time: 08/18/21  0807      History   Chief Complaint Chief Complaint  Patient presents with   Abdominal Pain    URQ    HPI Erica Hernandez is a 45 y.o. female.   HPI  45 female here for evaluation of abdominal pain.  Patient reports that she developed right upper quadrant abdominal pain last night that may have radiated through to her back but she is unsure.  The pain started around 5 PM and has been off and on ever since.  The pain does not increase with eating but she did have nausea after eating breakfast this morning.  She never had anything like this previously.  She denies fever, vomiting or diarrhea.  She denies any painful urination or urinary urgency or frequency.  Patient has had her gallbladder removed.  Patient reports that she had her last normal bowel movement this morning but does admit that it was less volume than normal.  Past Medical History:  Diagnosis Date   Hypertension    during pregnancy    Patient Active Problem List   Diagnosis Date Noted   Tail bone pain 11/17/2020   Dysphagia 11/17/2020   Gastroesophageal reflux disease without esophagitis 10/11/2020   Fatty liver 10/11/2020   Iron deficiency 07/18/2020   Menstrual irregularity 03/10/2020   Cervical cancer screening 09/04/2019   Encounter for birth control 09/04/2019   Anemia 08/31/2019   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/29/2019   Skin lesion 01/22/2019   Breast lesion 10/14/2015   Health care maintenance 10/14/2015   Abdominal cyst 04/23/2015   Hypertension, essential 11/19/2014   Ear pain 10/12/2014   Stress 07/23/2014   Fullness in head 07/23/2014   Headache(784.0) 03/20/2014   Rectal irritation 03/20/2014   Sinusitis 01/15/2014    Past Surgical History:  Procedure Laterality Date   HERNIA REPAIR     SHOULDER SURGERY  03/30/11   TONSILLECTOMY  1984    OB History   No obstetric history on file.      Home  Medications    Prior to Admission medications   Medication Sig Start Date End Date Taking? Authorizing Provider  pantoprazole (PROTONIX) 40 MG tablet Take 1 tablet (40 mg total) by mouth at bedtime. 30 minutes before dinner 10/11/20  Yes McLean-Scocuzza, Pasty Spillers, MD  tranexamic acid (LYSTEDA) 650 MG TABS tablet Take 2 tablets (1,300 mg total) by mouth 3 (three) times daily. 05/15/21  Yes Dale Twinsburg, MD  Biotin 10 MG CAPS Take by mouth.    [provider]  Calcium Carbonate-Vitamin D3 600-400 MG-UNIT TABS Take 1 mg by mouth daily.    [provider]  [employer] FIBER GUMMIES PO Take by mouth.    [provider]  ipratropium (ATROVENT) 0.06 % nasal spray Place 2 sprays into both nostrils 4 (four) times daily. 05/21/21   Becky Augusta, NP  norethindrone (MICRONOR) 0.35 MG tablet Take 1 tablet (0.35 mg total) by mouth daily. 10/03/19 06/15/20  Dale Lanham, MD    Family History Family History  Problem Relation Age of Onset   Hypertension Father    Diabetes Other        parent   Breast cancer Maternal Grandmother        39-70   Breast cancer Paternal Grandmother        67-70   Colon cancer Neg Hx     Social History Social History   Tobacco Use  Smoking status: Never   Smokeless tobacco: Never  Vaping Use   Vaping Use: Never used  Substance Use Topics   Alcohol use: No    Alcohol/week: 0.0 standard drinks   Drug use: No     Allergies   Sulfa antibiotics   Review of Systems Review of Systems  Constitutional:  Negative for activity change, appetite change and fever.  Gastrointestinal:  Positive for abdominal pain. Negative for constipation, diarrhea, nausea and vomiting.  Musculoskeletal:  Positive for back pain.  Skin:  Negative for rash.  Hematological: Negative.   Psychiatric/Behavioral: Negative.      Physical Exam Triage Vital Signs ED Triage Vitals  Enc Vitals Group     BP 08/18/21 0819 114/74     Pulse Rate 08/18/21 0819 74     Resp  08/18/21 0819 18     Temp 08/18/21 0819 98.1 F (36.7 C)     Temp Source 08/18/21 0819 Oral     SpO2 08/18/21 0819 100 %     Weight 08/18/21 0817 213 lb (96.6 kg)     Height 08/18/21 0817 5\' 10"  (1.778 m)     Head Circumference --      Peak Flow --      Pain Score 08/18/21 0817 3     Pain Loc --      Pain Edu? --      Excl. in GC? --    No data found.  Updated Vital Signs BP 114/74 (BP Location: Left Arm)   Pulse 74   Temp 98.1 F (36.7 C) (Oral)   Resp 18   Ht 5\' 10"  (1.778 m)   Wt 213 lb (96.6 kg)   LMP 07/25/2021   SpO2 100%   BMI 30.56 kg/m   Visual Acuity Right Eye Distance:   Left Eye Distance:   Bilateral Distance:    Right Eye Near:   Left Eye Near:    Bilateral Near:     Physical Exam Vitals and nursing note reviewed.  Constitutional:      General: She is not in acute distress.    Appearance: Normal appearance. She is obese. She is not ill-appearing.  HENT:     Head: Normocephalic and atraumatic.  Cardiovascular:     Rate and Rhythm: Normal rate and regular rhythm.     Pulses: Normal pulses.     Heart sounds: Normal heart sounds. No murmur heard.   No gallop.  Pulmonary:     Effort: Pulmonary effort is normal.     Breath sounds: Normal breath sounds. No wheezing, rhonchi or rales.  Abdominal:     General: Abdomen is flat. Bowel sounds are normal. There is no distension.     Palpations: Abdomen is soft.     Tenderness: There is no abdominal tenderness. There is no right CVA tenderness, left CVA tenderness, guarding or rebound.  Skin:    General: Skin is warm and dry.     Capillary Refill: Capillary refill takes less than 2 seconds.     Findings: No erythema or rash.  Neurological:     General: No focal deficit present.     Mental Status: She is alert and oriented to person, place, and time.  Psychiatric:        Mood and Affect: Mood normal.        Behavior: Behavior normal.        Thought Content: Thought content normal.        Judgment:  Judgment normal.  UC Treatments / Results  Labs (all labs ordered are listed, but only abnormal results are displayed) Labs Reviewed  POCT URINALYSIS DIP (DEVICE) - Abnormal; Notable for the following components:      Result Value   Hgb urine dipstick LARGE (*)    All other components within normal limits  POCT URINALYSIS DIPSTICK, ED / UC    EKG   Radiology DG Abd 2 Views  Result Date: 08/18/2021 CLINICAL DATA:  Upper right abdomen pain since last night. EXAM: ABDOMEN - 2 VIEW COMPARISON:  None. FINDINGS: The bowel gas pattern is normal. Extensive bowel content is identified throughout colon. There is no evidence of free air. No radio-opaque calculi or other significant radiographic abnormality is seen. IMPRESSION: No bowel obstruction. Constipation. Electronically Signed   By: Sherian Rein M.D.   On: 08/18/2021 08:54    Procedures Procedures (including critical care time)  Medications Ordered in UC Medications - No data to display  Initial Impression / Assessment and Plan / UC Course  I have reviewed the triage vital signs and the nursing notes.  Pertinent labs & imaging results that were available during my care of the patient were reviewed by me and considered in my medical decision making (see chart for details).  Patient is a nontoxic-appearing 18 old female here for evaluation of right upper quadrant abdominal pain that started on 5 PM last night and has been intermittent ever since.  The pain does not increase with eating but she did become nauseous after eating this morning.  She states she did have some back pain last night but is not sure if it was related to the abdominal pain or not.  Patient has a significant abdominal surgical history.  Patient has had a cholecystectomy, hernia repair, and duodenal switch bariatric surgery.  Patient is not had a fever and denies any urinary symptoms.  Patient is in no acute distress and her vital signs are within normal limits.   She not currently having pain at present.  Physical exam reveals benign cardiopulmonary exam with clear lung sounds in all fields.  Patient has no CVA tenderness on exam.  Abdomen is soft, nondistended, and nontender with positive bowel sounds in all quadrants.  I advised the patient that even though she is had a gallbladder removal she could still have developed a gallstone.  There could also be other pathology at play given her extensive abdominal surgeries.  I explained to the patient that she would best be evaluated with abdominal imaging and lab work that I am unable to do in the urgent care today.  We will check a two-view abdomen to look for any signs of obstruction or constipation which may be causing her pain and we will check urinalysis just to ensure that this is not an atypical presentation for a UTI.  Patient vies that if her work-up today in the urgent care is negative her options would be to be evaluated in the emergency department or to follow-up with her primary care provider tomorrow for outpatient work-up and imaging.  CT abdomen independently reviewed and evaluated by me.  There is some scattered stool in the distal colon near the rectal vault.  There is a large volume of air in the transverse colon.  No distinct air-fluid levels noted on exam.  Radiology overread is pending. Radiology impression is negative for bowel obstruction but is positive for constipation as they list extensive bowel content throughout colon.  Point-of-care urinalysis shows large hemoglobin but is  negative for nitrites, or leukocytes.  No protein.  Medical assistant who processed the urinalysis question about the blood in the urine and she states that it looked like there was possibly the start of menstrual blood in the urine.  Patient's last menstrual cycle was on 07/25/2021 so she is due to start her cycle any day.  We will treat patient for constipation with over-the-counter MiraLAX and Dulcolax tablets.  Patient  advised that if her pain returns, or increases, to follow-up in the emergency department.  Especially if there is an associated fever.  Otherwise she can follow-up with her primary care doctor or bariatric surgeon next week.   Final Clinical Impressions(s) / UC Diagnoses   Final diagnoses:  Abdominal pain  Constipation, unspecified constipation type     Discharge Instructions      Increase your oral water intake so the 2 are remaining well-hydrated and there is increased water to help soften your bowel.  Take over-the-counter MiraLAX, 1 capful (17 g) 8 8 ounces of beverage of your choice daily until you are having normal bowel movements.  You can also take 2 over-the-counter Dulcolax tablets along with your first dose of MiraLAX to help move your stools along.  Increase your oral dietary fiber intake so as to add bulk to your stool and help decrease incidence of constipation.  If you have any increasing abdominal pain, blood in your stool, abdominal bloating, or fever please follow-up in the emergency department for evaluation.     ED Prescriptions   None    PDMP not reviewed this encounter.   Becky Augusta, NP 08/18/21 [phone]

## 2021-08-21 ENCOUNTER — Other Ambulatory Visit: Payer: Self-pay | Admitting: Internal Medicine

## 2021-08-21 ENCOUNTER — Telehealth: Payer: Self-pay | Admitting: *Deleted

## 2021-08-21 NOTE — Telephone Encounter (Signed)
Called patient to inform her of change in treatment to [facility] for insurance authorization. Patient verbalized understanding.

## 2021-08-22 DIAGNOSIS — G4733 Obstructive sleep apnea (adult) (pediatric): Secondary | ICD-10-CM | POA: Diagnosis not present

## 2021-08-30 ENCOUNTER — Inpatient Hospital Stay: Payer: BC Managed Care – PPO

## 2021-08-30 ENCOUNTER — Inpatient Hospital Stay (HOSPITAL_BASED_OUTPATIENT_CLINIC_OR_DEPARTMENT_OTHER): Payer: BC Managed Care – PPO | Admitting: Internal Medicine

## 2021-08-30 ENCOUNTER — Inpatient Hospital Stay: Payer: BC Managed Care – PPO | Attending: Internal Medicine

## 2021-08-30 ENCOUNTER — Other Ambulatory Visit: Payer: Self-pay

## 2021-08-30 VITALS — BP 133/81 | HR 71

## 2021-08-30 DIAGNOSIS — D509 Iron deficiency anemia, unspecified: Secondary | ICD-10-CM | POA: Insufficient documentation

## 2021-08-30 DIAGNOSIS — E611 Iron deficiency: Secondary | ICD-10-CM | POA: Diagnosis not present

## 2021-08-30 LAB — BASIC METABOLIC PANEL
Anion gap: 6 (ref 5–15)
BUN: 17 mg/dL (ref 6–20)
CO2: 26 mmol/L (ref 22–32)
Calcium: 8.8 mg/dL — ABNORMAL LOW (ref 8.9–10.3)
Chloride: 104 mmol/L (ref 98–111)
Creatinine, Ser: 0.75 mg/dL (ref 0.44–1.00)
GFR, Estimated: >60 mL/min (ref >60)
Glucose, Bld: 82 mg/dL (ref 70–99)
Potassium: 3.8 mmol/L (ref 3.5–5.1)
Sodium: 136 mmol/L (ref 135–145)

## 2021-08-30 LAB — CBC WITH DIFFERENTIAL/PLATELET
Abs Immature Granulocytes: 0.02 10*3/uL (ref 0.00–0.07)
Basophils Absolute: 0 10*3/uL (ref 0.0–0.1)
Basophils Relative: 0 %
Eosinophils Absolute: 0.1 10*3/uL (ref 0.0–0.5)
Eosinophils Relative: 2 %
HCT: 37.4 % (ref 36.0–46.0)
Hemoglobin: 12.6 g/dL (ref 12.0–15.0)
Immature Granulocytes: 0 %
Lymphocytes Relative: 31 %
Lymphs Abs: 1.8 10*3/uL (ref 0.7–4.0)
MCH: 31.3 pg (ref 26.0–34.0)
MCHC: 33.7 g/dL (ref 30.0–36.0)
MCV: 92.8 fL (ref 80.0–100.0)
Monocytes Absolute: 0.5 10*3/uL (ref 0.1–1.0)
Monocytes Relative: 8 %
Neutro Abs: 3.4 10*3/uL (ref 1.7–7.7)
Neutrophils Relative %: 59 %
Platelets: 209 10*3/uL (ref 150–400)
RBC: 4.03 MIL/uL (ref 3.87–5.11)
RDW: 12.2 % (ref 11.5–15.5)
WBC: 5.9 10*3/uL (ref 4.0–10.5)
nRBC: 0 % (ref 0.0–0.2)

## 2021-08-30 MED ORDER — IRON SUCROSE 20 MG/ML IV SOLN
200.0000 mg | Freq: Once | INTRAVENOUS | Status: AC
  Administered 2021-08-30: 200 mg via INTRAVENOUS
  Filled 2021-08-30: qty 10

## 2021-08-30 MED ORDER — SODIUM CHLORIDE 0.9 % IV SOLN: 200.0000 mg | Freq: Once | INTRAVENOUS | Status: DC

## 2021-08-30 MED ORDER — SODIUM CHLORIDE 0.9 % IV SOLN
Freq: Once | INTRAVENOUS | Status: AC
  Filled 2021-08-30: qty 250

## 2021-08-30 NOTE — Progress Notes (Signed)
Pt has no concerns/complaints at this time. 

## 2021-08-30 NOTE — Progress Notes (Signed)
[facility] CONSULT NOTE  Patient Care Team: Dale Wyola, MD as PCP - General (Internal Medicine)  CHIEF COMPLAINTS/PURPOSE OF CONSULTATION:    HEMATOLOGY HISTORY  # IRON DEF ANEMIA -malabsorption /heavy menstrual cycles. 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    HISTORY OF PRESENTING ILLNESS:  Erica Hernandez 45 y.o.  female with iron deficiency sec to malabsorption is here for follow-up.  Complains of ongoing fatigue.  No nausea vomiting.  Sleep study negative for CPAP.  Patient denies any blood in stools or black-colored stools.  Patient denies any nausea vomiting abdominal pain.  Review of Systems  Constitutional:  Positive for malaise/fatigue and weight loss (Intentional/gastric bypass). Negative for chills, diaphoresis and fever.  HENT:  Negative for nosebleeds and sore throat.   Eyes:  Negative for double vision.  Respiratory:  Negative for cough, hemoptysis, sputum production, shortness of breath and wheezing.   Cardiovascular:  Negative for chest pain, palpitations, orthopnea and leg swelling.  Gastrointestinal:  Negative for abdominal pain, blood in stool, constipation, diarrhea, heartburn, melena, nausea and vomiting.  Genitourinary:  Negative for dysuria, frequency and urgency.  Musculoskeletal:  Negative for back pain and joint pain.  Skin: Negative.  Negative for itching and rash.  Neurological:  Negative for dizziness, tingling, focal weakness, weakness and headaches.  Endo/Heme/Allergies:  Does not bruise/bleed easily.  Psychiatric/Behavioral:  Negative for depression. The patient is not nervous/anxious and does not have insomnia.    MEDICAL HISTORY:  Past Medical History:  Diagnosis Date   Hypertension     during pregnancy    SURGICAL HISTORY: Past Surgical History:  Procedure Laterality Date   HERNIA REPAIR     SHOULDER SURGERY  03/30/11   TONSILLECTOMY  1984    SOCIAL HISTORY: Social History   Socioeconomic History   Marital status: Married    Spouse name: Not on file   Number of children: 1   Years of education: Not on file   Highest education level: Not on file  Occupational History    Employer: [employer]  Tobacco Use   Smoking status: Never   Smokeless tobacco: Never  Vaping Use   Vaping Use: Never used  Substance and Sexual Activity   Alcohol use: No    Alcohol/week: 0.0 standard drinks   Drug use: No   Sexual activity: Not on file  Other Topics Concern   Not on file  Social History Narrative   Lives in [place]; 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    Social Determinants of Health   Financial Resource Strain: Not on file  Food Insecurity: Not on file  Transportation Needs: Not on file  Physical Activity: Not on file  Stress: Not on file  Social Connections: Not on file  Intimate Partner Violence: Not on file    FAMILY HISTORY: Family History  Problem Relation Age of Onset   Hypertension Father    Diabetes Other        parent   Breast cancer Maternal Grandmother        88-70   Breast cancer Paternal Grandmother        68-70   Colon cancer Neg Hx     ALLERGIES:  is allergic to sulfa antibiotics.  MEDICATIONS:  Current Outpatient Medications  Medication  Sig Dispense Refill   Biotin 10 MG CAPS Take by mouth.     Calcium Carbonate-Vitamin D3 600-400 MG-UNIT TABS Take 1 mg by mouth daily.     [employer] FIBER GUMMIES PO Take by mouth.     ipratropium (ATROVENT) 0.06 % nasal spray Place 2 sprays into both nostrils 4 (four) times daily. 15 mL 12   pantoprazole (PROTONIX) 40 MG tablet Take 1 tablet (40 mg total) by mouth at bedtime. 30 minutes before dinner 90 tablet 3   tranexamic acid (LYSTEDA) 650 MG TABS tablet Take 2 tablets (1,300 mg  total) by mouth 3 (three) times daily. 30 tablet 3   No current facility-administered medications for this visit.      PHYSICAL EXAMINATION:   Vitals:   08/30/21 1257  BP: 126/82  Pulse: 77  Resp: 18  Temp: 98.7 F (37.1 C)  SpO2: 98%   Filed Weights   08/30/21 1257  Weight: 221 lb 9.6 oz (100.5 kg)    Physical Exam HENT:     Head: Normocephalic and atraumatic.     Mouth/Throat:     Pharynx: No oropharyngeal exudate.  Eyes:     Pupils: Pupils are equal, round, and reactive to light.  Cardiovascular:     Rate and Rhythm: Normal rate and regular rhythm.  Pulmonary:     Effort: Pulmonary effort is normal. No respiratory distress.     Breath sounds: Normal breath sounds. No wheezing.  Abdominal:     General: Bowel sounds are normal. There is no distension.     Palpations: Abdomen is soft. There is no mass.     Tenderness: There is no abdominal tenderness. There is no guarding or rebound.  Musculoskeletal:        General: No tenderness. Normal range of motion.     Cervical back: Normal range of motion and neck supple.  Skin:    General: Skin is warm.  Neurological:     Mental Status: She is alert and oriented to person, place, and time.  Psychiatric:        Mood and Affect: Affect normal.    LABORATORY DATA:  I have reviewed the data as listed Lab Results  Component Value Date   WBC 5.9 08/30/2021   HGB 12.6 08/30/2021   HCT 37.4 08/30/2021   MCV 92.8 08/30/2021   PLT 209 08/30/2021   Recent Labs    02/20/21 1714 05/01/21 1308 08/30/21 1245  NA 135 137 136  K 3.4* 3.8 3.8  CL 105 103 104  CO2 26 27 26   GLUCOSE 83 104* 82  BUN 12 15 17   CREATININE 0.71 0.79 0.75  CALCIUM 8.9 9.0 8.8*  GFRNONAA >60 >60 >60     DG Abd 2 Views  Result Date: 08/18/2021 CLINICAL DATA:  Upper right abdomen pain since last night. EXAM: ABDOMEN - 2 VIEW COMPARISON:  None. FINDINGS: The bowel gas pattern is normal. Extensive bowel content is identified throughout  colon. There is no evidence of free air. No radio-opaque calculi or other significant radiographic abnormality is seen. IMPRESSION: No bowel obstruction. Constipation. Electronically Signed   By: M.D.   On: 08/18/2021 08:54    Iron deficiency #Iron deficiency anemia-multifactorial/malabsorption-bariatric surgery; heavy menstrual cycles.  .  S/p IV iron. Today hemoglobin is 12.2;Symptomatic/fatigue iron studies a month ago saturation16% [june 2022].  Discussed re: barimelts.   # DISPOSITION: # venofer # follow up in 6 months- MD; labs- cbc/bmp;ironstudies/ferritin- possible venoferDr.B  All questions were  answered. The patient knows to call the clinic with any problems, questions or concerns.   Earna Coder, MD 08/30/2021 4:40 PM

## 2021-08-30 NOTE — Assessment & Plan Note (Addendum)
#  Iron deficiency anemia-multifactorial/malabsorption-bariatric surgery; heavy menstrual cycles.  .  S/p IV iron. Today hemoglobin is 12.2;Symptomatic/fatigue iron studies a month ago saturation16% [june 2022].  Discussed re: barimelts.   # DISPOSITION: # venofer # follow up in 6 months- MD; labs- cbc/bmp;ironstudies/ferritin- possible venoferDr.B

## 2021-08-30 NOTE — Patient Instructions (Signed)

## 2021-09-17 ENCOUNTER — Ambulatory Visit (INDEPENDENT_AMBULATORY_CARE_PROVIDER_SITE_OTHER): Payer: BC Managed Care – PPO | Admitting: Internal Medicine

## 2021-09-17 ENCOUNTER — Other Ambulatory Visit: Payer: Self-pay

## 2021-09-17 VITALS — BP 124/70 | HR 68 | Temp 97.6°F | Resp 16 | Ht 67.0 in | Wt 221.0 lb

## 2021-09-17 DIAGNOSIS — D509 Iron deficiency anemia, unspecified: Secondary | ICD-10-CM

## 2021-09-17 DIAGNOSIS — Z9884 Bariatric surgery status: Secondary | ICD-10-CM

## 2021-09-17 DIAGNOSIS — K219 Gastro-esophageal reflux disease without esophagitis: Secondary | ICD-10-CM

## 2021-09-17 DIAGNOSIS — Z23 Encounter for immunization: Secondary | ICD-10-CM | POA: Diagnosis not present

## 2021-09-17 DIAGNOSIS — K760 Fatty (change of) liver, not elsewhere classified: Secondary | ICD-10-CM | POA: Diagnosis not present

## 2021-09-17 DIAGNOSIS — B351 Tinea unguium: Secondary | ICD-10-CM

## 2021-09-17 DIAGNOSIS — F439 Reaction to severe stress, unspecified: Secondary | ICD-10-CM | POA: Diagnosis not present

## 2021-09-17 DIAGNOSIS — R5383 Other fatigue: Secondary | ICD-10-CM | POA: Diagnosis not present

## 2021-09-17 LAB — VITAMIN D 25 HYDROXY (VIT D DEFICIENCY, FRACTURES): VITD: 46.76 ng/mL (ref 30.00–100.00)

## 2021-09-17 LAB — COMPREHENSIVE METABOLIC PANEL
ALT: 48 U/L — ABNORMAL HIGH (ref 0–35)
AST: 30 U/L (ref 0–37)
Albumin: 4.2 g/dL (ref 3.5–5.2)
Alkaline Phosphatase: 87 U/L (ref 39–117)
BUN: 18 mg/dL (ref 6–23)
CO2: 28 mEq/L (ref 19–32)
Calcium: 8.9 mg/dL (ref 8.4–10.5)
Chloride: 106 mEq/L (ref 96–112)
Creatinine, Ser: 0.71 mg/dL (ref 0.40–1.20)
GFR: 104.16 mL/min (ref >60.00)
Glucose, Bld: 79 mg/dL (ref 70–99)
Potassium: 4.4 mEq/L (ref 3.5–5.1)
Sodium: 139 mEq/L (ref 135–145)
Total Bilirubin: 0.4 mg/dL (ref 0.2–1.2)
Total Protein: 6.4 g/dL (ref 6.0–8.3)

## 2021-09-17 LAB — VITAMIN B12: Vitamin B-12: 1512 pg/mL — ABNORMAL HIGH (ref 211–911)

## 2021-09-17 MED ORDER — CICLOPIROX 8 % EX SOLN: Freq: Every day | CUTANEOUS | 1 refills | Status: DC

## 2021-09-17 NOTE — Progress Notes (Signed)
Patient ID: Erica Hernandez, female   DOB: Sep 29, 1976, 45 y.o.   MRN: [id number]   Subjective:    Patient ID: Erica Hernandez, female    DOB: 1976/04/14, 45 y.o.   MRN: [id number]  This visit occurred during the SARS-CoV-2 public health emergency.  Safety protocols were in place, including screening questions prior to the visit, additional usage of staff PPE, and extensive cleaning of exam room while observing appropriate contact time as indicated for disinfecting solutions.   Patient here for her physcial Chief Complaint  Patient presents with   Annual Exam   .   HPI Changed to f/u appt.  Having period today.  Reports increased fatigue.  Has history of anemia.  Receiving iron infusions.  HST - negative.  Discussed sleep.  May get up one time per night to urinate.  Discussed exercise.  Not exercising regularly.  No chest pain or sob reported.  No abdominal pain reported.  Blood pressure ok.  Does report toenail - right foot - thickened.    Past Medical History:  Diagnosis Date   Hypertension    during pregnancy   Past Surgical History:  Procedure Laterality Date   HERNIA REPAIR     SHOULDER SURGERY  03/30/11   TONSILLECTOMY  1984   Family History  Problem Relation Age of Onset   Hypertension Father    Diabetes Other        parent   Breast cancer Maternal Grandmother        37-70   Breast cancer Paternal Grandmother        62-70   Colon cancer Neg Hx    Social History   Socioeconomic History   Marital status: Married    Spouse name: Not on file   Number of children: 1   Years of education: Not on file   Highest education level: Not on file  Occupational History    Employer: [employer]  Tobacco Use   Smoking status: Never   Smokeless tobacco: Never  Vaping Use   Vaping Use: Never used  Substance and Sexual Activity   Alcohol use: No    Alcohol/week: 0.0 standard drinks   Drug use: No   Sexual activity: Not on file  Other Topics Concern   Not on file  Social  History Narrative   Lives in [place]; 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    Social Determinants of Health   Financial Resource Strain: Not on file  Food Insecurity: Not on file  Transportation Needs: Not on file  Physical Activity: Not on file  Stress: Not on file  Social Connections: Not on file     Review of Systems  Constitutional:  Positive for fatigue. Negative for appetite change and unexpected weight change.  HENT:  Negative for congestion and sinus pressure.   Respiratory:  Negative for cough, chest tightness and shortness of breath.   Cardiovascular:  Negative for chest pain, palpitations and leg swelling.  Gastrointestinal:  Negative for abdominal pain, diarrhea, nausea and vomiting.  Genitourinary:  Negative for difficulty urinating and dysuria.  Musculoskeletal:  Negative for joint swelling and myalgias.  Skin:  Negative for color change and rash.  Neurological:  Negative for dizziness, light-headedness and headaches.  Psychiatric/Behavioral:  Negative for agitation and dysphoric mood.       Objective:     BP 124/70   Pulse 68   Temp 97.6 F (36.4 C)   Resp 16   Ht 5\' 7"  (1.702 m)  Wt 221 lb (100.2 kg)   SpO2 99%   BMI 34.61 kg/m  Wt Readings from Last 3 Encounters:  09/17/21 221 lb (100.2 kg)  08/30/21 221 lb 9.6 oz (100.5 kg)  08/18/21 213 lb (96.6 kg)    Physical Exam Vitals reviewed.  Constitutional:      General: She is not in acute distress.    Appearance: Normal appearance.  HENT:     Head: Normocephalic and atraumatic.     Right Ear: External ear normal.     Left Ear: External ear normal.  Eyes:     General: No scleral icterus.       Right eye: No discharge.        Left eye: No discharge.     Conjunctiva/sclera: Conjunctivae normal.  Neck:     Thyroid: No thyromegaly.  Cardiovascular:     Rate and Rhythm: Normal rate and regular rhythm.  Pulmonary:     Effort: No respiratory distress.     Breath sounds: Normal breath  sounds. No wheezing.  Abdominal:     General: Bowel sounds are normal.     Palpations: Abdomen is soft.     Tenderness: There is no abdominal tenderness.  Musculoskeletal:        General: No swelling or tenderness.     Cervical back: Neck supple. No tenderness.  Lymphadenopathy:     Cervical: No cervical adenopathy.  Skin:    Findings: No erythema or rash.  Neurological:     Mental Status: She is alert.  Psychiatric:        Mood and Affect: Mood normal.        Behavior: Behavior normal.     Outpatient Encounter Medications as of 09/17/2021  Medication Sig   ciclopirox (PENLAC) 8 % solution Apply topically at bedtime. Apply over nail and surrounding skin. Apply daily over previous coat. After seven (7) days, may remove with alcohol and continue cycle.   Biotin 10 MG CAPS Take by mouth.   Calcium Carbonate-Vitamin D3 600-400 MG-UNIT TABS Take 1 mg by mouth daily.   [employer] FIBER GUMMIES PO Take by mouth.   pantoprazole (PROTONIX) 40 MG tablet Take 1 tablet (40 mg total) by mouth at bedtime. 30 minutes before dinner   tranexamic acid (LYSTEDA) 650 MG TABS tablet Take 2 tablets (1,300 mg total) by mouth 3 (three) times daily.   [DISCONTINUED] ipratropium (ATROVENT) 0.06 % nasal spray Place 2 sprays into both nostrils 4 (four) times daily.   [DISCONTINUED] norethindrone (MICRONOR) 0.35 MG tablet Take 1 tablet (0.35 mg total) by mouth daily.   No facility-administered encounter medications on file as of 09/17/2021.     Lab Results  Component Value Date   WBC 5.9 08/30/2021   HGB 12.6 08/30/2021   HCT 37.4 08/30/2021   PLT 209 08/30/2021   GLUCOSE 79 09/17/2021   CHOL 137 06/18/2021   TRIG 52.0 06/18/2021   HDL 48.70 06/18/2021   LDLCALC 78 06/18/2021   ALT 48 (H) 09/17/2021   AST 30 09/17/2021   NA 139 09/17/2021   K 4.4 09/17/2021   CL 106 09/17/2021   CREATININE 0.71 09/17/2021   BUN 18 09/17/2021   CO2 28 09/17/2021   TSH 1.95 06/18/2021    DG Abd 2 Views  Result  Date: 08/18/2021 CLINICAL DATA:  Upper right abdomen pain since last night. EXAM: ABDOMEN - 2 VIEW COMPARISON:  None. FINDINGS: The bowel gas pattern is normal. Extensive bowel content is identified throughout colon. There  is no evidence of free air. No radio-opaque calculi or other significant radiographic abnormality is seen. IMPRESSION: No bowel obstruction. Constipation. Electronically Signed   By: Sherian Rein M.D.   On: 08/18/2021 08:54       Assessment & Plan:   Problem List Items Addressed This Visit     Anemia    History of bariatric surgery.  Also heavy menstrual periods.  Has seen GYN.  Prescribed tranexamic acid.  Also seeing Dr. Donneta Romberg.  Has received IV iron.  Last check hemoglobin 12.8.  Follow.      Fatigue    Recent HST negative for sleep apnea. Discussed exercise.  Also check labs, including B12 and vitamin D.        Relevant Orders   Comprehensive metabolic panel (Completed)   Vitamin B12 (Completed)   VITAMIN D 25 Hydroxy (Vit-D Deficiency, Fractures) (Completed)   Hepatic function panel   Fatty liver    Has lost weight.  Discussed diet and exercise.  Follow liver function tests.      Gastroesophageal reflux disease without esophagitis    She is status post duodenal switch approximately 18 months ago.  Is status post hiatal hernia repair February 2022.  Followed by Dr. Smitty Cords.  No swallowing problems now.  Continues on Protonix.  No acid reflux reported.      History of bariatric surgery    S/p bariatric surgery.  Recheck cbc today.  Has lost a significant amount of weight.  Continue f/u with Dr Smitty Cords.        Stress    Overall appears to be doing well.  Follow-up.      Toenail fungus    Toenail change appears to be c/w fungus.  Discussed treatment options.  Wants to try penlac.       Relevant Medications   ciclopirox (PENLAC) 8 % solution   Other Visit Diagnoses     Need for immunization against influenza    -  Primary   Relevant Orders   Flu  Vaccine QUAD 52mo+IM (Fluarix, Fluzone & Alfiuria Quad PF) (Completed)        Dale , MD

## 2021-09-22 ENCOUNTER — Encounter: Payer: Self-pay | Admitting: Internal Medicine

## 2021-09-22 DIAGNOSIS — B351 Tinea unguium: Secondary | ICD-10-CM | POA: Insufficient documentation

## 2021-09-22 NOTE — Assessment & Plan Note (Signed)
S/p bariatric surgery.  Recheck cbc today.  Has lost a significant amount of weight.  Continue f/u with Dr Smitty Cords.

## 2021-09-22 NOTE — Assessment & Plan Note (Signed)
Overall appears to be doing well.  Follow-up.

## 2021-09-22 NOTE — Assessment & Plan Note (Signed)
She is status post duodenal switch approximately 18 months ago.  Is status post hiatal hernia repair February 2022.  Followed by Dr. Smitty Cords.  No swallowing problems now.  Continues on Protonix.  No acid reflux reported.

## 2021-09-22 NOTE — Assessment & Plan Note (Signed)
Has lost weight.  Discussed diet and exercise.  Follow liver function tests.

## 2021-09-22 NOTE — Assessment & Plan Note (Signed)
History of bariatric surgery.  Also heavy menstrual periods.  Has seen GYN.  Prescribed tranexamic acid.  Also seeing Dr. Donneta Romberg.  Has received IV iron.  Last check hemoglobin 12.8.  Follow.

## 2021-09-22 NOTE — Assessment & Plan Note (Signed)
Toenail change appears to be c/w fungus.  Discussed treatment options.  Wants to try penlac.

## 2021-09-22 NOTE — Assessment & Plan Note (Signed)
Recent HST negative for sleep apnea. Discussed exercise.  Also check labs, including B12 and vitamin D.

## 2021-10-01 ENCOUNTER — Other Ambulatory Visit: Payer: Self-pay | Admitting: Internal Medicine

## 2021-10-01 DIAGNOSIS — K219 Gastro-esophageal reflux disease without esophagitis: Secondary | ICD-10-CM

## 2021-10-09 ENCOUNTER — Telehealth: Payer: Self-pay | Admitting: Internal Medicine

## 2021-10-09 DIAGNOSIS — K760 Fatty (change of) liver, not elsewhere classified: Secondary | ICD-10-CM

## 2021-10-09 DIAGNOSIS — R7989 Other specified abnormal findings of blood chemistry: Secondary | ICD-10-CM

## 2021-10-09 NOTE — Telephone Encounter (Signed)
Order placed for f/u lab.   

## 2021-10-09 NOTE — Telephone Encounter (Signed)
Patient has a lab appt on 10/11/2021, there are no orders in. 

## 2021-10-09 NOTE — Addendum Note (Signed)
Addended by: Charm Barges on: 10/09/2021 03:03 PM   Modules accepted: Orders

## 2021-10-11 ENCOUNTER — Other Ambulatory Visit: Payer: Self-pay

## 2021-10-11 ENCOUNTER — Other Ambulatory Visit (INDEPENDENT_AMBULATORY_CARE_PROVIDER_SITE_OTHER): Payer: BC Managed Care – PPO

## 2021-10-11 DIAGNOSIS — R7989 Other specified abnormal findings of blood chemistry: Secondary | ICD-10-CM | POA: Diagnosis not present

## 2021-10-11 LAB — HEPATIC FUNCTION PANEL
ALT: 45 U/L — ABNORMAL HIGH (ref 0–35)
AST: 31 U/L (ref 0–37)
Albumin: 4.1 g/dL (ref 3.5–5.2)
Alkaline Phosphatase: 87 U/L (ref 39–117)
Bilirubin, Direct: 0 mg/dL (ref 0.0–0.3)
Total Bilirubin: 0.3 mg/dL (ref 0.2–1.2)
Total Protein: 6.4 g/dL (ref 6.0–8.3)

## 2021-10-14 ENCOUNTER — Encounter: Payer: Self-pay | Admitting: Internal Medicine

## 2021-10-14 NOTE — Telephone Encounter (Signed)
See result note on this pt.  I am ok with holding on obtaining an abdominal ultrasound.

## 2021-10-14 NOTE — Telephone Encounter (Signed)
Can you help with this.  The attachment states this is lab from 03/08/21.  I just repeated lab - liver function tests on her - 10/11/21 - see note.  Let me know if I need to do something more.

## 2021-10-14 NOTE — Telephone Encounter (Signed)
See result note concerning patient elevated liver enzymes.

## 2021-11-05 ENCOUNTER — Other Ambulatory Visit: Payer: Self-pay | Admitting: Internal Medicine

## 2021-12-09 ENCOUNTER — Encounter: Payer: Self-pay | Admitting: Internal Medicine

## 2021-12-10 NOTE — Telephone Encounter (Signed)
If she prefers to get here, we will need diagnosis codes.  Ok to order if prefers and has code.

## 2021-12-26 ENCOUNTER — Encounter: Payer: BC Managed Care – PPO | Admitting: Internal Medicine

## 2022-02-20 IMAGING — MG MM DIGITAL SCREENING BILAT W/ TOMO AND CAD
8 series · 8 of 24 positions shown · non-contrast
Comparison: Previous exam(s).

CLINICAL DATA: Screening.

EXAM:
DIGITAL SCREENING BILATERAL MAMMOGRAM WITH TOMOSYNTHESIS AND CAD
TECHNIQUE: Bilateral screening digital craniocaudal and mediolateral oblique
mammograms were obtained. Bilateral screening digital breast
tomosynthesis was performed. The images were evaluated with
computer-aided detection.

[L MLO synth-2D]
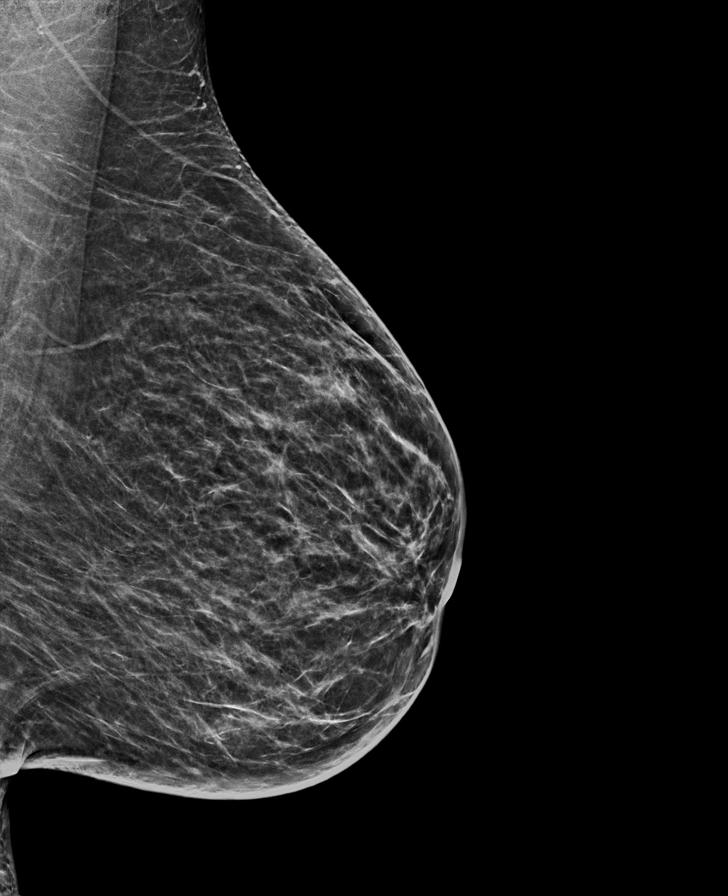

[R CC synth-2D]
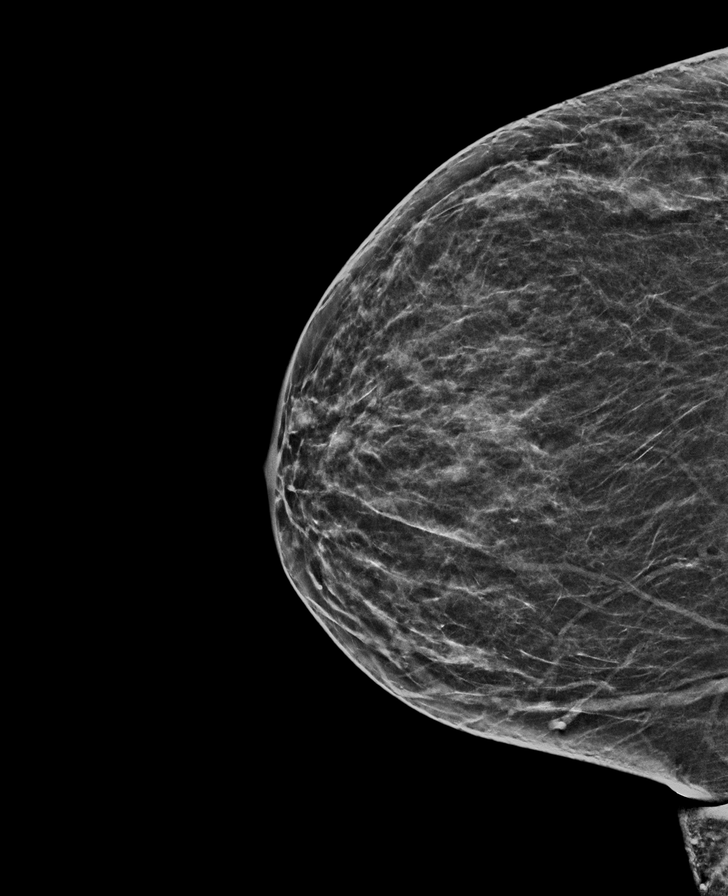

[L CC synth-2D]
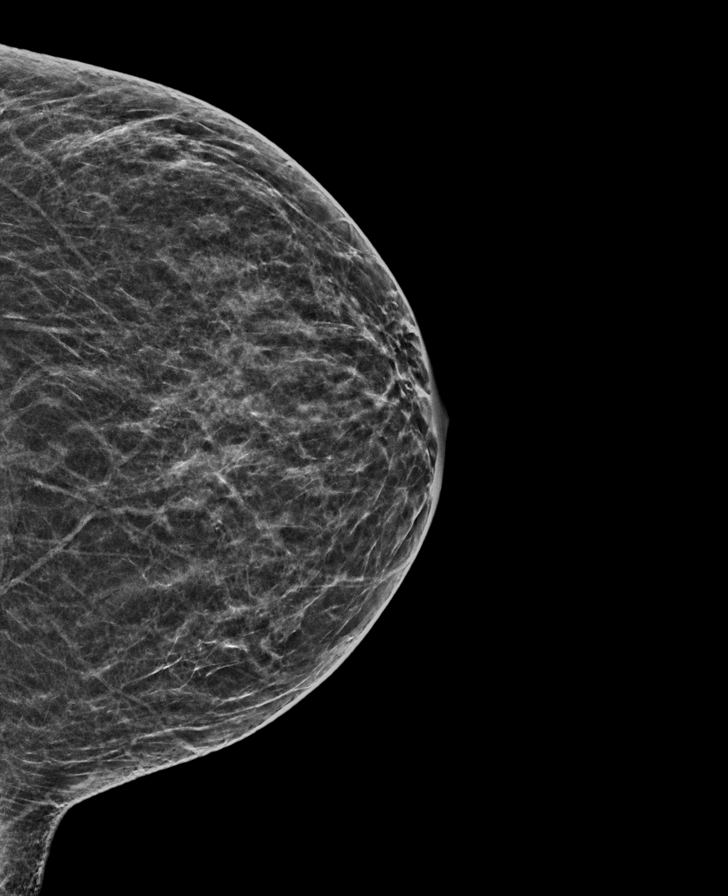

[R MLO synth-2D]
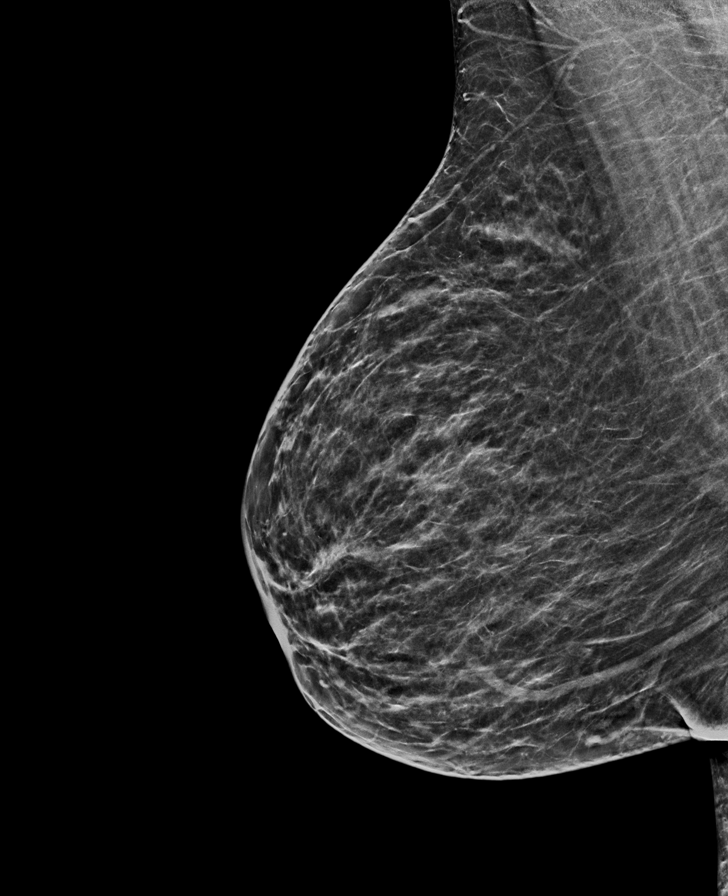

[R MLO tomo · tomo slice 31/62.0]
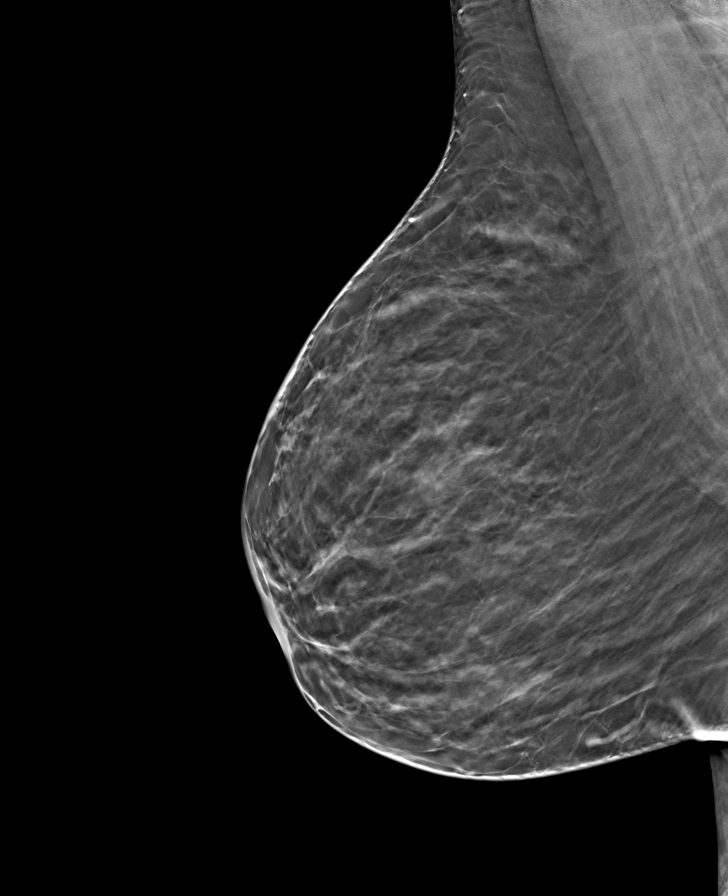

[R CC tomo · tomo slice 29/56.0]
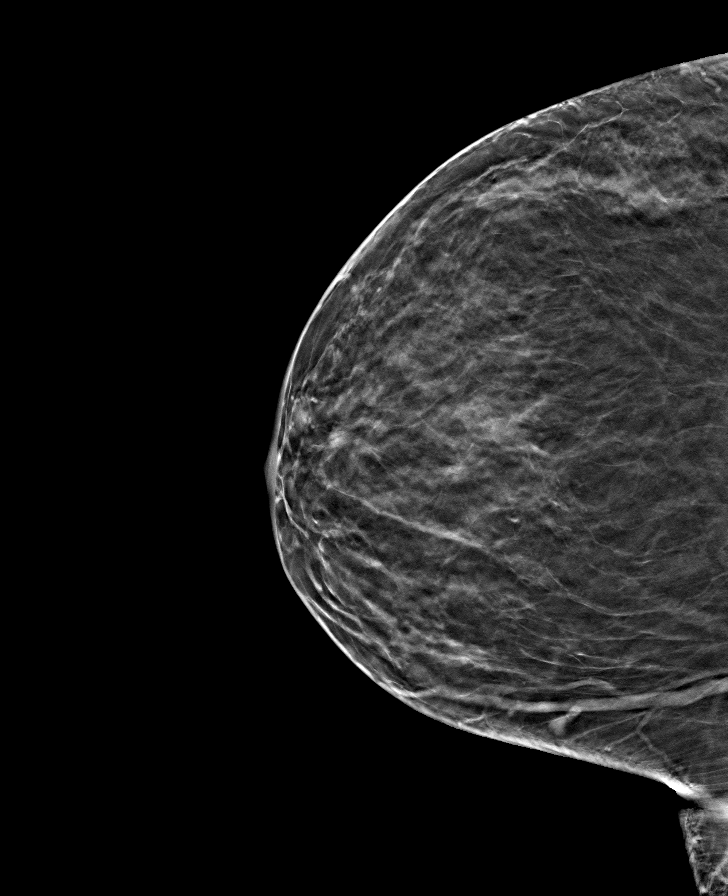

[L MLO tomo · tomo slice 31/61.0]
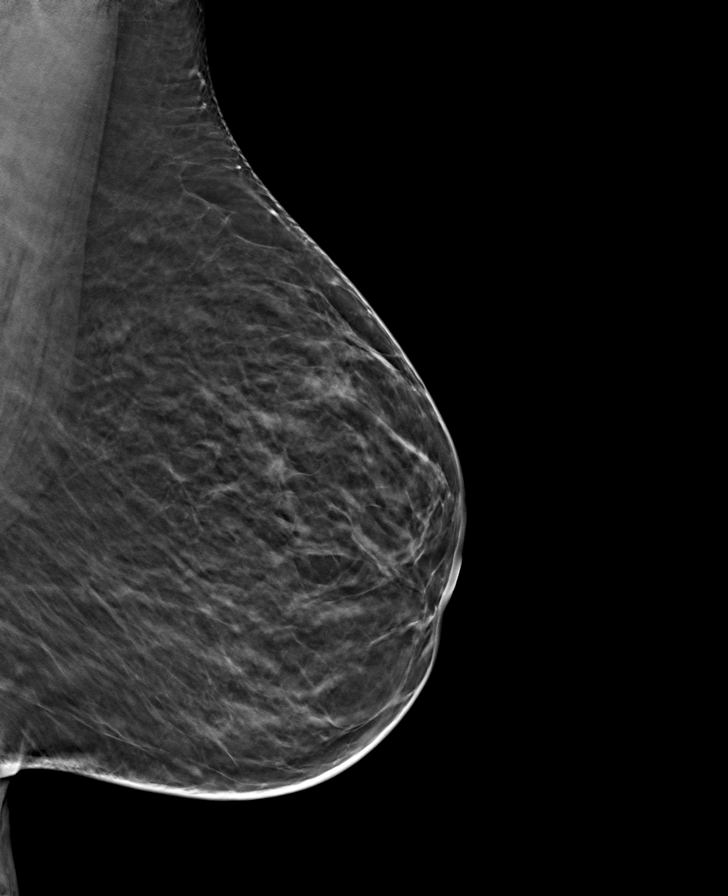

[L CC tomo · tomo slice 29/57.0]
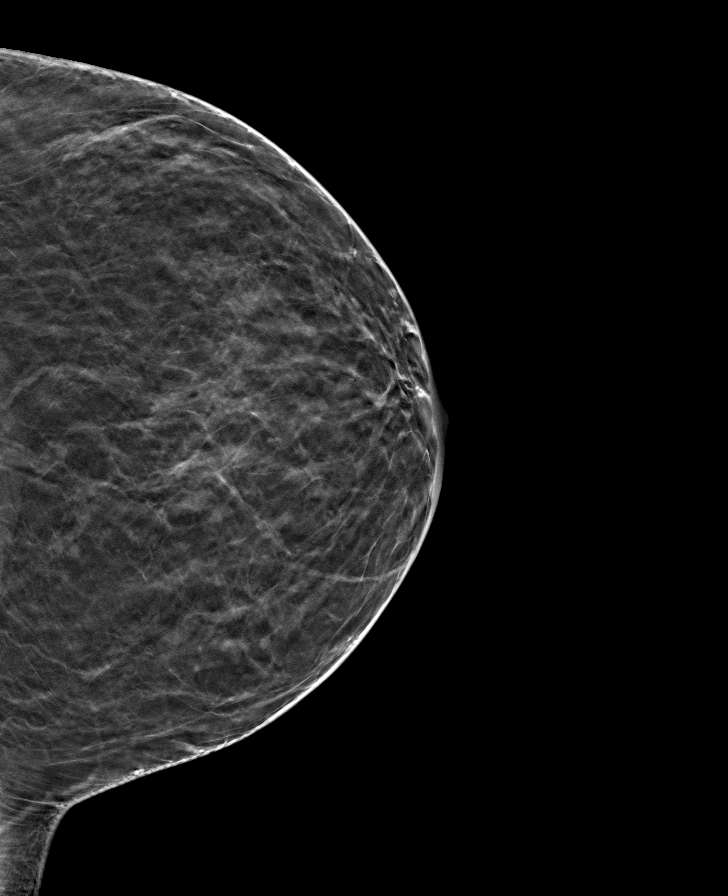

[8 of 24 positions shown; findings below may reference images not displayed]

ACR Breast Density Category b: There are scattered areas of
fibroglandular density.
FINDINGS: There are no findings suspicious for malignancy.
IMPRESSION: No mammographic evidence of malignancy. A result letter of this
screening mammogram will be mailed directly to the patient.

RECOMMENDATION:
Screening mammogram in one year. (Code:51-O-LD2)

BI-RADS CATEGORY  1: Negative.

## 2022-02-27 ENCOUNTER — Other Ambulatory Visit: Payer: Self-pay

## 2022-02-27 DIAGNOSIS — E611 Iron deficiency: Secondary | ICD-10-CM

## 2022-02-28 ENCOUNTER — Encounter: Payer: Self-pay | Admitting: Internal Medicine

## 2022-02-28 ENCOUNTER — Inpatient Hospital Stay (HOSPITAL_BASED_OUTPATIENT_CLINIC_OR_DEPARTMENT_OTHER): Payer: BC Managed Care – PPO | Admitting: Internal Medicine

## 2022-02-28 ENCOUNTER — Inpatient Hospital Stay: Payer: BC Managed Care – PPO | Attending: Internal Medicine

## 2022-02-28 ENCOUNTER — Inpatient Hospital Stay: Payer: BC Managed Care – PPO

## 2022-02-28 VITALS — BP 129/83 | HR 83 | Temp 97.1°F | Resp 18

## 2022-02-28 VITALS — BP 123/74 | HR 88 | Temp 99.0°F | Ht 67.0 in | Wt 225.8 lb

## 2022-02-28 DIAGNOSIS — D509 Iron deficiency anemia, unspecified: Secondary | ICD-10-CM

## 2022-02-28 DIAGNOSIS — E611 Iron deficiency: Secondary | ICD-10-CM

## 2022-02-28 LAB — BASIC METABOLIC PANEL
Anion gap: 4 — ABNORMAL LOW (ref 5–15)
BUN: 18 mg/dL (ref 6–20)
CO2: 26 mmol/L (ref 22–32)
Calcium: 8.6 mg/dL — ABNORMAL LOW (ref 8.9–10.3)
Chloride: 106 mmol/L (ref 98–111)
Creatinine, Ser: 0.82 mg/dL (ref 0.44–1.00)
GFR, Estimated: >60 mL/min (ref >60)
Glucose, Bld: 100 mg/dL — ABNORMAL HIGH (ref 70–99)
Potassium: 4 mmol/L (ref 3.5–5.1)
Sodium: 136 mmol/L (ref 135–145)

## 2022-02-28 LAB — IRON AND TIBC
Iron: 32 ug/dL (ref 28–170)
Saturation Ratios: 8 % — ABNORMAL LOW (ref 10.4–31.8)
TIBC: 427 ug/dL (ref 250–450)
UIBC: 395 ug/dL

## 2022-02-28 LAB — CBC WITH DIFFERENTIAL/PLATELET
Abs Immature Granulocytes: 0 10*3/uL (ref 0.00–0.07)
Basophils Absolute: 0 10*3/uL (ref 0.0–0.1)
Basophils Relative: 0 %
Eosinophils Absolute: 0.1 10*3/uL (ref 0.0–0.5)
Eosinophils Relative: 2 %
HCT: 36.5 % (ref 36.0–46.0)
Hemoglobin: 11.9 g/dL — ABNORMAL LOW (ref 12.0–15.0)
Immature Granulocytes: 0 %
Lymphocytes Relative: 32 %
Lymphs Abs: 1.7 10*3/uL (ref 0.7–4.0)
MCH: 29.9 pg (ref 26.0–34.0)
MCHC: 32.6 g/dL (ref 30.0–36.0)
MCV: 91.7 fL (ref 80.0–100.0)
Monocytes Absolute: 0.5 10*3/uL (ref 0.1–1.0)
Monocytes Relative: 9 %
Neutro Abs: 2.9 10*3/uL (ref 1.7–7.7)
Neutrophils Relative %: 57 %
Platelets: 208 10*3/uL (ref 150–400)
RBC: 3.98 MIL/uL (ref 3.87–5.11)
RDW: 12.6 % (ref 11.5–15.5)
WBC: 5.1 10*3/uL (ref 4.0–10.5)
nRBC: 0 % (ref 0.0–0.2)

## 2022-02-28 LAB — FERRITIN: Ferritin: 5 ng/mL — ABNORMAL LOW (ref 11–307)

## 2022-02-28 MED ORDER — SODIUM CHLORIDE 0.9 % IV SOLN: 200.0000 mg | Freq: Once | INTRAVENOUS | Status: DC

## 2022-02-28 MED ORDER — IRON SUCROSE 20 MG/ML IV SOLN
200.0000 mg | Freq: Once | INTRAVENOUS | Status: AC
  Administered 2022-02-28: 200 mg via INTRAVENOUS

## 2022-02-28 MED ORDER — SODIUM CHLORIDE 0.9 % IV SOLN
Freq: Once | INTRAVENOUS | Status: AC
  Filled 2022-02-28: qty 250

## 2022-02-28 NOTE — Patient Instructions (Signed)

## 2022-02-28 NOTE — Progress Notes (Signed)
[facility] ?CONSULT NOTE ? ?Patient Care Team: ?Dale Guide Rock, MD as PCP - General (Internal Medicine) ? ?CHIEF COMPLAINTS/PURPOSE OF CONSULTATION:  ? ? ?HEMATOLOGY HISTORY ? ?# IRON DEF ANEMIA -malabsorption /heavy menstrual cycles. nadir 6.6; iron sat 2%; ferritin-4 [April 2021];  IV IRON / ferrahem x4 [last June 2021; [facility]]; AUG 2021- B12->1000  ? 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 ?#Chronic fatigue- Dx: OSA; intolerant to CPAP; 2022-sleep study negative.  ? ? ?HISTORY OF PRESENTING ILLNESS: Alone.  Ambulating independently. ? ?Erica Hernandez 46 y.o.  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 ? ?Dad passed away yesterday.  Patient is grieving. ? ?Patient complains of ongoing fatigue.  No nausea vomiting.  Patient denies any blood in stools or black-colored stools.  Patient denies any nausea vomiting abdominal pain. ? ? ?Review of Systems  ?Constitutional:  Positive for malaise/fatigue and weight loss (Intentional/gastric bypass). Negative for chills, diaphoresis and fever.  ?HENT:  Negative for nosebleeds and sore throat.   ?Eyes:  Negative for double vision.  ?Respiratory:  Negative for cough, hemoptysis, sputum production, shortness of breath and wheezing.   ?Cardiovascular:  Negative for chest pain, palpitations, orthopnea and leg swelling.  ?Gastrointestinal:  Negative for abdominal pain, blood in stool, constipation, diarrhea, heartburn, melena, nausea and vomiting.  ?Genitourinary:  Negative for dysuria, frequency and urgency.  ?Musculoskeletal:  Negative for back pain and joint pain.  ?Skin: Negative.  Negative for itching and rash.  ?Neurological:  Negative for dizziness, tingling, focal weakness, weakness and headaches.  ?Endo/Heme/Allergies:  Does not bruise/bleed easily.  ?Psychiatric/Behavioral:  Negative for depression. The patient is not nervous/anxious and does not have insomnia.    ? ?MEDICAL HISTORY:  ?Past Medical History:  ?Diagnosis Date  ? Hypertension   ? during pregnancy  ? ? ?SURGICAL HISTORY: ?Past Surgical History:  ?Procedure Laterality Date  ? HERNIA REPAIR    ? SHOULDER SURGERY  03/30/11  ? TONSILLECTOMY  1984  ? ? ?SOCIAL HISTORY: ?Social History  ? ?Socioeconomic History  ? Marital status: Married  ?  Spouse name: Not on file  ? Number of children: 1  ? Years of education: Not on file  ? Highest education level: Not on file  ?Occupational History  ?  Employer: [employer]  ?Tobacco Use  ? Smoking status: Never  ? Smokeless tobacco: Never  ?Vaping Use  ? Vaping Use: Never used  ?Substance and Sexual Activity  ? Alcohol use: No  ?  Alcohol/week: 0.0 standard drinks  ? Drug use: No  ? Sexual activity: Not on file  ?Other Topics Concern  ? Not on file  ?Social History Narrative  ? Lives in [place]; 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   ? ?Social Determinants of Health  ? ?Financial Resource Strain: Not on file  ?Food Insecurity: Not on file  ?Transportation Needs: Not on file  ?Physical Activity: Not on file  ?Stress: Not on file  ?Social Connections: Not on file  ?Intimate Partner Violence: Not on file  ? ? ?FAMILY HISTORY: ?Family History  ?Problem Relation Age of Onset  ? Hypertension Father   ? Breast cancer Maternal Grandmother   ?     60-70  ? Breast cancer Paternal Grandmother   ?     60-70  ? Diabetes Other   ?     parent  ? Colon cancer Neg Hx   ? ? ?ALLERGIES:  is allergic to  sulfa antibiotics. ? ?MEDICATIONS:  ?Current Outpatient Medications  ?Medication Sig Dispense Refill  ? Biotin 10 MG CAPS Take by mouth.    ? Calcium Carbonate-Vitamin D3 600-400 MG-UNIT TABS Take 1 mg by mouth daily.    ? ciclopirox (PENLAC) 8 % solution Apply topically at bedtime. Apply over nail and surrounding skin. Apply daily over previous coat. After seven (7) days, may remove with alcohol and continue cycle. 6.6 mL 1  ? [employer] FIBER GUMMIES PO Take by mouth.    ? pantoprazole  (PROTONIX) 40 MG tablet TAKE 1 TABLET (40 MG TOTAL) BY MOUTH AT BEDTIME. 30 MINUTES BEFORE DINNER 90 tablet 3  ? tranexamic acid (LYSTEDA) 650 MG TABS tablet TAKE 2 TABLETS (1,300 MG TOTAL) BY MOUTH 3 (THREE) TIMES DAILY. 30 tablet 3  ? ?No current facility-administered medications for this visit.  ? ? ? ? ?PHYSICAL EXAMINATION: ? ? ?Vitals:  ? 02/28/22 1300  ?BP: 123/74  ?Pulse: 88  ?Temp: 99 ?F (37.2 ?C)  ?SpO2: 99%  ? ?Filed Weights  ? 02/28/22 1300  ?Weight: 225 lb 12.8 oz (102.4 kg)  ? ? ?Physical Exam ?HENT:  ?   Head: Normocephalic and atraumatic.  ?   Mouth/Throat:  ?   Pharynx: No oropharyngeal exudate.  ?Eyes:  ?   Pupils: Pupils are equal, round, and reactive to light.  ?Cardiovascular:  ?   Rate and Rhythm: Normal rate and regular rhythm.  ?Pulmonary:  ?   Effort: Pulmonary effort is normal. No respiratory distress.  ?   Breath sounds: Normal breath sounds. No wheezing.  ?Abdominal:  ?   General: Bowel sounds are normal. There is no distension.  ?   Palpations: Abdomen is soft. There is no mass.  ?   Tenderness: There is no abdominal tenderness. There is no guarding or rebound.  ?Musculoskeletal:     ?   General: No tenderness. Normal range of motion.  ?   Cervical back: Normal range of motion and neck supple.  ?Skin: ?   General: Skin is warm.  ?Neurological:  ?   Mental Status: She is alert and oriented to person, place, and time.  ?Psychiatric:     ?   Mood and Affect: Affect normal.  ? ? ?LABORATORY DATA:  ?I have reviewed the data as listed ?Lab Results  ?Component Value Date  ? WBC 5.1 02/28/2022  ? HGB 11.9 (L) 02/28/2022  ? HCT 36.5 02/28/2022  ? MCV 91.7 02/28/2022  ? PLT 208 02/28/2022  ? ?Recent Labs  ?  05/01/21 ?1308 08/30/21 ?1245 09/17/21 ?0916 10/11/21 ?0857 02/28/22 ?1238  ?NA 137 136 139  --  136  ?K 3.8 3.8 4.4  --  4.0  ?CL 103 104 106  --  106  ?CO2 27 26 28   --  26  ?GLUCOSE 104* 82 79  --  100*  ?BUN 15 17 18   --  18  ?CREATININE 0.79 0.75 0.71  --  0.82  ?CALCIUM 9.0 8.8* 8.9  --   8.6*  ?GFRNONAA >60 >60  --   --  >60  ?PROT  --   --  6.4 6.4  --   ?ALBUMIN  --   --  4.2 4.1  --   ?AST  --   --  30 31  --   ?ALT  --   --  48* 45*  --   ?ALKPHOS  --   --  87 87  --   ?BILITOT  --   --  0.4 0.3  --   ?BILIDIR  --   --   --  0.0  --   ? ? ? ?No results found. ? ?Iron deficiency ?#Iron deficiency anemia-multifactorial/malabsorption-bariatric surgery; heavy menstrual cycles.  .  S/p IV iron. Symptomatic/fatigue iron studies-P today.   Discussed re: barimelts/not started yet.  ? ? # Today hemoglobin is 11.9; proceed with Venofer x1. ? ?# DISPOSITION: ?# venofer ?# follow up in 6 months- MD; labs- cbc/bmp;ironstudies/ferritin- possible venoferDr.B ? ?All questions were answered. The patient knows to call the clinic with any problems, questions or concerns. ? ? Earna Coder, MD ?02/28/2022 1:49 PM ? ? ?

## 2022-02-28 NOTE — Assessment & Plan Note (Addendum)
#  Iron deficiency anemia-multifactorial/malabsorption-bariatric surgery; heavy menstrual cycles.  .  S/p IV iron. Symptomatic/fatigue iron studies-P today.   Discussed re: barimelts/not started yet.  ? ? # Today hemoglobin is 11.9; proceed with Venofer x1. ? ?# DISPOSITION: ?# venofer ?# follow up in 6 months- MD; labs- cbc/bmp;ironstudies/ferritin- possible venoferDr.B ? ?

## 2022-02-28 NOTE — Progress Notes (Signed)
Patient tolerated Venofer infusion well, no concerns voiced. Patient stable at discharge. AVS given.   ?

## 2022-02-28 NOTE — Progress Notes (Signed)
Dad passed away yesterday. ?

## 2022-03-03 ENCOUNTER — Encounter: Payer: Self-pay | Admitting: Internal Medicine

## 2022-03-03 ENCOUNTER — Ambulatory Visit (INDEPENDENT_AMBULATORY_CARE_PROVIDER_SITE_OTHER): Payer: BC Managed Care – PPO | Admitting: Internal Medicine

## 2022-03-03 ENCOUNTER — Telehealth: Payer: Self-pay

## 2022-03-03 VITALS — BP 128/80 | HR 74 | Temp 97.9°F | Resp 16 | Ht 67.0 in | Wt 223.6 lb

## 2022-03-03 DIAGNOSIS — R7989 Other specified abnormal findings of blood chemistry: Secondary | ICD-10-CM

## 2022-03-03 DIAGNOSIS — K219 Gastro-esophageal reflux disease without esophagitis: Secondary | ICD-10-CM

## 2022-03-03 DIAGNOSIS — Z13220 Encounter for screening for lipoid disorders: Secondary | ICD-10-CM

## 2022-03-03 DIAGNOSIS — D509 Iron deficiency anemia, unspecified: Secondary | ICD-10-CM | POA: Diagnosis not present

## 2022-03-03 DIAGNOSIS — Z1231 Encounter for screening mammogram for malignant neoplasm of breast: Secondary | ICD-10-CM | POA: Diagnosis not present

## 2022-03-03 DIAGNOSIS — F439 Reaction to severe stress, unspecified: Secondary | ICD-10-CM

## 2022-03-03 DIAGNOSIS — Z0000 Encounter for general adult medical examination without abnormal findings: Secondary | ICD-10-CM

## 2022-03-03 DIAGNOSIS — N926 Irregular menstruation, unspecified: Secondary | ICD-10-CM

## 2022-03-03 DIAGNOSIS — Z9884 Bariatric surgery status: Secondary | ICD-10-CM

## 2022-03-03 DIAGNOSIS — I10 Essential (primary) hypertension: Secondary | ICD-10-CM

## 2022-03-03 DIAGNOSIS — K760 Fatty (change of) liver, not elsewhere classified: Secondary | ICD-10-CM

## 2022-03-03 DIAGNOSIS — E611 Iron deficiency: Secondary | ICD-10-CM

## 2022-03-03 LAB — B12 AND FOLATE PANEL
Folate: 19.9 ng/mL (ref >5.9)
Vitamin B-12: >1504 pg/mL — ABNORMAL HIGH (ref 211–911)

## 2022-03-03 LAB — HEPATIC FUNCTION PANEL
ALT: 63 U/L — ABNORMAL HIGH (ref 0–35)
AST: 40 U/L — ABNORMAL HIGH (ref 0–37)
Albumin: 3.8 g/dL (ref 3.5–5.2)
Alkaline Phosphatase: 111 U/L (ref 39–117)
Bilirubin, Direct: 0.1 mg/dL (ref 0.0–0.3)
Total Bilirubin: 0.5 mg/dL (ref 0.2–1.2)
Total Protein: 6.2 g/dL (ref 6.0–8.3)

## 2022-03-03 LAB — LIPID PANEL
Cholesterol: 117 mg/dL (ref 0–200)
HDL: 48.9 mg/dL (ref >39.00)
LDL Cholesterol: 58 mg/dL (ref 0–99)
NonHDL: 68.11
Total CHOL/HDL Ratio: 2
Triglycerides: 50 mg/dL (ref 0.0–149.0)
VLDL: 10 mg/dL (ref 0.0–40.0)

## 2022-03-03 LAB — VITAMIN D 25 HYDROXY (VIT D DEFICIENCY, FRACTURES): VITD: 48.38 ng/mL (ref 30.00–100.00)

## 2022-03-03 LAB — TSH: TSH: 1.97 u[IU]/mL (ref 0.35–5.50)

## 2022-03-03 LAB — MAGNESIUM: Magnesium: 2.1 mg/dL (ref 1.5–2.5)

## 2022-03-03 LAB — HEMOGLOBIN A1C: Hgb A1c MFr Bld: 5 % (ref 4.6–6.5)

## 2022-03-03 NOTE — Progress Notes (Signed)
Patient ID: Erica Hernandez, female   DOB: 28-Dec-1975, 46 y.o.   MRN: [id number] ? ? ?Subjective:  ? ? Patient ID: Erica Hernandez, female    DOB: 22-May-1976, 46 y.o.   MRN: [id number] ? ?This visit occurred during the SARS-CoV-2 public health emergency.  Safety protocols were in place, including screening questions prior to the visit, additional usage of staff PPE, and extensive cleaning of exam room while observing appropriate contact time as indicated for disinfecting solutions.  ? ?Patient here for her physical exam.  ? ?Chief Complaint  ?Patient presents with  ? Annual Exam  ?  CPE no pap  ? .  ? ?HPI ?Increased stress.  Father recently passed.  She has good support.  Does not feel needs any further intervention.  No chest pain or sob reported.  No abdominal pain or bowel change reported.  Swallowing ok.  Needs labs for her bariatric physician drawn today.  Received iron infusion yesterday.  Some fatigue, but overall doing ok.  ? ? ?Past Medical History:  ?Diagnosis Date  ? Hypertension   ? during pregnancy  ? ?Past Surgical History:  ?Procedure Laterality Date  ? HERNIA REPAIR    ? SHOULDER SURGERY  03/30/11  ? TONSILLECTOMY  1984  ? ?Family History  ?Problem Relation Age of Onset  ? Hypertension Father   ? Breast cancer Maternal Grandmother   ?     60-70  ? Breast cancer Paternal Grandmother   ?     60-70  ? Diabetes Other   ?     parent  ? Colon cancer Neg Hx   ? ?Social History  ? ?Socioeconomic History  ? Marital status: Married  ?  Spouse name: Not on file  ? Number of children: 1  ? Years of education: Not on file  ? Highest education level: Not on file  ?Occupational History  ?  Employer: [employer]  ?Tobacco Use  ? Smoking status: Never  ? Smokeless tobacco: Never  ?Vaping Use  ? Vaping Use: Never used  ?Substance and Sexual Activity  ? Alcohol use: No  ?  Alcohol/week: 0.0 standard drinks  ? Drug use: No  ? Sexual activity: Not on file  ?Other Topics Concern  ? Not on file  ?Social History Narrative  ?  Lives in [place]; 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   ? ?Social Determinants of Health  ? ?Financial Resource Strain: Not on file  ?Food Insecurity: Not on file  ?Transportation Needs: Not on file  ?Physical Activity: Not on file  ?Stress: Not on file  ?Social Connections: Not on file  ? ? ? ?Review of Systems  ?Constitutional:  Positive for fatigue. Negative for appetite change and unexpected weight change.  ?HENT:  Negative for congestion, sinus pressure and sore throat.   ?Eyes:  Negative for pain and visual disturbance.  ?Respiratory:  Negative for cough, chest tightness and shortness of breath.   ?Cardiovascular:  Negative for chest pain, palpitations and leg swelling.  ?Gastrointestinal:  Negative for abdominal pain, diarrhea, nausea and vomiting.  ?Genitourinary:  Negative for difficulty urinating and dysuria.  ?Musculoskeletal:  Negative for joint swelling and myalgias.  ?Skin:  Negative for color change and rash.  ?Neurological:  Negative for dizziness, light-headedness and headaches.  ?Hematological:  Negative for adenopathy. Does not bruise/bleed easily.  ?Psychiatric/Behavioral:  Negative for agitation and dysphoric mood.   ? ?   ?Objective:  ?  ? ?BP 128/80 (BP Location: Left Arm, Patient  Position: Sitting, Cuff Size: Large)   Pulse 74   Temp 97.9 ?F (36.6 ?C) (Temporal)   Resp 16   Ht 5\' 7"  (1.702 m)   Wt 223 lb 9.6 oz (101.4 kg)   SpO2 98%   BMI 35.02 kg/m?  ?Wt Readings from Last 3 Encounters:  ?03/03/22 223 lb 9.6 oz (101.4 kg)  ?02/28/22 225 lb 12.8 oz (102.4 kg)  ?09/17/21 221 lb (100.2 kg)  ? ? ?Physical Exam ?Vitals reviewed.  ?Constitutional:   ?   General: She is not in acute distress. ?   Appearance: Normal appearance. She is well-developed.  ?HENT:  ?   Head: Normocephalic and atraumatic.  ?   Right Ear: External ear normal.  ?   Left Ear: External ear normal.  ?Eyes:  ?   General: No scleral icterus.    ?   Right eye: No discharge.     ?   Left eye: No discharge.  ?    Conjunctiva/sclera: Conjunctivae normal.  ?Neck:  ?   Thyroid: No thyromegaly.  ?Cardiovascular:  ?   Rate and Rhythm: Normal rate and regular rhythm.  ?Pulmonary:  ?   Effort: No tachypnea, accessory muscle usage or respiratory distress.  ?   Breath sounds: Normal breath sounds. No decreased breath sounds or wheezing.  ?Chest:  ?Breasts: ?   Right: No inverted nipple, mass, nipple discharge or tenderness (no axillary adenopathy).  ?   Left: No inverted nipple, mass, nipple discharge or tenderness (no axilarry adenopathy).  ?Abdominal:  ?   General: Bowel sounds are normal.  ?   Palpations: Abdomen is soft.  ?   Tenderness: There is no abdominal tenderness.  ?Musculoskeletal:     ?   General: No swelling or tenderness.  ?   Cervical back: Neck supple.  ?Lymphadenopathy:  ?   Cervical: No cervical adenopathy.  ?Skin: ?   Findings: No erythema or rash.  ?Neurological:  ?   Mental Status: She is alert and oriented to person, place, and time.  ?Psychiatric:     ?   Mood and Affect: Mood normal.     ?   Behavior: Behavior normal.  ? ? ? ?Outpatient Encounter Medications as of 03/03/2022  ?Medication Sig  ? Biotin 10 MG CAPS Take by mouth.  ? Calcium Carbonate-Vitamin D3 600-400 MG-UNIT TABS Take 1 mg by mouth daily.  ? ciclopirox (PENLAC) 8 % solution Apply topically at bedtime. Apply over nail and surrounding skin. Apply daily over previous coat. After seven (7) days, may remove with alcohol and continue cycle.  ? [employer] FIBER GUMMIES PO Take by mouth.  ? pantoprazole (PROTONIX) 40 MG tablet TAKE 1 TABLET (40 MG TOTAL) BY MOUTH AT BEDTIME. 30 MINUTES BEFORE DINNER  ? tranexamic acid (LYSTEDA) 650 MG TABS tablet TAKE 2 TABLETS (1,300 MG TOTAL) BY MOUTH 3 (THREE) TIMES DAILY.  ? [DISCONTINUED] norethindrone (MICRONOR) 0.35 MG tablet Take 1 tablet (0.35 mg total) by mouth daily.  ? ?No facility-administered encounter medications on file as of 03/03/2022.  ?  ? ?Lab Results  ?Component Value Date  ? WBC 5.1 02/28/2022  ? HGB  11.9 (L) 02/28/2022  ? HCT 36.5 02/28/2022  ? PLT 208 02/28/2022  ? GLUCOSE 100 (H) 02/28/2022  ? CHOL 117 03/03/2022  ? TRIG 50.0 03/03/2022  ? HDL 48.90 03/03/2022  ? LDLCALC 58 03/03/2022  ? ALT 63 (H) 03/03/2022  ? AST 40 (H) 03/03/2022  ? NA 136 02/28/2022  ? K 4.0  02/28/2022  ? CL 106 02/28/2022  ? CREATININE 0.82 02/28/2022  ? BUN 18 02/28/2022  ? CO2 26 02/28/2022  ? TSH 1.97 03/03/2022  ? HGBA1C 5.0 03/03/2022  ? ? ?DG Abd 2 Views ? ?Result Date: 08/18/2021 ?CLINICAL DATA:  Upper right abdomen pain since last night. EXAM: ABDOMEN - 2 VIEW COMPARISON:  None. FINDINGS: The bowel gas pattern is normal. Extensive bowel content is identified throughout colon. There is no evidence of free air. No radio-opaque calculi or other significant radiographic abnormality is seen. IMPRESSION: No bowel obstruction. Constipation. Electronically Signed   By: Sherian ReinWei-Chen  Lin M.D.   On: 08/18/2021 08:54  ? ? ?   ?Assessment & Plan:  ? ?Problem List Items Addressed This Visit   ? ? Anemia  ?  History of bariatric surgery.   Seeing Dr. Donneta RombergBrahmanday.  Has received IV iron.  Follow cbc and iron studies.  ? ?  ?  ? Fatty liver  ?  Has lost weight.  Discussed diet and exercise.  Follow liver function tests. ? ?  ?  ? Gastroesophageal reflux disease without esophagitis  ?  She is status post duodenal switch.  Is status post hiatal hernia repair February 2022.  Followed by Dr. Smitty CordsBruce.  No swallowing problems now.  Continues on Protonix.  No acid reflux reported. ? ?  ?  ? Health care maintenance  ?  Physical today 03/03/22.  PAP 08/29/19 - negative with negative HPV.  Mammogram 08/02/21 - Birads I.   ? ?  ?  ? History of bariatric surgery  ?  Brings list of labs needed for her bariatric surgeon.  Request to get drawn here today.   ? ?  ?  ? Relevant Orders  ? Magnesium (Completed)  ? Vitamin B1  ? Vitamin E  ? Zinc (Completed)  ? PTH, intact (no Ca) (Completed)  ? Lipid Profile (Completed)  ? Vitamin D (25 hydroxy) (Completed)  ? TSH  (Completed)  ? Vitamin A (Completed)  ? Copper, Blood (Completed)  ? Vitamin K1, Serum  ? HgB A1c (Completed)  ? B12 and Folate Panel (Completed)  ? Hepatic function panel (Completed)  ? Hypertension, essential  ?  Blood pr

## 2022-03-03 NOTE — Assessment & Plan Note (Signed)
Physical today 03/03/22.  PAP 08/29/19 - negative with negative HPV.  Mammogram 08/02/21 - Birads I.   ?

## 2022-03-03 NOTE — Telephone Encounter (Signed)
Lm for pt re: Mammo appointment 07/31/22 at 4pm. ?Pt is ok with message being left on voicemail. ?

## 2022-03-05 ENCOUNTER — Other Ambulatory Visit: Payer: Self-pay

## 2022-03-05 DIAGNOSIS — R7989 Other specified abnormal findings of blood chemistry: Secondary | ICD-10-CM

## 2022-03-09 ENCOUNTER — Encounter: Payer: Self-pay | Admitting: Internal Medicine

## 2022-03-09 NOTE — Assessment & Plan Note (Signed)
History of bariatric surgery.   Seeing Dr. Donneta Romberg.  Has received IV iron.  Follow cbc and iron studies.  ?

## 2022-03-09 NOTE — Assessment & Plan Note (Signed)
Blood pressure doing well on no medication.  Follow.   

## 2022-03-09 NOTE — Assessment & Plan Note (Signed)
Has lost weight.  Discussed diet and exercise.  Follow liver function tests. 

## 2022-03-09 NOTE — Assessment & Plan Note (Signed)
She is status post duodenal switch.  Is status post hiatal hernia repair February 2022.  Followed by Dr. Smitty Cords.  No swallowing problems now.  Continues on Protonix.  No acid reflux reported. ?

## 2022-03-09 NOTE — Assessment & Plan Note (Signed)
Brings list of labs needed for her bariatric surgeon.  Request to get drawn here today.   ?

## 2022-03-09 NOTE — Assessment & Plan Note (Signed)
Increased stress as outlined.  Discussed.  Has good support.  Does not feel needs any further intervention.  Follow.  

## 2022-03-11 ENCOUNTER — Ambulatory Visit
Admission: RE | Admit: 2022-03-11 | Discharge: 2022-03-11 | Disposition: A | Discharge status: Home / Self Care | Payer: BC Managed Care – PPO | Source: Ambulatory Visit | Attending: Internal Medicine | Admitting: Internal Medicine

## 2022-03-11 DIAGNOSIS — R7989 Other specified abnormal findings of blood chemistry: Secondary | ICD-10-CM | POA: Diagnosis not present

## 2022-03-11 DIAGNOSIS — Z9049 Acquired absence of other specified parts of digestive tract: Secondary | ICD-10-CM | POA: Diagnosis not present

## 2022-03-13 LAB — EXTRA SPECIMEN

## 2022-03-13 LAB — COPPER, BLOOD

## 2022-03-13 LAB — VITAMIN B1: Vitamin B1 (Thiamine): 22 nmol/L (ref 8–30)

## 2022-03-13 LAB — VITAMIN E
Gamma-Tocopherol (Vit E): <1 mg/L (ref <=4.3)
Vitamin E (Alpha Tocopherol): 6.7 mg/L (ref 5.7–19.9)

## 2022-03-13 LAB — PARATHYROID HORMONE, INTACT (NO CA): PTH: 18 pg/mL (ref 16–77)

## 2022-03-13 LAB — ZINC

## 2022-03-13 LAB — VITAMIN K1, SERUM: Vitamin K: 137 pg/mL (ref 130–1500)

## 2022-03-13 LAB — VITAMIN A: Vitamin A (Retinoic Acid): 34 ug/dL — ABNORMAL LOW (ref 38–98)

## 2022-03-24 ENCOUNTER — Other Ambulatory Visit (INDEPENDENT_AMBULATORY_CARE_PROVIDER_SITE_OTHER): Payer: BC Managed Care – PPO

## 2022-03-24 DIAGNOSIS — R7989 Other specified abnormal findings of blood chemistry: Secondary | ICD-10-CM

## 2022-03-24 LAB — HEPATIC FUNCTION PANEL
ALT: 58 U/L — ABNORMAL HIGH (ref 0–35)
AST: 35 U/L (ref 0–37)
Albumin: 3.8 g/dL (ref 3.5–5.2)
Alkaline Phosphatase: 89 U/L (ref 39–117)
Bilirubin, Direct: 0 mg/dL (ref 0.0–0.3)
Total Bilirubin: 0.4 mg/dL (ref 0.2–1.2)
Total Protein: 6.2 g/dL (ref 6.0–8.3)

## 2022-03-31 ENCOUNTER — Other Ambulatory Visit: Payer: Self-pay

## 2022-03-31 ENCOUNTER — Encounter: Payer: Self-pay | Admitting: Internal Medicine

## 2022-03-31 DIAGNOSIS — R7989 Other specified abnormal findings of blood chemistry: Secondary | ICD-10-CM

## 2022-04-30 ENCOUNTER — Telehealth: Payer: Self-pay | Admitting: Internal Medicine

## 2022-04-30 NOTE — Telephone Encounter (Signed)
Agree with bland foods and staying hydrated.  Need to confirm if she has been on abx recently.  Iif so, let me know. Recommend kaopectate for loose stool.  Also, recommend probiotic daily.  If persistent, needs to be evaluated.

## 2022-04-30 NOTE — Telephone Encounter (Signed)
S/w pt - stated having diarrhea since Sunday night. Diarrhea accompanied with nausea and stomach pain. States only happens in morning hours starting at 12am, by early evening she is fine. No diarrhea or pain or nausea. Unsure what has caused it, has not eaten anything out of the ordinary. Has been staying hydrated, and eating bland food to try to control diarrhea. Nervous to take anything due to weight loss surgery. Does not want to interfere with it.   How should pt proceed ?

## 2022-04-30 NOTE — Telephone Encounter (Signed)
Pt advised and gave verbal understanding. Will call if not getting better by Friday.

## 2022-04-30 NOTE — Telephone Encounter (Signed)
Pt called stating she has had diarrhea since Sunday and want to know if there is anything she can take

## 2022-06-11 ENCOUNTER — Encounter: Payer: Self-pay | Admitting: Internal Medicine

## 2022-06-11 ENCOUNTER — Other Ambulatory Visit: Payer: Self-pay

## 2022-06-11 MED ORDER — TRANEXAMIC ACID 650 MG PO TABS
1300.0000 mg | ORAL_TABLET | Freq: Three times a day (TID) | ORAL | 3 refills | Status: DC
Start: 2022-06-11 — End: 2023-05-27

## 2022-06-17 ENCOUNTER — Other Ambulatory Visit: Payer: Self-pay | Admitting: Internal Medicine

## 2022-06-17 ENCOUNTER — Telehealth: Payer: Self-pay | Admitting: Internal Medicine

## 2022-06-17 DIAGNOSIS — E611 Iron deficiency: Secondary | ICD-10-CM

## 2022-06-17 NOTE — Telephone Encounter (Signed)
MD would like patient scheduled for lab (iron labs)/APP/poss Venofer.  Please schedule and inform pt of appt details

## 2022-06-17 NOTE — Addendum Note (Signed)
Addended by: Guerry Minors on: 06/17/2022 01:32 PM   Modules accepted: Orders

## 2022-06-17 NOTE — Telephone Encounter (Signed)
Pt states she feels bad, headache, dizzy, more fatigued than usual x2 days. Last seen 02/28/22, please advise.

## 2022-06-17 NOTE — Telephone Encounter (Signed)
Pt called in with Concerns of low iron levels, pt would like to have labs and iron today. I advised pt that someone from her MD team would be in contact with her.

## 2022-06-19 ENCOUNTER — Encounter: Payer: Self-pay | Admitting: Nurse Practitioner

## 2022-06-19 ENCOUNTER — Inpatient Hospital Stay (HOSPITAL_BASED_OUTPATIENT_CLINIC_OR_DEPARTMENT_OTHER): Payer: BC Managed Care – PPO | Admitting: Nurse Practitioner

## 2022-06-19 ENCOUNTER — Inpatient Hospital Stay: Payer: BC Managed Care – PPO | Attending: Nurse Practitioner

## 2022-06-19 ENCOUNTER — Inpatient Hospital Stay: Payer: BC Managed Care – PPO

## 2022-06-19 VITALS — BP 113/75 | HR 86 | Temp 98.6°F | Resp 16 | Wt 227.6 lb

## 2022-06-19 VITALS — BP 108/76 | HR 75 | Temp 98.0°F | Resp 17

## 2022-06-19 DIAGNOSIS — E611 Iron deficiency: Secondary | ICD-10-CM

## 2022-06-19 DIAGNOSIS — D509 Iron deficiency anemia, unspecified: Secondary | ICD-10-CM

## 2022-06-19 DIAGNOSIS — D508 Other iron deficiency anemias: Secondary | ICD-10-CM | POA: Diagnosis not present

## 2022-06-19 DIAGNOSIS — K909 Intestinal malabsorption, unspecified: Secondary | ICD-10-CM | POA: Diagnosis not present

## 2022-06-19 LAB — FERRITIN: Ferritin: 5 ng/mL — ABNORMAL LOW (ref 11–307)

## 2022-06-19 LAB — CBC WITH DIFFERENTIAL/PLATELET
Abs Immature Granulocytes: 0.01 10*3/uL (ref 0.00–0.07)
Basophils Absolute: 0 10*3/uL (ref 0.0–0.1)
Basophils Relative: 0 %
Eosinophils Absolute: 0.2 10*3/uL (ref 0.0–0.5)
Eosinophils Relative: 3 %
HCT: 35.9 % — ABNORMAL LOW (ref 36.0–46.0)
Hemoglobin: 11.2 g/dL — ABNORMAL LOW (ref 12.0–15.0)
Immature Granulocytes: 0 %
Lymphocytes Relative: 28 %
Lymphs Abs: 1.8 10*3/uL (ref 0.7–4.0)
MCH: 28.1 pg (ref 26.0–34.0)
MCHC: 31.2 g/dL (ref 30.0–36.0)
MCV: 90 fL (ref 80.0–100.0)
Monocytes Absolute: 0.5 10*3/uL (ref 0.1–1.0)
Monocytes Relative: 8 %
Neutro Abs: 3.9 10*3/uL (ref 1.7–7.7)
Neutrophils Relative %: 61 %
Platelets: 264 10*3/uL (ref 150–400)
RBC: 3.99 MIL/uL (ref 3.87–5.11)
RDW: 13.2 % (ref 11.5–15.5)
WBC: 6.4 10*3/uL (ref 4.0–10.5)
nRBC: 0 % (ref 0.0–0.2)

## 2022-06-19 LAB — IRON AND TIBC
Iron: 29 ug/dL (ref 28–170)
Saturation Ratios: 6 % — ABNORMAL LOW (ref 10.4–31.8)
TIBC: 456 ug/dL — ABNORMAL HIGH (ref 250–450)
UIBC: 427 ug/dL

## 2022-06-19 MED ORDER — SODIUM CHLORIDE 0.9 % IV SOLN
200.0000 mg | Freq: Once | INTRAVENOUS | Status: AC
  Administered 2022-06-19: 200 mg via INTRAVENOUS
  Filled 2022-06-19: qty 200

## 2022-06-19 MED ORDER — SODIUM CHLORIDE 0.9 % IV SOLN
Freq: Once | INTRAVENOUS | Status: AC
  Filled 2022-06-19: qty 250

## 2022-06-19 NOTE — Progress Notes (Signed)
[facility] CONSULT NOTE  Patient Care Team: Dale [place], MD as PCP - General (Internal Medicine)  CHIEF COMPLAINTS/PURPOSE OF CONSULTATION:    HEMATOLOGY HISTORY  # IRON DEF ANEMIA -malabsorption /heavy menstrual cycles. 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    HISTORY OF PRESENTING ILLNESS: Alone.  Ambulating independently.  Erica Hernandez 46 y.o.  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 Continues to report significant fatigue. She lost her father in April and continues to grieve. Denies black or bloody stools. No abdominal pain. Hx of gastric bypass.    Review of Systems  Constitutional:  Positive for malaise/fatigue. Negative for chills, diaphoresis, fever and weight loss.  HENT:  Negative for congestion, ear discharge, ear pain, nosebleeds, sinus pain and sore throat.   Eyes:  Negative for pain and redness.  Respiratory:  Negative for cough, hemoptysis, sputum production, shortness of breath, wheezing and stridor.   Cardiovascular:  Negative for chest pain, palpitations and leg swelling.  Gastrointestinal:  Negative for abdominal pain, constipation, diarrhea, nausea and vomiting.  Musculoskeletal:  Negative for falls, joint pain and myalgias.  Skin:  Negative for rash.  Neurological:  Negative for dizziness, loss of consciousness, weakness and headaches.  Psychiatric/Behavioral:  Negative for depression. The patient is not nervous/anxious and does not have insomnia.   All other systems reviewed and are negative.   MEDICAL HISTORY:  Past Medical History:  Diagnosis Date   Hypertension    during pregnancy    SURGICAL HISTORY: Past Surgical History:  Procedure Laterality  Date   HERNIA REPAIR     SHOULDER SURGERY  03/30/11   TONSILLECTOMY  1984    SOCIAL HISTORY: Social History   Socioeconomic History   Marital status: Married    Spouse name: Not on file   Number of children: 1   Years of education: Not on file   Highest education level: Not on file  Occupational History    Employer: [employer]  Tobacco Use   Smoking status: Never   Smokeless tobacco: Never  Vaping Use   Vaping Use: Never used  Substance and Sexual Activity   Alcohol use: No    Alcohol/week: 0.0 standard drinks of alcohol   Drug use: No   Sexual activity: Not on file  Other Topics Concern   Not on file  Social History Narrative   Lives in [place]; 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    Social Determinants of Health   Financial Resource Strain: Not on file  Food Insecurity: Not on file  Transportation Needs: Not on file  Physical Activity: Not on file  Stress: Not on file  Social Connections: Not on file  Intimate Partner Violence: Not on file    FAMILY HISTORY: Family History  Problem Relation Age of Onset   Hypertension Father    Breast cancer Maternal Grandmother        64-70   Breast cancer Paternal Grandmother        76-70   Diabetes Other        parent   Colon cancer Neg Hx     ALLERGIES:  is allergic to sulfa antibiotics.  MEDICATIONS:  Current Outpatient Medications  Medication Sig Dispense Refill   Biotin 10 MG  CAPS Take by mouth.     Calcium Carbonate-Vitamin D3 600-400 MG-UNIT TABS Take 1 mg by mouth daily.     ciclopirox (PENLAC) 8 % solution Apply topically at bedtime. Apply over nail and surrounding skin. Apply daily over previous coat. After seven (7) days, may remove with alcohol and continue cycle. 6.6 mL 1   [employer] FIBER GUMMIES PO Take by mouth.     pantoprazole (PROTONIX) 40 MG tablet TAKE 1 TABLET (40 MG TOTAL) BY MOUTH AT BEDTIME. 30 MINUTES BEFORE DINNER 90 tablet 3   tranexamic acid (LYSTEDA) 650 MG TABS tablet Take 2  tablets (1,300 mg total) by mouth 3 (three) times daily. 30 tablet 3   No current facility-administered medications for this visit.      PHYSICAL EXAMINATION:   Vitals:   06/19/22 1349  BP: 113/75  Pulse: 86  Resp: 16  Temp: 98.6 F (37 C)  SpO2: 98%   Filed Weights   06/19/22 1349  Weight: 227 lb 9.6 oz (103.2 kg)    Physical Exam HENT:     Head: Normocephalic and atraumatic.     Mouth/Throat:     Pharynx: No oropharyngeal exudate.  Eyes:     Pupils: Pupils are equal, round, and reactive to light.  Cardiovascular:     Rate and Rhythm: Normal rate and regular rhythm.  Pulmonary:     Effort: Pulmonary effort is normal. No respiratory distress.     Breath sounds: Normal breath sounds. No wheezing.  Abdominal:     General: Bowel sounds are normal. There is no distension.     Palpations: Abdomen is soft. There is no mass.     Tenderness: There is no abdominal tenderness. There is no guarding or rebound.  Musculoskeletal:        General: No tenderness. Normal range of motion.     Cervical back: Normal range of motion and neck supple.  Skin:    General: Skin is warm.  Neurological:     Mental Status: She is alert and oriented to person, place, and time.  Psychiatric:        Mood and Affect: Affect normal.     LABORATORY DATA:  I have reviewed the data as listed Lab Results  Component Value Date   WBC 6.4 06/19/2022   HGB 11.2 (L) 06/19/2022   HCT 35.9 (L) 06/19/2022   MCV 90.0 06/19/2022   PLT 264 06/19/2022   Recent Labs    08/30/21 1245 09/17/21 0916 09/17/21 0916 10/11/21 0857 02/28/22 1238 03/03/22 0814 03/24/22 0732  NA 136 139  --   --  136  --   --   K 3.8 4.4  --   --  4.0  --   --   CL 104 106  --   --  106  --   --   CO2 26 28  --   --  26  --   --   GLUCOSE 82 79  --   --  100*  --   --   BUN 17 18  --   --  18  --   --   CREATININE 0.75 0.71  --   --  0.82  --   --   CALCIUM 8.8* 8.9  --   --  8.6*  --   --   GFRNONAA >60  --   --    --  >60  --   --   PROT  --  6.4   < >  6.4  --  6.2 6.2  ALBUMIN  --  4.2   < > 4.1  --  3.8 3.8  AST  --  30   < > 31  --  40* 35  ALT  --  48*   < > 45*  --  63* 58*  ALKPHOS  --  87   < > 87  --  111 89  BILITOT  --  0.4   < > 0.3  --  0.5 0.4  BILIDIR  --   --   --  0.0  --  0.1 0.0   < > = values in this interval not displayed.   Iron/TIBC/Ferritin/ %Sat    Component Value Date/Time   IRON 29 06/19/2022 1317   TIBC 456 (H) 06/19/2022 1317   FERRITIN 5 (L) 06/19/2022 1317   IRONPCTSAT 6 (L) 06/19/2022 1317      No results found.   Assessment & Plan:  No problem-specific Assessment & Plan notes found for this encounter.  #Iron deficiency anemia-multifactorial/malabsorption-bariatric surgery; heavy menstrual cycles. S/p IV iron. Symptomatic/fatigue iron studies. Hemoglobin 11.2. Iron studies pending at time of visit. Ferritin 5, iron sat 6%. Plan for venofer x 5 - weekly to biweekly.   # b12 deficiency- check b12 at next visit given hx of gastric bypass.  # Etiology- she has abnormal LFTs and hx of IDA. She will see Dr. Servando Snare for evaluation and possible colonoscopy.     # DISPOSITION: Venofer x 5 (first today) 3 mo - lab (cbc, cmp, ferritin, iron studies, b12) Day to week later- see Dr. Donneta Romberg, +/- venofer  All questions were answered. The patient knows to call the clinic with any problems, questions or concerns.   Alinda Dooms, NP 06/19/2022

## 2022-06-19 NOTE — Progress Notes (Signed)
Pt in for follow up, reports still very fatigued and tired.

## 2022-06-20 ENCOUNTER — Encounter: Payer: Self-pay | Admitting: Nurse Practitioner

## 2022-06-25 ENCOUNTER — Encounter: Payer: Self-pay | Admitting: Nurse Practitioner

## 2022-06-26 ENCOUNTER — Encounter: Payer: Self-pay | Admitting: Internal Medicine

## 2022-06-26 NOTE — Telephone Encounter (Signed)
Pt informed of response from Consuello Masse, NP. Scheduling informed of need for appointments.

## 2022-06-27 ENCOUNTER — Inpatient Hospital Stay: Payer: BC Managed Care – PPO

## 2022-06-27 VITALS — BP 100/76 | HR 76 | Temp 97.2°F | Resp 17

## 2022-06-27 DIAGNOSIS — D509 Iron deficiency anemia, unspecified: Secondary | ICD-10-CM

## 2022-06-27 DIAGNOSIS — D508 Other iron deficiency anemias: Secondary | ICD-10-CM | POA: Diagnosis not present

## 2022-06-27 DIAGNOSIS — K909 Intestinal malabsorption, unspecified: Secondary | ICD-10-CM | POA: Diagnosis not present

## 2022-06-27 DIAGNOSIS — E611 Iron deficiency: Secondary | ICD-10-CM

## 2022-06-27 MED ORDER — SODIUM CHLORIDE 0.9 % IV SOLN
200.0000 mg | Freq: Once | INTRAVENOUS | Status: AC
  Administered 2022-06-27: 200 mg via INTRAVENOUS
  Filled 2022-06-27: qty 200

## 2022-06-27 MED ORDER — SODIUM CHLORIDE 0.9 % IV SOLN
Freq: Once | INTRAVENOUS | Status: AC
  Filled 2022-06-27: qty 250

## 2022-06-27 MED ORDER — SODIUM CHLORIDE 0.9% FLUSH
10.0000 mL | Freq: Once | INTRAVENOUS | Status: AC | PRN
  Administered 2022-06-27: 10 mL
  Filled 2022-06-27: qty 10

## 2022-06-27 NOTE — Patient Instructions (Signed)

## 2022-07-04 ENCOUNTER — Inpatient Hospital Stay: Payer: BC Managed Care – PPO

## 2022-07-04 VITALS — BP 114/77 | HR 72 | Temp 98.1°F | Resp 16

## 2022-07-04 DIAGNOSIS — D509 Iron deficiency anemia, unspecified: Secondary | ICD-10-CM

## 2022-07-04 DIAGNOSIS — D508 Other iron deficiency anemias: Secondary | ICD-10-CM | POA: Diagnosis not present

## 2022-07-04 DIAGNOSIS — E611 Iron deficiency: Secondary | ICD-10-CM

## 2022-07-04 DIAGNOSIS — K909 Intestinal malabsorption, unspecified: Secondary | ICD-10-CM | POA: Diagnosis not present

## 2022-07-04 MED ORDER — SODIUM CHLORIDE 0.9 % IV SOLN
200.0000 mg | Freq: Once | INTRAVENOUS | Status: AC
  Administered 2022-07-04: 200 mg via INTRAVENOUS
  Filled 2022-07-04: qty 200

## 2022-07-04 MED ORDER — SODIUM CHLORIDE 0.9 % IV SOLN
Freq: Once | INTRAVENOUS | Status: AC
  Filled 2022-07-04: qty 250

## 2022-07-11 ENCOUNTER — Inpatient Hospital Stay: Payer: BC Managed Care – PPO

## 2022-07-11 ENCOUNTER — Inpatient Hospital Stay: Payer: BC Managed Care – PPO | Attending: Internal Medicine

## 2022-07-11 VITALS — BP 121/81 | HR 70 | Temp 96.9°F | Resp 16

## 2022-07-11 DIAGNOSIS — D500 Iron deficiency anemia secondary to blood loss (chronic): Secondary | ICD-10-CM | POA: Diagnosis not present

## 2022-07-11 DIAGNOSIS — N920 Excessive and frequent menstruation with regular cycle: Secondary | ICD-10-CM | POA: Insufficient documentation

## 2022-07-11 DIAGNOSIS — D508 Other iron deficiency anemias: Secondary | ICD-10-CM | POA: Insufficient documentation

## 2022-07-11 DIAGNOSIS — K909 Intestinal malabsorption, unspecified: Secondary | ICD-10-CM | POA: Insufficient documentation

## 2022-07-11 DIAGNOSIS — E611 Iron deficiency: Secondary | ICD-10-CM

## 2022-07-11 DIAGNOSIS — D509 Iron deficiency anemia, unspecified: Secondary | ICD-10-CM

## 2022-07-11 MED ORDER — SODIUM CHLORIDE 0.9 % IV SOLN
200.0000 mg | Freq: Once | INTRAVENOUS | Status: AC
  Administered 2022-07-11: 200 mg via INTRAVENOUS
  Filled 2022-07-11: qty 200

## 2022-07-11 MED ORDER — SODIUM CHLORIDE 0.9 % IV SOLN
Freq: Once | INTRAVENOUS | Status: AC
  Filled 2022-07-11: qty 250

## 2022-07-15 DIAGNOSIS — Z9884 Bariatric surgery status: Secondary | ICD-10-CM | POA: Diagnosis not present

## 2022-07-15 DIAGNOSIS — Z6835 Body mass index (BMI) 35.0-35.9, adult: Secondary | ICD-10-CM | POA: Diagnosis not present

## 2022-07-16 ENCOUNTER — Ambulatory Visit (INDEPENDENT_AMBULATORY_CARE_PROVIDER_SITE_OTHER): Payer: BC Managed Care – PPO | Admitting: Gastroenterology

## 2022-07-16 ENCOUNTER — Encounter: Payer: Self-pay | Admitting: Gastroenterology

## 2022-07-16 VITALS — BP 115/79 | HR 71 | Temp 98.4°F | Ht 67.0 in | Wt 229.0 lb

## 2022-07-16 DIAGNOSIS — D509 Iron deficiency anemia, unspecified: Secondary | ICD-10-CM

## 2022-07-16 DIAGNOSIS — R748 Abnormal levels of other serum enzymes: Secondary | ICD-10-CM | POA: Diagnosis not present

## 2022-07-16 MED ORDER — NA SULFATE-K SULFATE-MG SULF 17.5-3.13-1.6 GM/177ML PO SOLN: 1.0000 | Freq: Once | ORAL | 0 refills | Status: AC

## 2022-07-16 NOTE — Progress Notes (Signed)
Gastroenterology Consultation  Referring Provider:     Dale Moody, MD Primary Care Physician:  Dale [place], MD Primary Gastroenterologist:  Dr. Servando Snare     Reason for Consultation:     Abnormal liver enzymes        HPI:   Erica Hernandez is a 46 y.o. y/o female referred for consultation & management of abnormal liver enzymes by Dr. Lorin Picket, Westley Hummer, MD. This patient comes to see me after being found to have abnormal liver enzymes.  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  The patient has been found to have iron deficiency anemia and has been treated with iron.  She also has had chronically elevated liver enzymes with her trend showing:  Component     Latest Ref Rng 10/11/2021 03/03/2022 03/24/2022  Total Bilirubin     0.2 - 1.2 mg/dL 0.3  0.5  0.4   Bilirubin, Direct     0.0 - 0.3 mg/dL 0.0  0.1  0.0   Alkaline Phosphatase     39 - 117 U/L 87  111  89   AST     0 - 37 U/L 31  40 (H)  35   ALT     0 - 35 U/L 45 (H)  63 (H)  58 (H)    The patient's hepatitis C antibody was negative back in 2022.  She also had an ultrasound that did not show any abnormalities or increased echogenicity of the liver. The patient appears to have normal liver enzymes prior to having gastric bypass surgery.  She also had her gallbladder removed at the time of the surgery.  The patient denies any Advil Aleve Motrin BCs or Goody powder use and only takes Tylenol as needed.  She also denies any alcohol abuse or high risk activities such as IV drug use, homemade tattoos or high risk sexual activity with high risk individuals.   Past Medical History:  Diagnosis Date   Hypertension    during pregnancy    Past Surgical History:  Procedure Laterality Date   HERNIA REPAIR     SHOULDER SURGERY  03/30/11   TONSILLECTOMY  1984    Prior to Admission medications   Medication Sig Start Date End Date Taking? Authorizing Provider  Biotin 10 MG CAPS  Take by mouth.    [provider]  Calcium Carbonate-Vitamin D3 600-400 MG-UNIT TABS Take 1 mg by mouth daily.    [provider]  ciclopirox (PENLAC) 8 % solution Apply topically at bedtime. Apply over nail and surrounding skin. Apply daily over previous coat. After seven (7) days, may remove with alcohol and continue cycle. 09/17/21   Dale Olivia, MD  [facility] F[facility] GUMMIES PO Take by mouth.    [provider]  pantoprazole (PROTONIX) 40 MG tablet TAKE 1 TABLET (40 MG TOTAL) BY MOUTH AT BEDTIME. 30 MINUTES BEFORE DINNER 10/01/21   Dale Newberry, MD  tranexamic acid (LYSTEDA) 650 MG TABS tablet Take 2 tablets (1,300 mg total) by mouth 3 (three) times daily. 06/11/22   Dale Hatley, MD  norethindrone (MICRONOR) 0.35 MG tablet Take 1 tablet (0.35 mg total) by mouth daily. 10/03/19 06/15/20  Dale , MD    Family History  Problem Relation Age of Onset   Hypertension Father    Breast cancer Maternal Grandmother        56-70   Breast cancer Paternal Grandmother        7-70  Diabetes Other        parent   Colon cancer Neg Hx      Social History   Tobacco Use   Smoking status: Never   Smokeless tobacco: Never  Vaping Use   Vaping Use: Never used  Substance Use Topics   Alcohol use: No    Alcohol/week: 0.0 standard drinks of alcohol   Drug use: No    Allergies as of 07/16/2022 - Review Complete 06/19/2022  Allergen Reaction Noted   Sulfa antibiotics  09/16/2012    Review of Systems:    All systems reviewed and negative except where noted in HPI.   Physical Exam:  There were no vitals taken for this visit. No LMP recorded. General:   Alert,  Well-developed, well-nourished, pleasant and cooperative in NAD Head:  Normocephalic and atraumatic. Eyes:  Sclera clear, no icterus.   Conjunctiva pink. Ears:  Normal auditory acuity. Neck:  Supple; no masses or thyromegaly. Lungs:  Respirations even and unlabored.  Clear throughout to auscultation.    No wheezes, crackles, or rhonchi. No acute distress. Heart:  Regular rate and rhythm; no murmurs, clicks, rubs, or gallops. Abdomen:  Normal bowel sounds.  No bruits.  Soft, non-tender and non-distended without masses, hepatosplenomegaly or hernias noted.  No guarding or rebound tenderness.  Negative Carnett sign.   Rectal:  Deferred.  Pulses:  Normal pulses noted. Extremities:  No clubbing or edema.  No cyanosis. Neurologic:  Alert and oriented x3;  grossly normal neurologically. Skin:  Intact without significant lesions or rashes.  No jaundice. Lymph Nodes:  No significant cervical adenopathy. Psych:  Alert and cooperative. Normal mood and affect.  Imaging Studies: No results found.  Assessment and Plan:   Erica Hernandez is a 46 y.o. y/o female who comes in today with abnormal liver enzymes.  The patient's AST has been normal except for 1 instance with the ALT being elevated for the last few years.  The patient's blood will be sent off for a GGT to see if the enzyme elevation is from the liver.  The patient also has a history of anemia and will be set up for an EGD and colonoscopy to rule out any GI source of blood loss.  She will also have a capsule endoscopy done if those are negative.  The patient will also have blood work sent off for other possible cause of abnormal liver enzymes and will check for her immunity to hepatitis A and hepatitis B to see if she will need vaccinations for these.  The patient has been explained the plan and agrees with it.  Midge Minium, MD. Clementeen Graham    Note: This dictation was prepared with Dragon dictation along with smaller phrase technology. Any transcriptional errors that result from this process are unintentional.

## 2022-07-16 NOTE — Addendum Note (Signed)
Addended by: Roena Malady on: 07/16/2022 10:16 AM   Modules accepted: Orders

## 2022-07-17 LAB — HEPATIC FUNCTION PANEL
ALT: 57 IU/L — ABNORMAL HIGH (ref 0–32)
AST: 32 IU/L (ref 0–40)
Albumin: 4.6 g/dL (ref 3.9–4.9)
Alkaline Phosphatase: 100 IU/L (ref 44–121)
Bilirubin Total: 0.3 mg/dL (ref 0.0–1.2)
Bilirubin, Direct: 0.1 mg/dL (ref 0.00–0.40)
Total Protein: 6.7 g/dL (ref 6.0–8.5)

## 2022-07-17 LAB — CERULOPLASMIN: Ceruloplasmin: 25.9 mg/dL (ref 19.0–39.0)

## 2022-07-17 LAB — ANA: Anti Nuclear Antibody (ANA): NEGATIVE

## 2022-07-17 LAB — MITOCHONDRIAL ANTIBODIES: Mitochondrial Ab: <20 Units (ref 0.0–20.0)

## 2022-07-17 LAB — HEPATITIS B SURFACE ANTIGEN: Hepatitis B Surface Ag: NEGATIVE

## 2022-07-17 LAB — HEPATITIS B SURFACE ANTIBODY,QUALITATIVE: Hep B Surface Ab, Qual: NONREACTIVE

## 2022-07-17 LAB — ALPHA-1-ANTITRYPSIN: A-1 Antitrypsin: 150 mg/dL (ref 101–187)

## 2022-07-17 LAB — HEPATITIS A ANTIBODY, TOTAL: hep A Total Ab: NEGATIVE

## 2022-07-17 LAB — ANTI-SMOOTH MUSCLE ANTIBODY, IGG: Smooth Muscle Ab: 8 Units (ref 0–19)

## 2022-07-17 LAB — GAMMA GT: GGT: 10 IU/L (ref 0–60)

## 2022-07-18 ENCOUNTER — Inpatient Hospital Stay: Payer: BC Managed Care – PPO

## 2022-07-18 VITALS — BP 118/72 | HR 73 | Temp 97.8°F | Resp 16

## 2022-07-18 DIAGNOSIS — N920 Excessive and frequent menstruation with regular cycle: Secondary | ICD-10-CM | POA: Diagnosis not present

## 2022-07-18 DIAGNOSIS — D509 Iron deficiency anemia, unspecified: Secondary | ICD-10-CM

## 2022-07-18 DIAGNOSIS — D508 Other iron deficiency anemias: Secondary | ICD-10-CM | POA: Diagnosis not present

## 2022-07-18 DIAGNOSIS — E611 Iron deficiency: Secondary | ICD-10-CM

## 2022-07-18 DIAGNOSIS — K909 Intestinal malabsorption, unspecified: Secondary | ICD-10-CM | POA: Diagnosis not present

## 2022-07-18 DIAGNOSIS — D500 Iron deficiency anemia secondary to blood loss (chronic): Secondary | ICD-10-CM | POA: Diagnosis not present

## 2022-07-18 MED ORDER — SODIUM CHLORIDE 0.9 % IV SOLN
Freq: Once | INTRAVENOUS | Status: AC
  Filled 2022-07-18: qty 250

## 2022-07-18 MED ORDER — SODIUM CHLORIDE 0.9 % IV SOLN
200.0000 mg | Freq: Once | INTRAVENOUS | Status: AC
  Administered 2022-07-18: 200 mg via INTRAVENOUS
  Filled 2022-07-18: qty 200

## 2022-07-18 NOTE — Patient Instructions (Signed)

## 2022-07-25 ENCOUNTER — Inpatient Hospital Stay: Payer: BC Managed Care – PPO

## 2022-07-30 ENCOUNTER — Encounter: Payer: Self-pay | Admitting: Gastroenterology

## 2022-07-30 DIAGNOSIS — R748 Abnormal levels of other serum enzymes: Secondary | ICD-10-CM

## 2022-07-31 ENCOUNTER — Ambulatory Visit
Admission: RE | Admit: 2022-07-31 | Discharge: 2022-07-31 | Disposition: A | Discharge status: Home / Self Care | Payer: BC Managed Care – PPO | Source: Ambulatory Visit | Attending: Internal Medicine | Admitting: Internal Medicine

## 2022-07-31 DIAGNOSIS — Z1231 Encounter for screening mammogram for malignant neoplasm of breast: Secondary | ICD-10-CM | POA: Insufficient documentation

## 2022-07-31 NOTE — Addendum Note (Signed)
Addended by: Lurlean Nanny on: 07/31/2022 11:48 AM   Modules accepted: Orders

## 2022-08-01 ENCOUNTER — Other Ambulatory Visit: Payer: Self-pay | Admitting: Internal Medicine

## 2022-08-01 DIAGNOSIS — N6489 Other specified disorders of breast: Secondary | ICD-10-CM

## 2022-08-01 DIAGNOSIS — R928 Other abnormal and inconclusive findings on diagnostic imaging of breast: Secondary | ICD-10-CM

## 2022-08-03 ENCOUNTER — Encounter: Payer: Self-pay | Admitting: Internal Medicine

## 2022-08-05 ENCOUNTER — Ambulatory Visit
Admission: RE | Admit: 2022-08-05 | Discharge: 2022-08-05 | Disposition: A | Discharge status: Home / Self Care | Payer: BC Managed Care – PPO | Source: Ambulatory Visit | Attending: Internal Medicine | Admitting: Internal Medicine

## 2022-08-05 DIAGNOSIS — N6489 Other specified disorders of breast: Secondary | ICD-10-CM

## 2022-08-05 DIAGNOSIS — R928 Other abnormal and inconclusive findings on diagnostic imaging of breast: Secondary | ICD-10-CM

## 2022-08-19 ENCOUNTER — Telehealth: Payer: Self-pay | Admitting: Nurse Practitioner

## 2022-08-19 ENCOUNTER — Encounter: Payer: Self-pay | Admitting: Nurse Practitioner

## 2022-08-19 ENCOUNTER — Encounter: Payer: Self-pay | Admitting: Internal Medicine

## 2022-08-19 ENCOUNTER — Telehealth: Payer: Self-pay | Admitting: *Deleted

## 2022-08-19 ENCOUNTER — Inpatient Hospital Stay: Payer: Managed Care, Other (non HMO) | Attending: Internal Medicine

## 2022-08-19 DIAGNOSIS — D509 Iron deficiency anemia, unspecified: Secondary | ICD-10-CM

## 2022-08-19 DIAGNOSIS — K909 Intestinal malabsorption, unspecified: Secondary | ICD-10-CM | POA: Diagnosis present

## 2022-08-19 DIAGNOSIS — D508 Other iron deficiency anemias: Secondary | ICD-10-CM | POA: Diagnosis present

## 2022-08-19 LAB — CBC WITH DIFFERENTIAL/PLATELET
Abs Immature Granulocytes: 0.02 10*3/uL (ref 0.00–0.07)
Basophils Absolute: 0 10*3/uL (ref 0.0–0.1)
Basophils Relative: 1 %
Eosinophils Absolute: 0.1 10*3/uL (ref 0.0–0.5)
Eosinophils Relative: 1 %
HCT: 40.5 % (ref 36.0–46.0)
Hemoglobin: 13.3 g/dL (ref 12.0–15.0)
Immature Granulocytes: 0 %
Lymphocytes Relative: 26 %
Lymphs Abs: 1.6 10*3/uL (ref 0.7–4.0)
MCH: 29.3 pg (ref 26.0–34.0)
MCHC: 32.8 g/dL (ref 30.0–36.0)
MCV: 89.2 fL (ref 80.0–100.0)
Monocytes Absolute: 0.3 10*3/uL (ref 0.1–1.0)
Monocytes Relative: 6 %
Neutro Abs: 4 10*3/uL (ref 1.7–7.7)
Neutrophils Relative %: 66 %
Platelets: 230 10*3/uL (ref 150–400)
RBC: 4.54 MIL/uL (ref 3.87–5.11)
RDW: 15.6 % — ABNORMAL HIGH (ref 11.5–15.5)
WBC: 6.1 10*3/uL (ref 4.0–10.5)
nRBC: 0 % (ref 0.0–0.2)

## 2022-08-19 LAB — IRON AND TIBC
Iron: 48 ug/dL (ref 28–170)
Saturation Ratios: 13 % (ref 10.4–31.8)
TIBC: 360 ug/dL (ref 250–450)
UIBC: 312 ug/dL

## 2022-08-19 LAB — FERRITIN: Ferritin: 34 ng/mL (ref 11–307)

## 2022-08-19 NOTE — Telephone Encounter (Signed)
Reviewed labs with patient. Ferritin, hemoglobin have improved. Could consider additional venofer however given her symptoms. She mentions that husband has headache, fatigue, sore throat, and sinus congestion. She has similar symptoms. I recommended covid testing. She will notify clinic if she'd like to get scheduled for additional venofer. Follow up as scheduled.

## 2022-08-19 NOTE — Telephone Encounter (Signed)
Patient called asking to be checked as she feels her iron has dropped. She is having a sensation that her head does not feel right, increased tiredness, and dizziness without gait disturbance. Please advise

## 2022-08-19 NOTE — Telephone Encounter (Signed)
Per Alease Medina, NP, patient to come ioni for labs CBC, IIBC, Ferr today, patient agrees and accepted an appointment for 1115 this morning. Lab notified of add on

## 2022-08-20 NOTE — Telephone Encounter (Signed)
2 doses- Per Lauren dose this week and dose next. Follow up as scheduled in November. Thanks :)

## 2022-08-21 ENCOUNTER — Inpatient Hospital Stay: Payer: Managed Care, Other (non HMO)

## 2022-08-21 VITALS — BP 113/74 | HR 80 | Temp 98.6°F | Resp 18

## 2022-08-21 DIAGNOSIS — E611 Iron deficiency: Secondary | ICD-10-CM

## 2022-08-21 DIAGNOSIS — D509 Iron deficiency anemia, unspecified: Secondary | ICD-10-CM

## 2022-08-21 DIAGNOSIS — D508 Other iron deficiency anemias: Secondary | ICD-10-CM | POA: Diagnosis not present

## 2022-08-21 MED ORDER — SODIUM CHLORIDE 0.9 % IV SOLN
Freq: Once | INTRAVENOUS | Status: AC
  Filled 2022-08-21: qty 250

## 2022-08-21 MED ORDER — SODIUM CHLORIDE 0.9 % IV SOLN
200.0000 mg | Freq: Once | INTRAVENOUS | Status: AC
  Administered 2022-08-21: 200 mg via INTRAVENOUS
  Filled 2022-08-21: qty 200

## 2022-08-22 ENCOUNTER — Encounter: Payer: Self-pay | Admitting: Internal Medicine

## 2022-08-29 ENCOUNTER — Ambulatory Visit: Payer: BC Managed Care – PPO | Admitting: Internal Medicine

## 2022-08-29 ENCOUNTER — Other Ambulatory Visit: Payer: BC Managed Care – PPO

## 2022-08-29 ENCOUNTER — Ambulatory Visit: Payer: BC Managed Care – PPO

## 2022-08-29 ENCOUNTER — Inpatient Hospital Stay: Payer: Managed Care, Other (non HMO)

## 2022-08-29 VITALS — BP 125/71 | HR 72 | Temp 98.2°F | Resp 16

## 2022-08-29 DIAGNOSIS — D509 Iron deficiency anemia, unspecified: Secondary | ICD-10-CM

## 2022-08-29 DIAGNOSIS — E611 Iron deficiency: Secondary | ICD-10-CM

## 2022-08-29 DIAGNOSIS — D508 Other iron deficiency anemias: Secondary | ICD-10-CM | POA: Diagnosis not present

## 2022-08-29 MED ORDER — SODIUM CHLORIDE 0.9 % IV SOLN
Freq: Once | INTRAVENOUS | Status: AC
  Filled 2022-08-29: qty 250

## 2022-08-29 MED ORDER — SODIUM CHLORIDE 0.9 % IV SOLN
200.0000 mg | Freq: Once | INTRAVENOUS | Status: AC
  Administered 2022-08-29: 200 mg via INTRAVENOUS
  Filled 2022-08-29: qty 200

## 2022-09-01 ENCOUNTER — Encounter: Payer: Self-pay | Admitting: Internal Medicine

## 2022-09-02 ENCOUNTER — Ambulatory Visit: Payer: BC Managed Care – PPO | Admitting: Internal Medicine

## 2022-09-03 ENCOUNTER — Telehealth (INDEPENDENT_AMBULATORY_CARE_PROVIDER_SITE_OTHER): Payer: Managed Care, Other (non HMO) | Admitting: Family

## 2022-09-03 ENCOUNTER — Encounter: Payer: Self-pay | Admitting: Family

## 2022-09-03 VITALS — Temp 100.0°F | Ht 67.0 in | Wt 216.0 lb

## 2022-09-03 DIAGNOSIS — U071 COVID-19: Secondary | ICD-10-CM

## 2022-09-03 MED ORDER — NIRMATRELVIR/RITONAVIR (PAXLOVID)TABLET: 3.0000 | ORAL_TABLET | Freq: Two times a day (BID) | ORAL | 0 refills | Status: AC

## 2022-09-03 NOTE — Progress Notes (Signed)
MyChart Video Visit    Virtual Visit via Video Note   This format is felt to be most appropriate for this patient at this time. Physical exam was limited by quality of the video and audio technology used for the visit. CMA was able to get the patient set up on a video visit.  Patient location: Home. Patient and provider in visit Provider location: Office  I discussed the limitations of evaluation and management by telemedicine and the availability of in person appointments. The patient expressed understanding and agreed to proceed.  Visit Date: 09/03/2022  Today's healthcare provider: Dulce Sellar, NP     Subjective:   Patient ID: Erica Hernandez, female    DOB: [birth year], 46 y.o.   MRN: [id number]  Chief Complaint  Patient presents with   Covid Positive    Pt is covid positive no other concerns.   HPI URI:  reports sx started on Monday and she tested positive on same day.  Reports sinus sx and cough, had covid in 2021 and took Paxlovid and tolerated well, she would like to take this again.  Assessment & Plan:   Problem List Items Addressed This Visit   None Visit Diagnoses     COVID-19    -  Primary Sending Paxlovid pt advised of FDA label of approval, how to take, & SE. Advised of CDC guidelines for masking if out in public. OK to continue taking OTC sinus or pain meds. Encouraged to monitor & notify office of any worsening symptoms: increased shortness of breath, weakness, and signs of dehydration. Instructed to rest and hydrate well.     Relevant Medications   nirmatrelvir/ritonavir EUA (PAXLOVID) 20 x 150 MG & 10 x 100MG  TABS      Past Medical History:  Diagnosis Date   Hypertension    during pregnancy    Past Surgical History:  Procedure Laterality Date   HERNIA REPAIR     SHOULDER SURGERY  03/30/11   TONSILLECTOMY  1984    Outpatient Medications Prior to Visit  Medication Sig Dispense Refill   Biotin 10 MG CAPS Take by mouth.     Calcium  Carbonate-Vitamin D3 600-400 MG-UNIT TABS Take 1 mg by mouth daily.     ciclopirox (PENLAC) 8 % solution Apply topically at bedtime. Apply over nail and surrounding skin. Apply daily over previous coat. After seven (7) days, may remove with alcohol and continue cycle. 6.6 mL 1   [facility] F[facility] GUMMIES PO Take by mouth.     pantoprazole (PROTONIX) 40 MG tablet TAKE 1 TABLET (40 MG TOTAL) BY MOUTH AT BEDTIME. 30 MINUTES BEFORE DINNER 90 tablet 3   tranexamic acid (LYSTEDA) 650 MG TABS tablet Take 2 tablets (1,300 mg total) by mouth 3 (three) times daily. 30 tablet 3   No facility-administered medications prior to visit.    Allergies  Allergen Reactions   Sulfa Antibiotics       Objective:   Physical Exam Vitals and nursing note reviewed.  Constitutional:      General: Pt is not in acute distress.    Appearance: Normal appearance.  HENT:     Head: Normocephalic.  Pulmonary:     Effort: No respiratory distress.  Musculoskeletal:     Cervical back: Normal range of motion.  Skin:    General: Skin is dry.     Coloration: Skin is not pale.  Neurological:     Mental Status: Pt is alert and oriented to person, place, and  time.  Psychiatric:        Mood and Affect: Mood normal.   Temp 100 F (37.8 C) (Axillary)   Ht 5\' 7"  (1.702 m)   Wt 216 lb (98 kg)   BMI 33.83 kg/m   Wt Readings from Last 3 Encounters:  09/03/22 216 lb (98 kg)  07/16/22 229 lb (103.9 kg)  06/19/22 227 lb 9.6 oz (103.2 kg)      I discussed the assessment and treatment plan with the patient. The patient was provided an opportunity to ask questions and all were answered. The patient agreed with the plan and demonstrated an understanding of the instructions.   The patient was advised to call back or seek an in-person evaluation if the symptoms worsen or if the condition fails to improve as anticipated.  Jeanie Sewer, NP [place] [phone] (phone) [phone] (fax)  [place]

## 2022-09-03 NOTE — Telephone Encounter (Signed)
Spoke with pt and scheduled her for a virtual visit with Jeanie Sewer at [facility].

## 2022-09-18 ENCOUNTER — Inpatient Hospital Stay: Payer: Managed Care, Other (non HMO) | Attending: Internal Medicine

## 2022-09-18 ENCOUNTER — Other Ambulatory Visit: Payer: BC Managed Care – PPO

## 2022-09-18 DIAGNOSIS — G4733 Obstructive sleep apnea (adult) (pediatric): Secondary | ICD-10-CM | POA: Insufficient documentation

## 2022-09-18 DIAGNOSIS — N920 Excessive and frequent menstruation with regular cycle: Secondary | ICD-10-CM | POA: Insufficient documentation

## 2022-09-18 DIAGNOSIS — Z803 Family history of malignant neoplasm of breast: Secondary | ICD-10-CM | POA: Diagnosis not present

## 2022-09-18 DIAGNOSIS — K909 Intestinal malabsorption, unspecified: Secondary | ICD-10-CM | POA: Diagnosis present

## 2022-09-18 DIAGNOSIS — D508 Other iron deficiency anemias: Secondary | ICD-10-CM | POA: Insufficient documentation

## 2022-09-18 DIAGNOSIS — E538 Deficiency of other specified B group vitamins: Secondary | ICD-10-CM | POA: Diagnosis not present

## 2022-09-18 DIAGNOSIS — D509 Iron deficiency anemia, unspecified: Secondary | ICD-10-CM

## 2022-09-18 LAB — CBC WITH DIFFERENTIAL/PLATELET
Abs Immature Granulocytes: 0.01 10*3/uL (ref 0.00–0.07)
Basophils Absolute: 0 10*3/uL (ref 0.0–0.1)
Basophils Relative: 1 %
Eosinophils Absolute: 0.1 10*3/uL (ref 0.0–0.5)
Eosinophils Relative: 2 %
HCT: 38.8 % (ref 36.0–46.0)
Hemoglobin: 12.6 g/dL (ref 12.0–15.0)
Immature Granulocytes: 0 %
Lymphocytes Relative: 37 %
Lymphs Abs: 1.8 10*3/uL (ref 0.7–4.0)
MCH: 29.4 pg (ref 26.0–34.0)
MCHC: 32.5 g/dL (ref 30.0–36.0)
MCV: 90.4 fL (ref 80.0–100.0)
Monocytes Absolute: 0.4 10*3/uL (ref 0.1–1.0)
Monocytes Relative: 8 %
Neutro Abs: 2.7 10*3/uL (ref 1.7–7.7)
Neutrophils Relative %: 52 %
Platelets: 229 10*3/uL (ref 150–400)
RBC: 4.29 MIL/uL (ref 3.87–5.11)
RDW: 14.6 % (ref 11.5–15.5)
WBC: 5 10*3/uL (ref 4.0–10.5)
nRBC: 0 % (ref 0.0–0.2)

## 2022-09-18 LAB — COMPREHENSIVE METABOLIC PANEL
ALT: 30 U/L (ref 0–44)
AST: 24 U/L (ref 15–41)
Albumin: 4.1 g/dL (ref 3.5–5.0)
Alkaline Phosphatase: 80 U/L (ref 38–126)
Anion gap: 6 (ref 5–15)
BUN: 22 mg/dL — ABNORMAL HIGH (ref 6–20)
CO2: 26 mmol/L (ref 22–32)
Calcium: 9.1 mg/dL (ref 8.9–10.3)
Chloride: 105 mmol/L (ref 98–111)
Creatinine, Ser: 0.74 mg/dL (ref 0.44–1.00)
GFR, Estimated: >60 mL/min (ref >60)
Glucose, Bld: 87 mg/dL (ref 70–99)
Potassium: 4 mmol/L (ref 3.5–5.1)
Sodium: 137 mmol/L (ref 135–145)
Total Bilirubin: 0.2 mg/dL — ABNORMAL LOW (ref 0.3–1.2)
Total Protein: 6.9 g/dL (ref 6.5–8.1)

## 2022-09-18 LAB — IRON AND TIBC
Iron: 59 ug/dL (ref 28–170)
Saturation Ratios: 19 % (ref 10.4–31.8)
TIBC: 309 ug/dL (ref 250–450)
UIBC: 250 ug/dL

## 2022-09-18 LAB — FERRITIN: Ferritin: 71 ng/mL (ref 11–307)

## 2022-09-19 LAB — VITAMIN B12: Vitamin B-12: 2330 pg/mL — ABNORMAL HIGH (ref 180–914)

## 2022-09-19 MED FILL — Iron Sucrose Inj 20 MG/ML (Fe Equiv): INTRAVENOUS | Qty: 10 | Status: AC

## 2022-09-22 ENCOUNTER — Inpatient Hospital Stay: Payer: Managed Care, Other (non HMO)

## 2022-09-22 ENCOUNTER — Encounter: Payer: Self-pay | Admitting: Internal Medicine

## 2022-09-22 ENCOUNTER — Inpatient Hospital Stay (HOSPITAL_BASED_OUTPATIENT_CLINIC_OR_DEPARTMENT_OTHER): Payer: Managed Care, Other (non HMO) | Admitting: Internal Medicine

## 2022-09-22 VITALS — BP 116/70 | HR 72 | Temp 98.6°F | Resp 18 | Wt 221.0 lb

## 2022-09-22 DIAGNOSIS — Z9884 Bariatric surgery status: Secondary | ICD-10-CM

## 2022-09-22 DIAGNOSIS — E611 Iron deficiency: Secondary | ICD-10-CM

## 2022-09-22 DIAGNOSIS — D508 Other iron deficiency anemias: Secondary | ICD-10-CM | POA: Diagnosis not present

## 2022-09-22 DIAGNOSIS — D509 Iron deficiency anemia, unspecified: Secondary | ICD-10-CM

## 2022-09-22 NOTE — Progress Notes (Signed)
[place] NOTE  Patient Care Team: Einar Pheasant, MD as PCP - General (Internal Medicine)  CHIEF COMPLAINTS/PURPOSE OF CONSULTATION:    HEMATOLOGY HISTORY  # IRON DEF ANEMIA -malabsorption /heavy menstrual cycles. 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    HISTORY OF PRESENTING ILLNESS: Alone.  Ambulating independently.  Erica Hernandez 46 y.o.  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   Reports feeling well. Denies any complaints.  Scheduled for colonoscopy this week.    Review of Systems  Constitutional:  Negative for chills, diaphoresis, fever and weight loss.  HENT:  Negative for congestion, ear discharge, ear pain, nosebleeds, sinus pain and sore throat.   Eyes:  Negative for pain and redness.  Respiratory:  Negative for cough, hemoptysis, sputum production, shortness of breath, wheezing and stridor.   Cardiovascular:  Negative for chest pain, palpitations and leg swelling.  Gastrointestinal:  Negative for abdominal pain, constipation, diarrhea, nausea and vomiting.  Musculoskeletal:  Negative for falls, joint pain and myalgias.  Skin:  Negative for rash.  Neurological:  Negative for dizziness, loss of consciousness, weakness and headaches.  Psychiatric/Behavioral:  Negative for depression. The patient is not nervous/anxious and does not have insomnia.   All other systems reviewed and are negative.   MEDICAL HISTORY:  Past Medical History:  Diagnosis Date   Hypertension    during pregnancy    SURGICAL HISTORY: Past Surgical History:  Procedure Laterality Date   HERNIA REPAIR     SHOULDER SURGERY  03/30/11   TONSILLECTOMY  1984    SOCIAL HISTORY: Social  History   Socioeconomic History   Marital status: Married    Spouse name: Not on file   Number of children: 1   Years of education: Not on file   Highest education level: Not on file  Occupational History    Employer: [employer]  Tobacco Use   Smoking status: Never   Smokeless tobacco: Never  Vaping Use   Vaping Use: Never used  Substance and Sexual Activity   Alcohol use: No    Alcohol/week: 0.0 standard drinks of alcohol   Drug use: No   Sexual activity: Not on file  Other Topics Concern   Not on file  Social History Narrative   Lives in [place]; Network engineer- garage; never smoked; rare alcohol.    Social Determinants of Health   Financial Resource Strain: Not on file  Food Insecurity: Not on file  Transportation Needs: Not on file  Physical Activity: Not on file  Stress: Not on file  Social Connections: Not on file  Intimate Partner Violence: Not on file    FAMILY HISTORY: Family History  Problem Relation Age of Onset   Hypertension Father    Breast cancer Maternal Grandmother        60-70   Breast cancer Paternal Grandmother        87-70   Diabetes Other        parent   Colon cancer Neg Hx     ALLERGIES:  is allergic to sulfa antibiotics.  MEDICATIONS:  Current Outpatient Medications  Medication Sig Dispense Refill   Biotin 10 MG CAPS Take by mouth.     Calcium Carbonate-Vitamin D3 600-400 MG-UNIT TABS Take 1  mg by mouth daily.     ciclopirox (PENLAC) 8 % solution Apply topically at bedtime. Apply over nail and surrounding skin. Apply daily over previous coat. After seven (7) days, may remove with alcohol and continue cycle. 6.6 mL 1   [facility] F[employer] GUMMIES PO Take by mouth.     pantoprazole (PROTONIX) 40 MG tablet TAKE 1 TABLET (40 MG TOTAL) BY MOUTH AT BEDTIME. 30 MINUTES BEFORE DINNER 90 tablet 3   tranexamic acid (LYSTEDA) 650 MG TABS tablet Take 2 tablets (1,300 mg total) by mouth 3 (three) times daily. 30 tablet 3   cetirizine (ZYRTEC) 10 MG tablet  Take by mouth.     No current facility-administered medications for this visit.      PHYSICAL EXAMINATION:   Vitals:   09/22/22 1336  BP: 116/70  Pulse: 72  Resp: 18  Temp: 98.6 F (37 C)  SpO2: 98%   Filed Weights   09/22/22 1336  Weight: 221 lb (100.2 kg)    Physical Exam HENT:     Head: Normocephalic and atraumatic.     Mouth/Throat:     Pharynx: No oropharyngeal exudate.  Eyes:     Pupils: Pupils are equal, round, and reactive to light.  Cardiovascular:     Rate and Rhythm: Normal rate and regular rhythm.  Pulmonary:     Effort: Pulmonary effort is normal. No respiratory distress.     Breath sounds: Normal breath sounds. No wheezing.  Abdominal:     General: Bowel sounds are normal. There is no distension.     Palpations: Abdomen is soft. There is no mass.     Tenderness: There is no abdominal tenderness. There is no guarding or rebound.  Musculoskeletal:        General: No tenderness. Normal range of motion.     Cervical back: Normal range of motion and neck supple.  Skin:    General: Skin is warm.  Neurological:     Mental Status: She is alert and oriented to person, place, and time.  Psychiatric:        Mood and Affect: Affect normal.    LABORATORY DATA:  I have reviewed the data as listed Lab Results  Component Value Date   WBC 5.0 09/18/2022   HGB 12.6 09/18/2022   HCT 38.8 09/18/2022   MCV 90.4 09/18/2022   PLT 229 09/18/2022   Recent Labs    02/28/22 1238 03/03/22 0814 03/24/22 0732 07/16/22 1001 09/18/22 1340  NA 136  --   --   --  137  K 4.0  --   --   --  4.0  CL 106  --   --   --  105  CO2 26  --   --   --  26  GLUCOSE 100*  --   --   --  87  BUN 18  --   --   --  22*  CREATININE 0.82  --   --   --  0.74  CALCIUM 8.6*  --   --   --  9.1  GFRNONAA >60  --   --   --  >60  PROT  --  6.2 6.2 6.7 6.9  ALBUMIN  --  3.8 3.8 4.6 4.1  AST  --  40* 35 32 24  ALT  --  63* 58* 57* 30  ALKPHOS  --  111 89 100 80  BILITOT  --  0.5 0.4  0.3 0.2*  BILIDIR  --  0.1 0.0 0.10  --    Iron/TIBC/Ferritin/ %Sat    Component Value Date/Time   IRON 59 09/18/2022 1340   TIBC 309 09/18/2022 1340   FERRITIN 71 09/18/2022 1340   IRONPCTSAT 19 09/18/2022 1340      No results found.   Assessment & Plan:  No problem-specific Assessment & Plan notes found for this encounter.  #Iron deficiency anemia-multifactorial/malabsorption-bariatric surgery; heavy menstrual cycles. Completed IV venofer x 5. Repeat iron panel shows excellent response. Hb normalized. Planned for colonoscopy this week with Dr. Allen Norris .   # b12 deficiency- elevated. Recheck next visit.      # DISPOSITION: RTC 4 months for visit with Dr. B, labs a day prior, possible venofer.   All questions were answered. The patient knows to call the clinic with any problems, questions or concerns.   Jane Canary, MD 09/22/2022

## 2022-09-22 NOTE — Progress Notes (Signed)
Patient here for oncology follow-up appointment, expresses no complaints or concerns at this time.    

## 2022-09-26 ENCOUNTER — Ambulatory Visit: Payer: Managed Care, Other (non HMO) | Admitting: Anesthesiology

## 2022-09-26 ENCOUNTER — Ambulatory Visit
Admission: RE | Admit: 2022-09-26 | Discharge: 2022-09-26 | Disposition: A | Discharge status: Home / Self Care | Payer: Managed Care, Other (non HMO) | Attending: Gastroenterology | Admitting: Gastroenterology

## 2022-09-26 ENCOUNTER — Encounter: Payer: Self-pay | Admitting: Gastroenterology

## 2022-09-26 ENCOUNTER — Other Ambulatory Visit: Payer: Self-pay

## 2022-09-26 ENCOUNTER — Encounter
Admission: RE | Disposition: A | Discharge status: Home / Self Care | Payer: Self-pay | Source: Home / Self Care | Attending: Gastroenterology

## 2022-09-26 DIAGNOSIS — Z6833 Body mass index (BMI) 33.0-33.9, adult: Secondary | ICD-10-CM | POA: Diagnosis not present

## 2022-09-26 DIAGNOSIS — K219 Gastro-esophageal reflux disease without esophagitis: Secondary | ICD-10-CM | POA: Insufficient documentation

## 2022-09-26 DIAGNOSIS — Z9884 Bariatric surgery status: Secondary | ICD-10-CM | POA: Insufficient documentation

## 2022-09-26 DIAGNOSIS — E669 Obesity, unspecified: Secondary | ICD-10-CM | POA: Insufficient documentation

## 2022-09-26 DIAGNOSIS — D509 Iron deficiency anemia, unspecified: Secondary | ICD-10-CM | POA: Diagnosis not present

## 2022-09-26 HISTORY — PX: COLONOSCOPY: SHX5424

## 2022-09-26 HISTORY — PX: ESOPHAGOGASTRODUODENOSCOPY: SHX5428

## 2022-09-26 LAB — POCT PREGNANCY, URINE: Preg Test, Ur: NEGATIVE

## 2022-09-26 SURGERY — COLONOSCOPY
Anesthesia: General | Site: Rectum

## 2022-09-26 MED ORDER — PROPOFOL 10 MG/ML IV BOLUS
INTRAVENOUS | Status: DC | PRN
  Administered 2022-09-26 (×2): 50 mg via INTRAVENOUS
  Administered 2022-09-26: 25 mg via INTRAVENOUS
  Administered 2022-09-26: 100 mg via INTRAVENOUS
  Administered 2022-09-26 (×2): 25 mg via INTRAVENOUS

## 2022-09-26 MED ORDER — ACETAMINOPHEN 325 MG PO TABS: 650.0000 mg | ORAL_TABLET | Freq: Once | ORAL | Status: DC | PRN

## 2022-09-26 MED ORDER — LACTATED RINGERS IV SOLN: INTRAVENOUS | Status: DC

## 2022-09-26 MED ORDER — SODIUM CHLORIDE 0.9 % IV SOLN: INTRAVENOUS | Status: DC

## 2022-09-26 MED ORDER — LIDOCAINE HCL (CARDIAC) PF 100 MG/5ML IV SOSY
PREFILLED_SYRINGE | INTRAVENOUS | Status: DC | PRN
  Administered 2022-09-26: 100 mg via INTRAVENOUS

## 2022-09-26 MED ORDER — STERILE WATER FOR IRRIGATION IR SOLN
Status: DC | PRN
  Administered 2022-09-26: 1

## 2022-09-26 MED ORDER — ONDANSETRON HCL 4 MG/2ML IJ SOLN: 4.0000 mg | Freq: Once | INTRAMUSCULAR | Status: DC | PRN

## 2022-09-26 MED ORDER — ACETAMINOPHEN 160 MG/5ML PO SOLN: 325.0000 mg | ORAL | Status: DC | PRN

## 2022-09-26 SURGICAL SUPPLY — 7 items
BLOCK BITE 60FR ADLT L/F GRN (MISCELLANEOUS) ×2 IMPLANT
GOWN CVR UNV OPN BCK APRN NK (MISCELLANEOUS) ×4 IMPLANT
GOWN ISOL THUMB LOOP REG UNIV (MISCELLANEOUS) ×4
KIT PRC NS LF DISP ENDO (KITS) ×4 IMPLANT
KIT PROCEDURE OLYMPUS (KITS) ×4
MANIFOLD NEPTUNE II (INSTRUMENTS) ×2 IMPLANT
NS IRRIG 500ML POUR BTL (IV SOLUTION) IMPLANT

## 2022-09-26 NOTE — H&P (Signed)
Midge Minium, MD [facility] [address], [address] [place], [place] [address] [phone] Fax : [phone]  Primary Care Physician:  Dale [place], MD Primary Gastroenterologist:  Dr. Servando Snare  Pre-Procedure History & Physical: HPI:  Erica Hernandez is a 46 y.o. female is here for an endoscopy and colonoscopy.   Past Medical History:  Diagnosis Date   Hypertension    during pregnancy    Past Surgical History:  Procedure Laterality Date   HERNIA REPAIR     SHOULDER SURGERY  03/30/11   TONSILLECTOMY  1984    Prior to Admission medications   Medication Sig Start Date End Date Taking? Authorizing Provider  Biotin 10 MG CAPS Take by mouth.   Yes [provider]  Calcium Carbonate-Vitamin D3 600-400 MG-UNIT TABS Take 1 mg by mouth daily.   Yes [provider]  ciclopirox (PENLAC) 8 % solution Apply topically at bedtime. Apply over nail and surrounding skin. Apply daily over previous coat. After seven (7) days, may remove with alcohol and continue cycle. 09/17/21  Yes Dale [place], MD  [facility] [facility] GUMMIES PO Take by mouth.   Yes [provider]  pantoprazole (PROTONIX) 40 MG tablet TAKE 1 TABLET (40 MG TOTAL) BY MOUTH AT BEDTIME. 30 MINUTES BEFORE DINNER 10/01/21  Yes Dale Bourbon, MD  tranexamic acid (LYSTEDA) 650 MG TABS tablet Take 2 tablets (1,300 mg total) by mouth 3 (three) times daily. 06/11/22  Yes Dale Wellman, MD  cetirizine (ZYRTEC) 10 MG tablet Take by mouth.    [provider]  norethindrone (MICRONOR) 0.35 MG tablet Take 1 tablet (0.35 mg total) by mouth daily. 10/03/19 06/15/20  Dale Burke, MD    Allergies as of 07/16/2022 - Review Complete 07/16/2022  Allergen Reaction Noted   Sulfa antibiotics  09/16/2012    Family History  Problem Relation Age of Onset   Hypertension Father    Breast cancer Maternal Grandmother        63-70   Breast cancer Paternal Grandmother        78-70   Diabetes Other        parent   Colon  cancer Neg Hx     Social History   Socioeconomic History   Marital status: Married    Spouse name: Not on file   Number of children: 1   Years of education: Not on file   Highest education level: Not on file  Occupational History    Employer: [employer]  Tobacco Use   Smoking status: Never   Smokeless tobacco: Never  Vaping Use   Vaping Use: Never used  Substance and Sexual Activity   Alcohol use: No    Alcohol/week: 0.0 standard drinks of alcohol   Drug use: No   Sexual activity: Not on file  Other Topics Concern   Not on file  Social History Narrative   Lives in [place]; 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    Social Determinants of Health   Financial Resource Strain: Not on file  Food Insecurity: Not on file  Transportation Needs: Not on file  Physical Activity: Not on file  Stress: Not on file  Social Connections: Not on file  Intimate Partner Violence: Not on file    Review of Systems: See HPI, otherwise negative ROS  Physical Exam: BP 123/76   Pulse 73   Temp 98.1 F (36.7 C) (Temporal)   Ht 5\' 7"  (1.702 m)   Wt 96.2 kg   LMP 09/18/2022   SpO2 99%  BMI 33.20 kg/m  General:   Alert,  pleasant and cooperative in NAD Head:  Normocephalic and atraumatic. Neck:  Supple; no masses or thyromegaly. Lungs:  Clear throughout to auscultation.    Heart:  Regular rate and rhythm. Abdomen:  Soft, nontender and nondistended. Normal bowel sounds, without guarding, and without rebound.   Neurologic:  Alert and  oriented x4;  grossly normal neurologically.  Impression/Plan: Erica Hernandez is here for an endoscopy and colonoscopy to be performed for IDA  Risks, benefits, limitations, and alternatives regarding  endoscopy and colonoscopy have been reviewed with the patient.  Questions have been answered.  All parties agreeable.   Midge Minium, MD  09/26/2022, 10:31 AM

## 2022-09-26 NOTE — Op Note (Signed)
[facility] Patient Name: Erica Hernandez Procedure Date: 09/26/2022 10:58 AM MRN: [id number] Account #: [id number] Date of Birth: 1976/03/21 Admit Type: Outpatient Age: 46 Room: [facility] OR ROOM 01 Gender: Female Note Status: Finalized Instrument Name: [id number] Procedure:             Colonoscopy Indications:           Iron deficiency anemia Providers:             Midge Minium MD, MD Referring MD:          Midge Minium MD, MD (Referring MD), Dale Lewisberry, MD                         (Referring MD) Medicines:             Propofol per Anesthesia Complications:         No immediate complications. Procedure:             Pre-Anesthesia Assessment:                        - Prior to the procedure, a History and Physical was                         performed, and patient medications and allergies were                         reviewed. The patient's tolerance of previous                         anesthesia was also reviewed. The risks and benefits                         of the procedure and the sedation options and risks                         were discussed with the patient. All questions were                         answered, and informed consent was obtained. Prior                         Anticoagulants: The patient has taken no anticoagulant                         or antiplatelet agents. ASA Grade Assessment: II - A                         patient with mild systemic disease. After reviewing                         the risks and benefits, the patient was deemed in                         satisfactory condition to undergo the procedure.                        After obtaining informed consent, the colonoscope was  passed under direct vision. Throughout the procedure,                         the patient's blood pressure, pulse, and oxygen                         saturations were monitored continuously. The                          Colonoscope was introduced through the anus and                         advanced to the the cecum, identified by appendiceal                         orifice and ileocecal valve. The colonoscopy was                         performed without difficulty. The patient tolerated                         the procedure well. The quality of the bowel                         preparation was excellent. Findings:      The perianal and digital rectal examinations were normal.      The colon (entire examined portion) appeared normal. Impression:            - The entire examined colon is normal.                        - No specimens collected. Recommendation:        - Discharge patient to home.                        - Resume previous diet.                        - Continue present medications.                        - Repeat colonoscopy in 10 years for screening                         purposes. Procedure Code(s):     --- Professional ---                        [phone], Colonoscopy, flexible; diagnostic, including                         collection of specimen(s) by brushing or washing, when                         performed (separate procedure) Diagnosis Code(s):     --- Professional ---                        D50.9, Iron deficiency anemia, unspecified CPT copyright 2022 American Medical Association. All rights reserved. The codes documented in this report are preliminary and upon coder review  may  be revised to meet current compliance requirements. Midge Minium MD, MD 09/26/2022 11:26:54 AM This report has been signed electronically. Number of Addenda: 0 Note Initiated On: 09/26/2022 10:58 AM Scope Withdrawal Time: 0 hours 6 minutes 21 seconds  Total Procedure Duration: 0 hours 13 minutes 3 seconds  Estimated Blood Loss:  Estimated blood loss: none.      [facility]

## 2022-09-26 NOTE — Anesthesia Postprocedure Evaluation (Signed)
Anesthesia Post Note  Patient: Erica Hernandez  Procedure(s) Performed: COLONOSCOPY (Rectum) ESOPHAGOGASTRODUODENOSCOPY (EGD) (Mouth)  Patient location during evaluation: PACU Anesthesia Type: General Level of consciousness: awake and alert, oriented and patient cooperative Pain management: pain level controlled Vital Signs Assessment: post-procedure vital signs reviewed and stable Respiratory status: spontaneous breathing, nonlabored ventilation and respiratory function stable Cardiovascular status: blood pressure returned to baseline and stable Postop Assessment: adequate PO intake Anesthetic complications: no   No notable events documented.   Last Vitals:  Vitals:   09/26/22 1130 09/26/22 1138  BP: 118/71   Pulse: 70 71  Resp: (!) 21 16  Temp: 36.8 C   SpO2: 97% 97%    Last Pain:  Vitals:   09/26/22 1138  TempSrc:   PainSc: 0-No pain                 Reed Breech

## 2022-09-26 NOTE — Anesthesia Preprocedure Evaluation (Addendum)
Anesthesia Evaluation  Patient identified by MRN, date of birth, ID band Patient awake    Reviewed: Allergy & Precautions, NPO status , Patient's Chart, lab work & pertinent test results  History of Anesthesia Complications Negative for: history of anesthetic complications  Airway Mallampati: I   Neck ROM: Full    Dental  (+) Upper Dentures, Lower Dentures   Pulmonary neg pulmonary ROS   Pulmonary exam normal breath sounds clear to auscultation       Cardiovascular Exercise Tolerance: Good negative cardio ROS Normal cardiovascular exam Rhythm:Regular Rate:Normal     Neuro/Psych negative neurological ROS     GI/Hepatic hiatal hernia,GERD  ,,S/p duodenal switch   Endo/Other  Obesity   Renal/GU negative Renal ROS     Musculoskeletal   Abdominal   Peds  Hematology  (+) Blood dyscrasia, anemia   Anesthesia Other Findings   Reproductive/Obstetrics                             Anesthesia Physical Anesthesia Plan  ASA: 2  Anesthesia Plan: General   Post-op Pain Management:    Induction: Intravenous  PONV Risk Score and Plan: 3 and Propofol infusion, TIVA and Treatment may vary due to age or medical condition  Airway Management Planned: Natural Airway  Additional Equipment:   Intra-op Plan:   Post-operative Plan:   Informed Consent: I have reviewed the patients History and Physical, chart, labs and discussed the procedure including the risks, benefits and alternatives for the proposed anesthesia with the patient or authorized representative who has indicated his/her understanding and acceptance.       Plan Discussed with: CRNA  Anesthesia Plan Comments: (LMA/GETA backup discussed.  Patient consented for risks of anesthesia including but not limited to:  - adverse reactions to medications - damage to eyes, teeth, lips or other oral mucosa - nerve damage due to positioning   - sore throat or hoarseness - damage to heart, brain, nerves, lungs, other parts of body or loss of life  Informed patient about role of CRNA in peri- and intra-operative care.  Patient voiced understanding.)        Anesthesia Quick Evaluation

## 2022-09-26 NOTE — Transfer of Care (Signed)
Immediate Anesthesia Transfer of Care Note  Patient: Erica Hernandez  Procedure(s) Performed: COLONOSCOPY (Rectum) ESOPHAGOGASTRODUODENOSCOPY (EGD) (Mouth)  Patient Location: PACU  Anesthesia Type: General  Level of Consciousness: awake, alert  and patient cooperative  Airway and Oxygen Therapy: Patient Spontanous Breathing and Patient connected to supplemental oxygen  Post-op Assessment: Post-op Vital signs reviewed, Patient's Cardiovascular Status Stable, Respiratory Function Stable, Patent Airway and No signs of Nausea or vomiting  Post-op Vital Signs: Reviewed and stable  Complications: No notable events documented.

## 2022-09-26 NOTE — Op Note (Signed)
[facility] Gastroenterology Patient Name: Erica Hernandez Procedure Date: 09/26/2022 10:57 AM MRN: [id number] Account #: [id number] Date of Birth: Apr 08, 1976 Admit Type: Outpatient Age: 46 Room: [facility] OR ROOM 1 Gender: Female Note Status: Finalized Instrument Name: [id number] Procedure:             Upper GI endoscopy Indications:           Iron deficiency anemia Providers:             Midge Minium MD, MD Referring MD:          Midge Minium MD, MD (Referring MD), Dale Seaford, MD                         (Referring MD) Medicines:             Propofol per Anesthesia Complications:         No immediate complications. Procedure:             Pre-Anesthesia Assessment:                        - Prior to the procedure, a History and Physical was                         performed, and patient medications and allergies were                         reviewed. The patient's tolerance of previous                         anesthesia was also reviewed. The risks and benefits                         of the procedure and the sedation options and risks                         were discussed with the patient. All questions were                         answered, and informed consent was obtained. Prior                         Anticoagulants: The patient has taken no anticoagulant                         or antiplatelet agents. ASA Grade Assessment: II - A                         patient with mild systemic disease. After reviewing                         the risks and benefits, the patient was deemed in                         satisfactory condition to undergo the procedure.                        After obtaining informed consent, the endoscope was  passed under direct vision. Throughout the procedure,                         the patient's blood pressure, pulse, and oxygen                         saturations were monitored continuously. The Endoscope                          was introduced through the mouth, and advanced to the                         second part of duodenum. The upper GI endoscopy was                         accomplished without difficulty. The patient tolerated                         the procedure well. Findings:      The examined esophagus was normal.      Evidence of a sleeve gastrectomy was found in the entire examined       stomach. This was characterized by healthy appearing mucosa.      The examined duodenum was normal. Impression:            - Normal esophagus.                        - A sleeve gastrectomy was found, characterized by                         healthy appearing mucosa.                        - Normal examined duodenum.                        - No specimens collected. Recommendation:        - Discharge patient to home.                        - Resume previous diet.                        - Continue present medications.                        - Perform a colonoscopy today. Procedure Code(s):     --- Professional ---                        [phone], Esophagogastroduodenoscopy, flexible,                         transoral; diagnostic, including collection of                         specimen(s) by brushing or washing, when performed                         (separate procedure) Diagnosis Code(s):     --- Professional ---  D50.9, Iron deficiency anemia, unspecified CPT copyright 2022 American Medical Association. All rights reserved. The codes documented in this report are preliminary and upon coder review may  be revised to meet current compliance requirements. Midge Minium MD, MD 09/26/2022 11:10:17 AM This report has been signed electronically. Number of Addenda: 0 Note Initiated On: 09/26/2022 10:57 AM Estimated Blood Loss:  Estimated blood loss: none.      [facility]

## 2022-09-28 ENCOUNTER — Other Ambulatory Visit: Payer: Self-pay | Admitting: Internal Medicine

## 2022-09-28 DIAGNOSIS — K219 Gastro-esophageal reflux disease without esophagitis: Secondary | ICD-10-CM

## 2022-09-29 ENCOUNTER — Encounter: Payer: Self-pay | Admitting: Gastroenterology

## 2022-10-06 ENCOUNTER — Encounter: Payer: Self-pay | Admitting: Gastroenterology

## 2022-10-16 ENCOUNTER — Telehealth: Payer: Self-pay

## 2022-10-16 DIAGNOSIS — Z9884 Bariatric surgery status: Secondary | ICD-10-CM

## 2022-10-16 DIAGNOSIS — D509 Iron deficiency anemia, unspecified: Secondary | ICD-10-CM

## 2022-10-16 NOTE — Telephone Encounter (Signed)
Patient called stating that she believes her Hemoglobin is low and would like to come in tomorrow for lab work. Call back # [phone]

## 2022-10-17 NOTE — Telephone Encounter (Signed)
Please advise.  Patient reports feeling very tired and dizzy for 2 days.  Feels like her iron level has dropped.  Requesting to come in for lab check

## 2022-10-17 NOTE — Telephone Encounter (Signed)
Please schedule patient for lab/APP next week and inform pt of appt details.

## 2022-10-17 NOTE — Addendum Note (Signed)
Addended by: Guerry Minors on: 10/17/2022 09:55 AM   Modules accepted: Orders

## 2022-10-20 ENCOUNTER — Inpatient Hospital Stay: Payer: Managed Care, Other (non HMO) | Attending: Internal Medicine

## 2022-10-20 ENCOUNTER — Other Ambulatory Visit: Payer: Self-pay

## 2022-10-20 ENCOUNTER — Encounter: Payer: Self-pay | Admitting: Nurse Practitioner

## 2022-10-20 ENCOUNTER — Inpatient Hospital Stay (HOSPITAL_BASED_OUTPATIENT_CLINIC_OR_DEPARTMENT_OTHER): Payer: Managed Care, Other (non HMO) | Admitting: Nurse Practitioner

## 2022-10-20 VITALS — BP 113/74 | HR 79 | Temp 97.6°F | Resp 18 | Ht 67.0 in | Wt 223.0 lb

## 2022-10-20 DIAGNOSIS — N920 Excessive and frequent menstruation with regular cycle: Secondary | ICD-10-CM | POA: Insufficient documentation

## 2022-10-20 DIAGNOSIS — D509 Iron deficiency anemia, unspecified: Secondary | ICD-10-CM | POA: Diagnosis not present

## 2022-10-20 DIAGNOSIS — Z803 Family history of malignant neoplasm of breast: Secondary | ICD-10-CM | POA: Insufficient documentation

## 2022-10-20 DIAGNOSIS — K909 Intestinal malabsorption, unspecified: Secondary | ICD-10-CM | POA: Diagnosis present

## 2022-10-20 DIAGNOSIS — E538 Deficiency of other specified B group vitamins: Secondary | ICD-10-CM | POA: Diagnosis not present

## 2022-10-20 DIAGNOSIS — D508 Other iron deficiency anemias: Secondary | ICD-10-CM | POA: Insufficient documentation

## 2022-10-20 DIAGNOSIS — G4733 Obstructive sleep apnea (adult) (pediatric): Secondary | ICD-10-CM | POA: Insufficient documentation

## 2022-10-20 DIAGNOSIS — R5382 Chronic fatigue, unspecified: Secondary | ICD-10-CM | POA: Insufficient documentation

## 2022-10-20 LAB — IRON AND TIBC
Iron: 62 ug/dL (ref 28–170)
Saturation Ratios: 18 % (ref 10.4–31.8)
TIBC: 346 ug/dL (ref 250–450)
UIBC: 284 ug/dL

## 2022-10-20 LAB — CBC WITH DIFFERENTIAL/PLATELET
Abs Immature Granulocytes: 0.01 10*3/uL (ref 0.00–0.07)
Basophils Absolute: 0 10*3/uL (ref 0.0–0.1)
Basophils Relative: 1 %
Eosinophils Absolute: 0.1 10*3/uL (ref 0.0–0.5)
Eosinophils Relative: 2 %
HCT: 38.8 % (ref 36.0–46.0)
Hemoglobin: 13 g/dL (ref 12.0–15.0)
Immature Granulocytes: 0 %
Lymphocytes Relative: 29 %
Lymphs Abs: 1.8 10*3/uL (ref 0.7–4.0)
MCH: 31 pg (ref 26.0–34.0)
MCHC: 33.5 g/dL (ref 30.0–36.0)
MCV: 92.6 fL (ref 80.0–100.0)
Monocytes Absolute: 0.3 10*3/uL (ref 0.1–1.0)
Monocytes Relative: 5 %
Neutro Abs: 3.9 10*3/uL (ref 1.7–7.7)
Neutrophils Relative %: 63 %
Platelets: 233 10*3/uL (ref 150–400)
RBC: 4.19 MIL/uL (ref 3.87–5.11)
RDW: 13.6 % (ref 11.5–15.5)
WBC: 6.2 10*3/uL (ref 4.0–10.5)
nRBC: 0 % (ref 0.0–0.2)

## 2022-10-20 LAB — FERRITIN: Ferritin: 34 ng/mL (ref 11–307)

## 2022-10-20 NOTE — Progress Notes (Signed)
[place] CONSULT NOTE  Patient Care Team: Einar Pheasant, MD as PCP - General (Internal Medicine)  CHIEF COMPLAINTS/PURPOSE OF CONSULTATION: Iron Deficiency Anemia   HEMATOLOGY HISTORY:   # IRON DEF ANEMIA - malabsorption /heavy menstrual cycles. nadir 6.6; iron sat 2%; ferritin-4 [April 2021];  IV IRON / ferrahem x4 [last June 2021; [facility]]; AUG 2021- B12->1000.  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   # Chronic fatigue- Dx: OSA; intolerant to CPAP; 2022-sleep study negative.    HISTORY OF PRESENTING ILLNESS: Alone. Ambulating independently.  Erica Hernandez 46 y.o. female with iron deficiency sec to malabsorption/duodenal switch and menorrhagia returns to clinic for re-evaluation. She was evaluated by Dr. Darrall Dears last month and iron stores were well replenished. Since that time she complains of dizziness and fatigue. She has stopped acid reflux medication and questions retrial of oral iron. Just finished menstrual period which lasts approximately 5 days. Not heavy. Takes lysteda to help stop bleeding. Not currently on birth control. Does not desire conception or fertility. She's had colonoscopy and is possibly going to be undergoing capsule study. No recent illnesses.   Review of Systems  Constitutional:  Negative for chills, diaphoresis, fever and weight loss.  HENT:  Negative for congestion, ear discharge, ear pain, nosebleeds, sinus pain and sore throat.   Eyes:  Negative for pain and redness.  Respiratory:  Negative for cough, hemoptysis, sputum production, shortness of breath, wheezing and stridor.   Cardiovascular:  Negative for chest pain, palpitations and leg swelling.  Gastrointestinal:  Negative for abdominal pain, constipation, diarrhea, nausea and vomiting.  Musculoskeletal:  Negative for falls, joint pain and myalgias.  Skin:  Negative for rash.  Neurological:  Negative for dizziness, loss of consciousness,  weakness and headaches.  Psychiatric/Behavioral:  Negative for depression. The patient is not nervous/anxious and does not have insomnia.   All other systems reviewed and are negative.   MEDICAL HISTORY:  Past Medical History:  Diagnosis Date   Hypertension    during pregnancy    SURGICAL HISTORY: Past Surgical History:  Procedure Laterality Date   COLONOSCOPY N/A 09/26/2022   Procedure: COLONOSCOPY;  Surgeon: Lucilla Lame, MD;  Location: [place];  Service: Endoscopy;  Laterality: N/A;   ESOPHAGOGASTRODUODENOSCOPY N/A 09/26/2022   Procedure: ESOPHAGOGASTRODUODENOSCOPY (EGD);  Surgeon: Lucilla Lame, MD;  Location: [place];  Service: Endoscopy;  Laterality: N/A;   HERNIA REPAIR     SHOULDER SURGERY  03/30/11   TONSILLECTOMY  1984    SOCIAL HISTORY: Social History   Socioeconomic History   Marital status: Married    Spouse name: Not on file   Number of children: 1   Years of education: Not on file   Highest education level: Not on file  Occupational History    Employer: [employer]  Tobacco Use   Smoking status: Never   Smokeless tobacco: Never  Vaping Use   Vaping Use: Never used  Substance and Sexual Activity   Alcohol use: No    Alcohol/week: 0.0 standard drinks of alcohol   Drug use: No   Sexual activity: Not on file  Other Topics Concern   Not on file  Social History Narrative   Lives in [place]; Network engineer- garage; never smoked; rare alcohol.    Social Determinants of Health   Financial Resource Strain: Not on file  Food Insecurity: Not on file  Transportation Needs: Not on file  Physical Activity: Not on file  Stress: Not on file  Social Connections: Not on file  Intimate Partner Violence: Not on file    FAMILY HISTORY: Family History  Problem Relation Age of Onset   Hypertension Father    Breast cancer Maternal Grandmother        60-70   Breast cancer Paternal Grandmother        52-70   Diabetes Other        parent    Colon cancer Neg Hx     ALLERGIES:  is allergic to sulfa antibiotics.  MEDICATIONS:  Current Outpatient Medications  Medication Sig Dispense Refill   Biotin 10 MG CAPS Take by mouth.     Calcium Carbonate-Vitamin D3 600-400 MG-UNIT TABS Take 1 mg by mouth daily.     cetirizine (ZYRTEC) 10 MG tablet Take by mouth.     [employer] FIBER GUMMIES PO Take by mouth.     tranexamic acid (LYSTEDA) 650 MG TABS tablet Take 2 tablets (1,300 mg total) by mouth 3 (three) times daily. 30 tablet 3   ciclopirox (PENLAC) 8 % solution Apply topically at bedtime. Apply over nail and surrounding skin. Apply daily over previous coat. After seven (7) days, may remove with alcohol and continue cycle. (Patient not taking: Reported on 10/20/2022) 6.6 mL 1   No current facility-administered medications for this visit.      PHYSICAL EXAMINATION:   Vitals:   10/20/22 1130  BP: 113/74  Pulse: 79  Resp: 18  Temp: 97.6 F (36.4 C)   Filed Weights   10/20/22 1133  Weight: 223 lb (101.2 kg)    Physical Exam HENT:     Head: Normocephalic and atraumatic.     Mouth/Throat:     Pharynx: No oropharyngeal exudate.  Eyes:     Pupils: Pupils are equal, round, and reactive to light.  Cardiovascular:     Rate and Rhythm: Normal rate and regular rhythm.  Pulmonary:     Effort: Pulmonary effort is normal. No respiratory distress.     Breath sounds: Normal breath sounds. No wheezing.  Abdominal:     General: Bowel sounds are normal. There is no distension.     Palpations: Abdomen is soft. There is no mass.     Tenderness: There is no abdominal tenderness. There is no guarding or rebound.  Musculoskeletal:        General: No tenderness. Normal range of motion.     Cervical back: Normal range of motion and neck supple.  Skin:    General: Skin is warm.  Neurological:     Mental Status: She is alert and oriented to person, place, and time.  Psychiatric:        Mood and Affect: Affect normal.     LABORATORY  DATA:  I have reviewed the data as listed Lab Results  Component Value Date   WBC 6.2 10/20/2022   HGB 13.0 10/20/2022   HCT 38.8 10/20/2022   MCV 92.6 10/20/2022   PLT 233 10/20/2022   Recent Labs    02/28/22 1238 03/03/22 0814 03/03/22 0814 03/24/22 0732 07/16/22 1001 09/18/22 1340  NA 136  --   --   --   --  137  K 4.0  --   --   --   --  4.0  CL 106  --   --   --   --  105  CO2 26  --   --   --   --  26  GLUCOSE 100*  --   --   --   --  87  BUN 18  --   --   --   --  22*  CREATININE 0.82  --   --   --   --  0.74  CALCIUM 8.6*  --   --   --   --  9.1  GFRNONAA >60  --   --   --   --  >60  PROT  --  6.2   < > 6.2 6.7 6.9  ALBUMIN  --  3.8   < > 3.8 4.6 4.1  AST  --  40*   < > 35 32 24  ALT  --  63*   < > 58* 57* 30  ALKPHOS  --  111   < > 89 100 80  BILITOT  --  0.5   < > 0.4 0.3 0.2*  BILIDIR  --  0.1  --  0.0 0.10  --    < > = values in this interval not displayed.  Iron/TIBC/Ferritin/ %Sat    Component Value Date/Time   IRON 62 10/20/2022 1051   TIBC 346 10/20/2022 1051   FERRITIN 34 10/20/2022 1051   IRONPCTSAT 18 10/20/2022 1051      No results found.   Assessment & Plan:  No problem-specific Assessment & Plan notes found for this encounter.  #Iron deficiency anemia-multifactorial/malabsorption-bariatric surgery; heavy menstrual cycles. Completed IV venofer x 5, lst 08/29/22. Repeat iron panel showed excellent response however, symptoms suspicious for drop in ferritin in interim. Labs pending at time of visit but have resulted. Ferritin has dropped from 70s to 30s in past month. Symptomatic. Plan for venofer x 2. Follow up as scheduled. Patient wishes to re-trial oral iron supplements to assess tolerance and absorption now that she has stopped PPIs. Discussed options for otc iron. Plan to follow counts as planned.   # b12 deficiency- elevated. Recheck next visit. Hold b12 supplementation.   # Menorrhagia- not currently on birth control. Recommend  evaluation with gyn for discussion of contraception and management of menorrhagia.      # DISPOSITION: Venofer x 2 Follow up as scheduled- la  All questions were answered. The patient knows to call the clinic with any problems, questions or concerns.   Verlon Au, NP 10/20/2022

## 2022-10-22 ENCOUNTER — Other Ambulatory Visit: Payer: Self-pay

## 2022-10-22 ENCOUNTER — Encounter: Payer: Self-pay | Admitting: Nurse Practitioner

## 2022-10-22 DIAGNOSIS — D509 Iron deficiency anemia, unspecified: Secondary | ICD-10-CM

## 2022-10-24 ENCOUNTER — Inpatient Hospital Stay: Payer: Managed Care, Other (non HMO)

## 2022-10-24 VITALS — BP 114/69 | HR 74 | Temp 97.6°F | Resp 18

## 2022-10-24 DIAGNOSIS — D509 Iron deficiency anemia, unspecified: Secondary | ICD-10-CM

## 2022-10-24 DIAGNOSIS — E611 Iron deficiency: Secondary | ICD-10-CM

## 2022-10-24 DIAGNOSIS — D508 Other iron deficiency anemias: Secondary | ICD-10-CM | POA: Diagnosis not present

## 2022-10-24 MED ORDER — SODIUM CHLORIDE 0.9 % IV SOLN
200.0000 mg | Freq: Once | INTRAVENOUS | Status: AC
  Administered 2022-10-24: 200 mg via INTRAVENOUS
  Filled 2022-10-24: qty 200

## 2022-10-24 MED ORDER — SODIUM CHLORIDE 0.9 % IV SOLN
Freq: Once | INTRAVENOUS | Status: AC
  Filled 2022-10-24: qty 250

## 2022-10-31 ENCOUNTER — Inpatient Hospital Stay: Payer: Managed Care, Other (non HMO)

## 2022-10-31 VITALS — BP 105/73 | HR 80 | Temp 97.0°F

## 2022-10-31 DIAGNOSIS — D508 Other iron deficiency anemias: Secondary | ICD-10-CM | POA: Diagnosis not present

## 2022-10-31 DIAGNOSIS — E611 Iron deficiency: Secondary | ICD-10-CM

## 2022-10-31 DIAGNOSIS — D509 Iron deficiency anemia, unspecified: Secondary | ICD-10-CM

## 2022-10-31 MED ORDER — SODIUM CHLORIDE 0.9 % IV SOLN
200.0000 mg | Freq: Once | INTRAVENOUS | Status: AC
  Administered 2022-10-31: 200 mg via INTRAVENOUS
  Filled 2022-10-31: qty 200

## 2022-10-31 MED ORDER — SODIUM CHLORIDE 0.9 % IV SOLN
Freq: Once | INTRAVENOUS | Status: AC
  Filled 2022-10-31: qty 250

## 2022-11-06 ENCOUNTER — Ambulatory Visit
Admission: RE | Admit: 2022-11-06 | Discharge: 2022-11-06 | Disposition: A | Discharge status: Home / Self Care | Payer: Managed Care, Other (non HMO) | Attending: Gastroenterology | Admitting: Gastroenterology

## 2022-11-06 ENCOUNTER — Encounter: Payer: Self-pay | Admitting: Anesthesiology

## 2022-11-06 ENCOUNTER — Encounter
Admission: RE | Disposition: A | Discharge status: Home / Self Care | Payer: Self-pay | Source: Home / Self Care | Attending: Gastroenterology

## 2022-11-06 DIAGNOSIS — Z029 Encounter for administrative examinations, unspecified: Secondary | ICD-10-CM | POA: Diagnosis present

## 2022-11-06 HISTORY — PX: GIVENS CAPSULE STUDY: SHX5432

## 2022-11-06 SURGERY — IMAGING PROCEDURE, GI TRACT, INTRALUMINAL, VIA CAPSULE

## 2022-11-07 ENCOUNTER — Encounter: Payer: Self-pay | Admitting: Gastroenterology

## 2022-11-26 ENCOUNTER — Ambulatory Visit: Payer: Managed Care, Other (non HMO) | Admitting: Internal Medicine

## 2022-11-26 ENCOUNTER — Other Ambulatory Visit (HOSPITAL_COMMUNITY)
Admission: RE | Admit: 2022-11-26 | Discharge: 2022-11-26 | Disposition: A | Discharge status: Home / Self Care | Payer: Managed Care, Other (non HMO) | Source: Ambulatory Visit | Attending: Internal Medicine | Admitting: Internal Medicine

## 2022-11-26 ENCOUNTER — Encounter: Payer: Self-pay | Admitting: Internal Medicine

## 2022-11-26 VITALS — BP 110/70 | HR 78 | Temp 97.8°F | Resp 16 | Ht 67.0 in | Wt 223.8 lb

## 2022-11-26 DIAGNOSIS — Z23 Encounter for immunization: Secondary | ICD-10-CM

## 2022-11-26 DIAGNOSIS — R5383 Other fatigue: Secondary | ICD-10-CM | POA: Diagnosis not present

## 2022-11-26 DIAGNOSIS — I10 Essential (primary) hypertension: Secondary | ICD-10-CM | POA: Diagnosis not present

## 2022-11-26 DIAGNOSIS — K760 Fatty (change of) liver, not elsewhere classified: Secondary | ICD-10-CM

## 2022-11-26 DIAGNOSIS — Z124 Encounter for screening for malignant neoplasm of cervix: Secondary | ICD-10-CM

## 2022-11-26 DIAGNOSIS — D509 Iron deficiency anemia, unspecified: Secondary | ICD-10-CM | POA: Diagnosis not present

## 2022-11-26 DIAGNOSIS — F439 Reaction to severe stress, unspecified: Secondary | ICD-10-CM

## 2022-11-26 DIAGNOSIS — K219 Gastro-esophageal reflux disease without esophagitis: Secondary | ICD-10-CM

## 2022-11-26 LAB — CBC WITH DIFFERENTIAL/PLATELET
Basophils Absolute: 0 10*3/uL (ref 0.0–0.1)
Basophils Relative: 0.6 % (ref 0.0–3.0)
Eosinophils Absolute: 0.1 10*3/uL (ref 0.0–0.7)
Eosinophils Relative: 1.8 % (ref 0.0–5.0)
HCT: 39.3 % (ref 36.0–46.0)
Hemoglobin: 13.5 g/dL (ref 12.0–15.0)
Lymphocytes Relative: 31.9 % (ref 12.0–46.0)
Lymphs Abs: 1.4 10*3/uL (ref 0.7–4.0)
MCHC: 34.3 g/dL (ref 30.0–36.0)
MCV: 93.5 fl (ref 78.0–100.0)
Monocytes Absolute: 0.3 10*3/uL (ref 0.1–1.0)
Monocytes Relative: 7.1 % (ref 3.0–12.0)
Neutro Abs: 2.6 10*3/uL (ref 1.4–7.7)
Neutrophils Relative %: 58.6 % (ref 43.0–77.0)
Platelets: 231 10*3/uL (ref 150.0–400.0)
RBC: 4.2 Mil/uL (ref 3.87–5.11)
RDW: 13.7 % (ref 11.5–15.5)
WBC: 4.4 10*3/uL (ref 4.0–10.5)

## 2022-11-26 LAB — BASIC METABOLIC PANEL
BUN: 16 mg/dL (ref 6–23)
CO2: 27 mEq/L (ref 19–32)
Calcium: 8.8 mg/dL (ref 8.4–10.5)
Chloride: 106 mEq/L (ref 96–112)
Creatinine, Ser: 0.73 mg/dL (ref 0.40–1.20)
GFR: 99.9 mL/min (ref >60.00)
Glucose, Bld: 82 mg/dL (ref 70–99)
Potassium: 4.3 mEq/L (ref 3.5–5.1)
Sodium: 139 mEq/L (ref 135–145)

## 2022-11-26 LAB — HEPATIC FUNCTION PANEL
ALT: 50 U/L — ABNORMAL HIGH (ref 0–35)
AST: 30 U/L (ref 0–37)
Albumin: 4.3 g/dL (ref 3.5–5.2)
Alkaline Phosphatase: 77 U/L (ref 39–117)
Bilirubin, Direct: 0.1 mg/dL (ref 0.0–0.3)
Total Bilirubin: 0.5 mg/dL (ref 0.2–1.2)
Total Protein: 6.7 g/dL (ref 6.0–8.3)

## 2022-11-26 LAB — LIPID PANEL
Cholesterol: 137 mg/dL (ref 0–200)
HDL: 55.5 mg/dL (ref >39.00)
LDL Cholesterol: 70 mg/dL (ref 0–99)
NonHDL: 81.65
Total CHOL/HDL Ratio: 2
Triglycerides: 57 mg/dL (ref 0.0–149.0)
VLDL: 11.4 mg/dL (ref 0.0–40.0)

## 2022-11-26 LAB — IBC + FERRITIN
Ferritin: 50.4 ng/mL (ref 10.0–291.0)
Iron: 71 ug/dL (ref 42–145)
Saturation Ratios: 18.9 % — ABNORMAL LOW (ref 20.0–50.0)
TIBC: 376.6 ug/dL (ref 250.0–450.0)
Transferrin: 269 mg/dL (ref 212.0–360.0)

## 2022-11-26 LAB — TSH: TSH: 1.78 u[IU]/mL (ref 0.35–5.50)

## 2022-11-26 NOTE — Progress Notes (Signed)
Subjective:    Patient ID: Erica Hernandez, female    DOB: 25-Jan-1976, 47 y.o.   MRN: [id number]  Patient here for  Chief Complaint  Patient presents with   Medical Management of Chronic Issues   Anemia    HPI Here for scheduled follow up regarding hypertension and anemia.  Also here for pap smear. History of bariatric surgery and hiatal hernia repair.  Has IDA.  Seeing Dr Rogue Bussing.  S/p iron infusions. Seeing GI (Dr Allen Norris).  S/p EGD 09/26/22 - Normal esophagus. A sleeve gastrectomy was found, characterized by healthy appearing mucosa. Normal examined duodenum. Colonoscopy 09/26/22 - entire colon normal. S/p capsule study 11/06/22.  Results pending.  Had covid in the fall.  No residual problems.  No cough or congestion.  No chest pain or sob.  No abdominal pain.  Bowels moving.  Does report increased fatigue.  Feels blood count is down.  Request labs today to check hgb and iron.    Past Medical History:  Diagnosis Date   Hypertension    during pregnancy   Past Surgical History:  Procedure Laterality Date   COLONOSCOPY N/A 09/26/2022   Procedure: COLONOSCOPY;  Surgeon: Lucilla Lame, MD;  Location: [place];  Service: Endoscopy;  Laterality: N/A;   ESOPHAGOGASTRODUODENOSCOPY N/A 09/26/2022   Procedure: ESOPHAGOGASTRODUODENOSCOPY (EGD);  Surgeon: Lucilla Lame, MD;  Location: [place];  Service: Endoscopy;  Laterality: N/A;   GIVENS CAPSULE STUDY N/A 11/06/2022   Procedure: GIVENS CAPSULE STUDY;  Surgeon: Lucilla Lame, MD;  Location: [facility] ENDOSCOPY;  Service: Endoscopy;  Laterality: N/A;   HERNIA REPAIR     SHOULDER SURGERY  03/30/11   TONSILLECTOMY  1984   Family History  Problem Relation Age of Onset   Hypertension Father    Breast cancer Maternal Grandmother        36-70   Breast cancer Paternal Grandmother        42-70   Diabetes Other        parent   Colon cancer Neg Hx    Social History   Socioeconomic History   Marital status: Married    Spouse  name: Not on file   Number of children: 1   Years of education: Not on file   Highest education level: Not on file  Occupational History    Employer: [employer]  Tobacco Use   Smoking status: Never   Smokeless tobacco: Never  Vaping Use   Vaping Use: Never used  Substance and Sexual Activity   Alcohol use: No    Alcohol/week: 0.0 standard drinks of alcohol   Drug use: No   Sexual activity: Not on file  Other Topics Concern   Not on file  Social History Narrative   Lives in [place]; Network engineer- garage; never smoked; rare alcohol.    Social Determinants of Health   Financial Resource Strain: Not on file  Food Insecurity: Not on file  Transportation Needs: Not on file  Physical Activity: Not on file  Stress: Not on file  Social Connections: Not on file     Review of Systems  Constitutional:  Positive for fatigue. Negative for appetite change and unexpected weight change.  HENT:  Negative for congestion and sinus pressure.   Respiratory:  Negative for cough, chest tightness and shortness of breath.   Cardiovascular:  Negative for chest pain, palpitations and leg swelling.  Gastrointestinal:  Negative for abdominal pain, diarrhea, nausea and vomiting.  Genitourinary:  Negative for difficulty urinating and dysuria.  Musculoskeletal:  Negative for joint swelling and myalgias.  Skin:  Negative for color change and rash.  Neurological:  Negative for dizziness and headaches.  Psychiatric/Behavioral:  Negative for agitation and dysphoric mood.        Objective:     BP 110/70 (BP Location: Left Arm, Patient Position: Sitting, Cuff Size: Normal)   Pulse 78   Temp 97.8 F (36.6 C) (Oral)   Resp 16   Ht 5\' 7"  (1.702 m)   Wt 223 lb 12.8 oz (101.5 kg)   SpO2 99%   BMI 35.05 kg/m  Wt Readings from Last 3 Encounters:  11/26/22 223 lb 12.8 oz (101.5 kg)  10/20/22 223 lb (101.2 kg)  09/26/22 212 lb (96.2 kg)    Physical Exam Vitals reviewed.  Constitutional:       General: She is not in acute distress.    Appearance: Normal appearance. She is well-developed.  HENT:     Head: Normocephalic and atraumatic.     Right Ear: External ear normal.     Left Ear: External ear normal.  Eyes:     General: No scleral icterus.       Right eye: No discharge.        Left eye: No discharge.     Conjunctiva/sclera: Conjunctivae normal.  Neck:     Thyroid: No thyromegaly.  Cardiovascular:     Rate and Rhythm: Normal rate and regular rhythm.  Pulmonary:     Effort: No tachypnea, accessory muscle usage or respiratory distress.     Breath sounds: Normal breath sounds. No decreased breath sounds or wheezing.  Chest:  Breasts:    Right: No inverted nipple, mass, nipple discharge or tenderness (no axillary adenopathy).     Left: No inverted nipple, mass, nipple discharge or tenderness (no axilarry adenopathy).  Abdominal:     General: Bowel sounds are normal.     Palpations: Abdomen is soft.     Tenderness: There is no abdominal tenderness.  Genitourinary:    Comments: Normal external genitalia.  Vaginal vault without lesions.  Cervix identified.  Pap smear performed.  Could not appreciate any adnexal masses or tenderness.   Musculoskeletal:        General: No swelling or tenderness.     Cervical back: Neck supple.  Lymphadenopathy:     Cervical: No cervical adenopathy.  Skin:    Findings: No erythema or rash.  Neurological:     Mental Status: She is alert and oriented to person, place, and time.  Psychiatric:        Mood and Affect: Mood normal.        Behavior: Behavior normal.      Outpatient Encounter Medications as of 11/26/2022  Medication Sig   Biotin 10 MG CAPS Take by mouth.   Calcium Carbonate-Vitamin D3 600-400 MG-UNIT TABS Take 1 mg by mouth daily.   cetirizine (ZYRTEC) 10 MG tablet Take by mouth.   [employer] FIBER GUMMIES PO Take by mouth.   tranexamic acid (LYSTEDA) 650 MG TABS tablet Take 2 tablets (1,300 mg total) by mouth 3 (three) times  daily.   [DISCONTINUED] ciclopirox (PENLAC) 8 % solution Apply topically at bedtime. Apply over nail and surrounding skin. Apply daily over previous coat. After seven (7) days, may remove with alcohol and continue cycle. (Patient not taking: Reported on 10/20/2022)   No facility-administered encounter medications on file as of 11/26/2022.     Lab Results  Component Value Date   WBC 4.4 11/26/2022  HGB 13.5 11/26/2022   HCT 39.3 11/26/2022   PLT 231.0 11/26/2022   GLUCOSE 82 11/26/2022   CHOL 137 11/26/2022   TRIG 57.0 11/26/2022   HDL 55.50 11/26/2022   LDLCALC 70 11/26/2022   ALT 50 (H) 11/26/2022   AST 30 11/26/2022   NA 139 11/26/2022   K 4.3 11/26/2022   CL 106 11/26/2022   CREATININE 0.73 11/26/2022   BUN 16 11/26/2022   CO2 27 11/26/2022   TSH 1.78 11/26/2022   HGBA1C 5.0 03/03/2022    No results found.     Assessment & Plan:  Iron deficiency anemia, unspecified iron deficiency anemia type Assessment & Plan: History of bariatric surgery.   Seeing Dr. Brahmanday/hematology.  Has received IV iron.  Follow cbc and iron studies. Just had GI w/up as outlined.  Capsule study - results pending.  Reports increased fatigue.  Feels iron/hgb low.  Check labs today.   Orders: -     CBC with Differential/Platelet -     IBC + Ferritin  Fatty liver Assessment & Plan: Has lost weight.  Discussed diet and exercise.  Follow liver function tests. Needs hepatitis vaccine.  First given today.   Orders: -     Hepatic function panel  Hypertension, essential Assessment & Plan: Blood pressure doing well on no medication.  Follow.   Orders: -     Lipid panel -     Basic metabolic panel  Cervical cancer screening Assessment & Plan: PAP today.   Orders: -     Cytology - PAP  Other fatigue Assessment & Plan: Discussed.  Reports increased fatigue.  Feels counts are down.  Being followed by hematology.  Has received IV iron infusions.  Check cbc and iron studies today.     Orders: -     TSH  Need for prophylactic vaccination against hepatitis A and hepatitis B -     Hepatitis A hepatitis B combined vaccine IM  Need for immunization against influenza -     Flu Vaccine QUAD 61mo+IM (Fluarix, Fluzone & Alfiuria Quad PF)  Gastroesophageal reflux disease without esophagitis Assessment & Plan: She is status post duodenal switch.  Is status post hiatal hernia repair February 2022.  Followed by Dr. Darnell Level.  No swallowing problems now.  No acid reflux reported. Just had GI studies as outlined.     Stress Assessment & Plan: Overall appears to be handling things well.  Follow.       Einar Pheasant, MD

## 2022-11-27 ENCOUNTER — Encounter: Payer: Self-pay | Admitting: Nurse Practitioner

## 2022-11-28 ENCOUNTER — Encounter: Payer: Self-pay | Admitting: Internal Medicine

## 2022-11-28 ENCOUNTER — Telehealth: Payer: Self-pay | Admitting: Nurse Practitioner

## 2022-11-28 ENCOUNTER — Encounter: Payer: Self-pay | Admitting: Emergency Medicine

## 2022-11-28 ENCOUNTER — Ambulatory Visit
Admission: EM | Admit: 2022-11-28 | Discharge: 2022-11-28 | Disposition: A | Discharge status: Home / Self Care | Payer: Managed Care, Other (non HMO) | Attending: Physician Assistant | Admitting: Physician Assistant

## 2022-11-28 DIAGNOSIS — T50B95A Adverse effect of other viral vaccines, initial encounter: Secondary | ICD-10-CM | POA: Diagnosis not present

## 2022-11-28 DIAGNOSIS — R519 Headache, unspecified: Secondary | ICD-10-CM | POA: Diagnosis present

## 2022-11-28 DIAGNOSIS — R0789 Other chest pain: Secondary | ICD-10-CM | POA: Diagnosis present

## 2022-11-28 DIAGNOSIS — R5383 Other fatigue: Secondary | ICD-10-CM

## 2022-11-28 DIAGNOSIS — Z1152 Encounter for screening for COVID-19: Secondary | ICD-10-CM | POA: Insufficient documentation

## 2022-11-28 DIAGNOSIS — Z862 Personal history of diseases of the blood and blood-forming organs and certain disorders involving the immune mechanism: Secondary | ICD-10-CM | POA: Diagnosis not present

## 2022-11-28 LAB — SARS CORONAVIRUS 2 BY RT PCR: SARS Coronavirus 2 by RT PCR: NEGATIVE

## 2022-11-28 NOTE — Assessment & Plan Note (Addendum)
Has lost weight.  Discussed diet and exercise.  Follow liver function tests. Needs hepatitis vaccine.  First given today.

## 2022-11-28 NOTE — Assessment & Plan Note (Addendum)
She is status post duodenal switch.  Is status post hiatal hernia repair February 2022.  Followed by Dr. Darnell Level.  No swallowing problems now.  No acid reflux reported. Just had GI studies as outlined.

## 2022-11-28 NOTE — Assessment & Plan Note (Signed)
Overall appears to be handling things well.  Follow.  ?

## 2022-11-28 NOTE — Discharge Instructions (Signed)
-  I reviewed all of your labs you had done a couple days ago.  They look good. - We checked an EKG today which is normal and your COVID test was also negative. - Some your symptoms are likely related to the 2 vaccines that you received.  Try to get some rest and increase your fluids and take Tylenol for headaches and discomfort. - Follow-up with your hematologist in regards to your anemia and your PCP if you continue to have fatigue. - Go to ER if your chest pressure worsens or you have any palpitations, increased dizziness, feel faint, etc.

## 2022-11-28 NOTE — Telephone Encounter (Signed)
Spoke to patient by phone. Reports fatigue, headache, dizzy episodes. Was seen in UC and was covid negative. Reviewed labs which are not consistent with iron deficiency. Etiology unclear.

## 2022-11-28 NOTE — Assessment & Plan Note (Signed)
Discussed.  Reports increased fatigue.  Feels counts are down.  Being followed by hematology.  Has received IV iron infusions.  Check cbc and iron studies today.

## 2022-11-28 NOTE — ED Provider Notes (Addendum)
[facility]    CSN: [id number] Arrival date & time: 11/28/22  0815      History   Chief Complaint Chief Complaint  Patient presents with   Dizziness   Fatigue    HPI Erica Hernandez is a 47 y.o. female presenting for increased dizziness, headaches, fatigue, chest pressure over the past 2 days.  Patient reports she was seen her primary care office to discuss the fatigue 2 days ago.  She does have a history of iron deficiency anemia and follows up for regular infusions.  Her iron levels were essentially normal when evaluated a couple days ago.  Patient reports that she received hepatitis vaccine and flu vaccine 2 days ago.  Since then she has felt a little worse.  She says that almost feels like she has "a cold."  She says the chest pressure is minimal and she rates it at about 3 out of 10.  It does not radiate.  She denies any shortness of breath, palpitations.  Patient reports that she was not feeling very well before she had the vaccines but she feels little worse since.  She did have multiple labs performed 2 days ago which include CBC, BMP, hepatic function panel, lipid panel, TSH, IBC and ferritin as well as Pap smear on 11/26/2022.  She denies any fevers, cough, congestion, sore throat, abdominal pain, vomiting or diarrhea.  Medical history significant for hypertension, iron deficiency anemia, and obesity, status post bariatric surgery 4 years ago.  HPI  Past Medical History:  Diagnosis Date   Hypertension    during pregnancy    Patient Active Problem List   Diagnosis Date Noted   Toenail fungus 09/22/2021   Fatigue 09/17/2021   Tail bone pain 11/17/2020   Dysphagia 11/17/2020   Gastroesophageal reflux disease without esophagitis 10/11/2020   Fatty liver 10/11/2020   Iron deficiency 07/18/2020   Menstrual irregularity 03/10/2020   Cervical cancer screening 09/04/2019   Encounter for birth control 09/04/2019   Anemia 08/31/2019   History of bariatric surgery  08/29/2019   Skin lesion 01/22/2019   Breast lesion 10/14/2015   Health care maintenance 10/14/2015   Abdominal cyst 04/23/2015   Hypertension, essential 11/19/2014   Ear pain 10/12/2014   Stress 07/23/2014   Fullness in head 07/23/2014   Headache(784.0) 03/20/2014   Rectal irritation 03/20/2014   Sinusitis 01/15/2014    Past Surgical History:  Procedure Laterality Date   COLONOSCOPY N/A 09/26/2022   Procedure: COLONOSCOPY;  Surgeon: Midge Minium, MD;  Location: [facility];  Service: Endoscopy;  Laterality: N/A;   ESOPHAGOGASTRODUODENOSCOPY N/A 09/26/2022   Procedure: ESOPHAGOGASTRODUODENOSCOPY (EGD);  Surgeon: Midge Minium, MD;  Location: [facility];  Service: Endoscopy;  Laterality: N/A;   GIVENS CAPSULE STUDY N/A 11/06/2022   Procedure: GIVENS CAPSULE STUDY;  Surgeon: Midge Minium, MD;  Location: [facility];  Service: Endoscopy;  Laterality: N/A;   HERNIA REPAIR     SHOULDER SURGERY  03/30/11   TONSILLECTOMY  1984    OB History   No obstetric history on file.      Home Medications    Prior to Admission medications   Medication Sig Start Date End Date Taking? Authorizing Provider  Biotin 10 MG CAPS Take by mouth.   Yes [provider]  Calcium Carbonate-Vitamin D3 600-400 MG-UNIT TABS Take 1 mg by mouth daily.   Yes [provider]  cetirizine (ZYRTEC) 10 MG tablet Take by mouth.   Yes [provider]  [employer] FIBER  GUMMIES PO Take by mouth.   Yes [provider]  tranexamic acid (LYSTEDA) 650 MG TABS tablet Take 2 tablets (1,300 mg total) by mouth 3 (three) times daily. 06/11/22   Einar Pheasant, MD    Family History Family History  Problem Relation Age of Onset   Hypertension Father    Breast cancer Maternal Grandmother        20-70   Breast cancer Paternal Grandmother        46-70   Diabetes Other        parent   Colon cancer Neg Hx     Social History Social History   Tobacco Use   Smoking status:  Never   Smokeless tobacco: Never  Vaping Use   Vaping Use: Never used  Substance Use Topics   Alcohol use: No    Alcohol/week: 0.0 standard drinks of alcohol   Drug use: No     Allergies   Sulfa antibiotics   Review of Systems Review of Systems  Constitutional:  Positive for fatigue. Negative for fever.  HENT:  Negative for congestion, rhinorrhea and sore throat.   Respiratory:  Negative for cough and shortness of breath.   Cardiovascular:  Positive for chest pain ("pressure 3/10"). Negative for palpitations.  Gastrointestinal:  Negative for abdominal pain, diarrhea, nausea and vomiting.  Musculoskeletal:  Negative for arthralgias and myalgias.  Neurological:  Positive for dizziness and headaches. Negative for syncope and weakness.     Physical Exam Triage Vital Signs ED Triage Vitals  Enc Vitals Group     BP      Pulse      Resp      Temp      Temp src      SpO2      Weight      Height      Head Circumference      Peak Flow      Pain Score      Pain Loc      Pain Edu?      Excl. in [place]?    No data found.  Updated Vital Signs BP 126/82 (BP Location: Left Arm)   Pulse 70   Temp 98.4 F (36.9 C) (Oral)   Resp 16   Ht 5\' 7"  (1.702 m)   Wt 223 lb 12.3 oz (101.5 kg)   LMP 11/28/2022 (Exact Date)   SpO2 98%   BMI 35.05 kg/m    Physical Exam Vitals and nursing note reviewed.  Constitutional:      General: She is not in acute distress.    Appearance: Normal appearance. She is not ill-appearing or toxic-appearing.  HENT:     Head: Normocephalic and atraumatic.     Nose: Nose normal.     Mouth/Throat:     Mouth: Mucous membranes are moist.     Pharynx: Oropharynx is clear.  Eyes:     General: No scleral icterus.       Right eye: No discharge.        Left eye: No discharge.     Conjunctiva/sclera: Conjunctivae normal.  Cardiovascular:     Rate and Rhythm: Normal rate and regular rhythm.     Heart sounds: Normal heart sounds.  Pulmonary:     Effort:  Pulmonary effort is normal. No respiratory distress.     Breath sounds: Normal breath sounds.  Musculoskeletal:     Cervical back: Neck supple.  Skin:    General: Skin is dry.  Neurological:  General: No focal deficit present.     Mental Status: She is alert. Mental status is at baseline.     Motor: No weakness.     Gait: Gait normal.  Psychiatric:        Mood and Affect: Mood normal.        Behavior: Behavior normal.        Thought Content: Thought content normal.      UC Treatments / Results  Labs (all labs ordered are listed, but only abnormal results are displayed) Labs Reviewed  SARS CORONAVIRUS 2 BY RT PCR    EKG   Radiology No results found.  Procedures ED EKG  Date/Time: 11/28/2022 9:21 AM  Performed by: Danton Clap, PA-C Authorized by: Danton Clap, PA-C   Previous ECG:    Previous ECG:  Unavailable Interpretation:    Interpretation: normal   Rate:    ECG rate:  69 Rhythm:    Rhythm: sinus rhythm   Ectopy:    Ectopy: none   QRS:    QRS axis:  Normal   QRS intervals:  Normal   QRS conduction: normal   ST segments:    ST segments:  Normal Comments:     Normal sinus rhythm. Regular rate.   (including critical care time)  Medications Ordered in UC Medications - No data to display  Initial Impression / Assessment and Plan / UC Course  I have reviewed the triage vital signs and the nursing notes.  Pertinent labs & imaging results that were available during my care of the patient were reviewed by me and considered in my medical decision making (see chart for details).   47 year old female presents for fatigue, dizziness and headaches for the past couple days.  Patient reports following up with her PCP 2 days ago and having routine lab work and a Pap smear performed.  She says she also received hepatitis and flu vaccines that day and since then she is felt like she has had a cold.  She reports some pressure in her chest and increased  headaches and dizziness.  She does not complain of any fevers, body aches, cough, congestion, sore throat, shortness of breath or wheezing, abdominal pain, vomiting or diarrhea.  History of iron deficiency anemia, hypertension, obesity status post bariatric surgery 4 years ago.  Vitals normal and stable and patient is overall well-appearing.  Exam is benign today.  Heart regular rate and rhythm chest clear to auscultation.  I reviewed patient's lab work from 11/26/2022.  She had CBC, BMP, hepatic function panel, lipid panel, IBC and ferritin, TSH and Pap smear performed.  Pap smear pending.  I reviewed all of the labs.  The only abnormality is saturation ratio of 18.9 and normal is 20 as well as ALT elevated at 59: normal is 35.  EKG and COVID test obtained.  Normal EKG today.  Negative COVID test.  Advised patient her symptoms are likely largely related to the vaccines and are normal side effects.  Advised rest and supportive care.  Advised to continue to follow-up with PCP if fatigue does not improve and hematology regarding her IDA.  Advised going to ER for chest pressure worsens or she has any acute worsening of symptoms.   Final Clinical Impressions(s) / UC Diagnoses   Final diagnoses:  Other fatigue  Chest pressure  Generalized headaches  History of anemia  Influenza vaccine side effect, initial encounter     Discharge Instructions      -I reviewed  all of your labs you had done a couple days ago.  They look good. - We checked an EKG today which is normal and your COVID test was also negative. - Some your symptoms are likely related to the 2 vaccines that you received.  Try to get some rest and increase your fluids and take Tylenol for headaches and discomfort. - Follow-up with your hematologist in regards to your anemia and your PCP if you continue to have fatigue. - Go to ER if your chest pressure worsens or you have any palpitations, increased dizziness, feel faint,  etc.     ED Prescriptions   None    PDMP not reviewed this encounter.   Shirlee Latch, PA-C 11/28/22 0959    Shirlee Latch, PA-C 11/28/22 [phone]

## 2022-11-28 NOTE — ED Triage Notes (Signed)
Pt c/o dizziness, headache, and fatigue. Started about 4 days ago. She states this morning her chest feels heavy like she has a chest cold. Denies URI symptoms.

## 2022-11-28 NOTE — Assessment & Plan Note (Signed)
History of bariatric surgery.   Seeing Dr. Brahmanday/hematology.  Has received IV iron.  Follow cbc and iron studies. Just had GI w/up as outlined.  Capsule study - results pending.  Reports increased fatigue.  Feels iron/hgb low.  Check labs today.

## 2022-11-28 NOTE — Assessment & Plan Note (Signed)
- 

## 2022-11-28 NOTE — Assessment & Plan Note (Signed)
Blood pressure doing well on no medication.  Follow.   

## 2022-12-03 LAB — CYTOLOGY - PAP
Comment: NEGATIVE
Diagnosis: NEGATIVE
Diagnosis: REACTIVE
High risk HPV: NEGATIVE

## 2022-12-22 ENCOUNTER — Telehealth: Payer: Self-pay

## 2022-12-22 ENCOUNTER — Encounter: Payer: Self-pay | Admitting: Gastroenterology

## 2022-12-22 NOTE — Telephone Encounter (Signed)
Left message on voicemail regarding capsule study

## 2022-12-25 ENCOUNTER — Ambulatory Visit (INDEPENDENT_AMBULATORY_CARE_PROVIDER_SITE_OTHER): Payer: Managed Care, Other (non HMO)

## 2022-12-25 DIAGNOSIS — Z23 Encounter for immunization: Secondary | ICD-10-CM

## 2022-12-25 NOTE — Progress Notes (Signed)
Pt presents today for 2nd Hep A/B vaccine. Left deltoid, IM. Pt voiced no concerns nor showed any signs of distress during injection.

## 2023-01-21 ENCOUNTER — Inpatient Hospital Stay: Payer: Managed Care, Other (non HMO) | Attending: Internal Medicine

## 2023-01-21 DIAGNOSIS — K909 Intestinal malabsorption, unspecified: Secondary | ICD-10-CM | POA: Diagnosis present

## 2023-01-21 DIAGNOSIS — D508 Other iron deficiency anemias: Secondary | ICD-10-CM | POA: Diagnosis not present

## 2023-01-21 DIAGNOSIS — D509 Iron deficiency anemia, unspecified: Secondary | ICD-10-CM

## 2023-01-21 DIAGNOSIS — Z9884 Bariatric surgery status: Secondary | ICD-10-CM

## 2023-01-21 LAB — CBC WITH DIFFERENTIAL/PLATELET
Abs Immature Granulocytes: 0.03 10*3/uL (ref 0.00–0.07)
Basophils Absolute: 0 10*3/uL (ref 0.0–0.1)
Basophils Relative: 0 %
Eosinophils Absolute: 0.1 10*3/uL (ref 0.0–0.5)
Eosinophils Relative: 1 %
HCT: 39.8 % (ref 36.0–46.0)
Hemoglobin: 13.2 g/dL (ref 12.0–15.0)
Immature Granulocytes: 0 %
Lymphocytes Relative: 21 %
Lymphs Abs: 1.6 10*3/uL (ref 0.7–4.0)
MCH: 30.6 pg (ref 26.0–34.0)
MCHC: 33.2 g/dL (ref 30.0–36.0)
MCV: 92.3 fL (ref 80.0–100.0)
Monocytes Absolute: 0.5 10*3/uL (ref 0.1–1.0)
Monocytes Relative: 6 %
Neutro Abs: 5.2 10*3/uL (ref 1.7–7.7)
Neutrophils Relative %: 72 %
Platelets: 207 10*3/uL (ref 150–400)
RBC: 4.31 MIL/uL (ref 3.87–5.11)
RDW: 11.9 % (ref 11.5–15.5)
WBC: 7.4 10*3/uL (ref 4.0–10.5)
nRBC: 0 % (ref 0.0–0.2)

## 2023-01-21 LAB — IRON AND TIBC
Iron: 60 ug/dL (ref 28–170)
Saturation Ratios: 15 % (ref 10.4–31.8)
TIBC: 392 ug/dL (ref 250–450)
UIBC: 332 ug/dL

## 2023-01-21 LAB — FERRITIN: Ferritin: 15 ng/mL (ref 11–307)

## 2023-01-21 LAB — VITAMIN B12: Vitamin B-12: 1236 pg/mL — ABNORMAL HIGH (ref 180–914)

## 2023-01-23 ENCOUNTER — Encounter: Payer: Self-pay | Admitting: Internal Medicine

## 2023-01-23 ENCOUNTER — Inpatient Hospital Stay (HOSPITAL_BASED_OUTPATIENT_CLINIC_OR_DEPARTMENT_OTHER): Payer: Managed Care, Other (non HMO) | Admitting: Internal Medicine

## 2023-01-23 ENCOUNTER — Inpatient Hospital Stay: Payer: Managed Care, Other (non HMO)

## 2023-01-23 VITALS — BP 119/82 | HR 105 | Temp 98.1°F | Resp 18 | Wt 226.4 lb

## 2023-01-23 DIAGNOSIS — E611 Iron deficiency: Secondary | ICD-10-CM

## 2023-01-23 DIAGNOSIS — D509 Iron deficiency anemia, unspecified: Secondary | ICD-10-CM

## 2023-01-23 DIAGNOSIS — D508 Other iron deficiency anemias: Secondary | ICD-10-CM | POA: Diagnosis not present

## 2023-01-23 MED ORDER — SODIUM CHLORIDE 0.9 % IV SOLN
Freq: Once | INTRAVENOUS | Status: AC
  Filled 2023-01-23: qty 250

## 2023-01-23 MED ORDER — SODIUM CHLORIDE 0.9 % IV SOLN
200.0000 mg | Freq: Once | INTRAVENOUS | Status: AC
  Administered 2023-01-23: 200 mg via INTRAVENOUS
  Filled 2023-01-23: qty 200

## 2023-01-23 NOTE — Progress Notes (Signed)
Pt has felt really bad last week. Feels dizzy. Mild dyspnea, heart racing. No visible blood in stool. Last menstrual cycle 1 month ago. Due now.

## 2023-01-23 NOTE — Assessment & Plan Note (Signed)
#  Iron deficiency anemia-multifactorial/malabsorption-bariatric surgery; heavy menstrual cycles. S/p IV iron.   Discussed re: re-starting barimelts/not started yet.   # Today hemoglobin is 13.5; however patient is symptomatic and heavy menstrual cycles.  And also iron studies low normal.  Iron sat; ferritin- 15-  proceed with Venofer weekly x 2   # DISPOSITION: # venofer today; and again in 1 week.  # follow up in 6 months- MD; 2-3 days prior- labs- cbc/bmp;ironstudies/ferritin; B12 levels- possible venofer- Dr.B

## 2023-01-23 NOTE — Progress Notes (Signed)
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  Also capsule study [last as per patient].   #Chronic fatigue- Dx: OSA; intolerant to CPAP; 2022-sleep study negative.    HISTORY OF PRESENTING ILLNESS: Alone.  Ambulating independently.  Erica Hernandez 47 y.o.  female with iron deficiency sec to malabsorption/duodenal switch/heavy menstrual cycles is here for follow-up.  In the interim patient underwent a colonoscopy.  Also capsule study [last as per patient].  Awaiting repeat.  Patient complains of ongoing fatigue.  No nausea vomiting.  Patient denies any blood in stools or black-colored stools.  Patient denies any nausea vomiting abdominal pain.  Review of Systems  Constitutional:  Positive for malaise/fatigue and weight loss (Intentional/gastric bypass). Negative for chills, diaphoresis and fever.  HENT:  Negative for nosebleeds and sore throat.   Eyes:  Negative for double vision.  Respiratory:  Negative for cough, hemoptysis, sputum production, shortness of breath and wheezing.   Cardiovascular:  Negative for chest pain, palpitations, orthopnea and leg swelling.  Gastrointestinal:  Negative for abdominal pain, blood in stool, constipation, diarrhea, heartburn, melena, nausea and vomiting.  Genitourinary:  Negative for dysuria, frequency and urgency.  Musculoskeletal:  Negative for back pain and joint pain.  Skin: Negative.  Negative for itching and rash.  Neurological:  Negative for dizziness, tingling, focal weakness, weakness and headaches.  Endo/Heme/Allergies:  Does not bruise/bleed  easily.  Psychiatric/Behavioral:  Negative for depression. The patient is not nervous/anxious and does not have insomnia.     MEDICAL HISTORY:  Past Medical History:  Diagnosis Date   Hypertension    during pregnancy    SURGICAL HISTORY: Past Surgical History:  Procedure Laterality Date   COLONOSCOPY N/A 09/26/2022   Procedure: COLONOSCOPY;  Surgeon: Lucilla Lame, MD;  Location: [facility];  Service: Endoscopy;  Laterality: N/A;   ESOPHAGOGASTRODUODENOSCOPY N/A 09/26/2022   Procedure: ESOPHAGOGASTRODUODENOSCOPY (EGD);  Surgeon: Lucilla Lame, MD;  Location: [facility];  Service: Endoscopy;  Laterality: N/A;   GIVENS CAPSULE STUDY N/A 11/06/2022   Procedure: GIVENS CAPSULE STUDY;  Surgeon: Lucilla Lame, MD;  Location: [facility];  Service: Endoscopy;  Laterality: N/A;   HERNIA REPAIR     SHOULDER SURGERY  03/30/11   TONSILLECTOMY  1984    SOCIAL HISTORY: Social History   Socioeconomic History   Marital status: Married    Spouse name: Not on file   Number of children: 1   Years of education: Not on file   Highest education level: Not on file  Occupational History    Employer: [employer]  Tobacco Use   Smoking status: Never   Smokeless tobacco: Never  Vaping Use   Vaping Use: Never used  Substance and Sexual Activity   Alcohol use: No    Alcohol/week: 0.0 standard drinks of alcohol   Drug use: No   Sexual activity: Not on file  Other Topics Concern   Not on file  Social History Narrative   Lives in [place]; Network engineer- garage; never smoked; rare alcohol.    Social Determinants of Health   Financial Resource Strain: Not on file  Food Insecurity: Not on file  Transportation Needs: Not on file  Physical Activity: Not on file  Stress: Not on file  Social Connections: Not on file  Intimate Partner Violence: Not on file    FAMILY HISTORY: Family History  Problem Relation Age of Onset   Hypertension Father    Breast cancer Maternal  Grandmother        60-70   Breast cancer Paternal Grandmother        64-70   Diabetes Other        parent   Colon cancer Neg Hx     ALLERGIES:  is allergic to sulfa antibiotics.  MEDICATIONS:  Current Outpatient Medications  Medication Sig Dispense Refill   Biotin 10 MG CAPS Take by mouth.     Calcium Carbonate-Vitamin D3 600-400 MG-UNIT TABS Take 1 mg by mouth daily.     cetirizine (ZYRTEC) 10 MG tablet Take by mouth.     [employer] FIBER GUMMIES PO Take by mouth.     tranexamic acid (LYSTEDA) 650 MG TABS tablet Take 2 tablets (1,300 mg total) by mouth 3 (three) times daily. 30 tablet 3   No current facility-administered medications for this visit.      PHYSICAL EXAMINATION:   Vitals:   01/23/23 1304  BP: 119/82  Pulse: (!) 105  Resp: 18  Temp: 98.1 F (36.7 C)   Filed Weights   01/23/23 1304  Weight: 226 lb 6.4 oz (102.7 kg)    Physical Exam HENT:     Head: Normocephalic and atraumatic.     Mouth/Throat:     Pharynx: No oropharyngeal exudate.  Eyes:     Pupils: Pupils are equal, round, and reactive to light.  Cardiovascular:     Rate and Rhythm: Normal rate and regular rhythm.  Pulmonary:     Effort: Pulmonary effort is normal. No respiratory distress.     Breath sounds: Normal breath sounds. No wheezing.  Abdominal:     General: Bowel sounds are normal. There is no distension.     Palpations: Abdomen is soft. There is no mass.     Tenderness: There is no abdominal tenderness. There is no guarding or rebound.  Musculoskeletal:        General: No tenderness. Normal range of motion.     Cervical back: Normal range of motion and neck supple.  Skin:    General: Skin is warm.  Neurological:     Mental Status: She is alert and oriented to person, place, and time.  Psychiatric:        Mood and Affect: Affect normal.     LABORATORY DATA:  I have reviewed the data as listed Lab Results  Component Value Date   WBC 7.4 01/21/2023   HGB 13.2 01/21/2023   HCT  39.8 01/21/2023   MCV 92.3 01/21/2023   PLT 207 01/21/2023   Recent Labs    02/28/22 1238 03/03/22 0814 03/24/22 0732 07/16/22 1001 09/18/22 1340 11/26/22 0825  NA 136  --   --   --  137 139  K 4.0  --   --   --  4.0 4.3  CL 106  --   --   --  105 106  CO2 26  --   --   --  26 27  GLUCOSE 100*  --   --   --  87 82  BUN 18  --   --   --  22* 16  CREATININE 0.82  --   --   --  0.74 0.73  CALCIUM 8.6*  --   --   --  9.1 8.8  GFRNONAA >60  --   --   --  >60  --   PROT  --    < > 6.2 6.7 6.9 6.7  ALBUMIN  --    < > 3.8 4.6 4.1 4.3  AST  --    < > 35 32 24 30  ALT  --    < > 58* 57* 30 50*  ALKPHOS  --    < > 89 100 80 77  BILITOT  --    < > 0.4 0.3 0.2* 0.5  BILIDIR  --    < > 0.0 0.10  --  0.1   < > = values in this interval not displayed.     No results found.  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 S/p IV iron.   Discussed re: re-starting barimelts/not started yet.   # Today hemoglobin is 13.5; however patient is symptomatic and heavy menstrual cycles.  And also iron studies low normal.  Iron sat; ferritin- 15-  proceed with Venofer weekly x 2   # DISPOSITION: # venofer today; and again in 1 week.  # follow up in 6 months- MD; 2-3 days prior- labs- cbc/bmp;ironstudies/ferritin; B12 levels- possible venofer- Dr.B  All questions were answered. The patient knows to call the clinic with any problems, questions or concerns.   Cammie Sickle, MD 01/23/2023 1:48 PM

## 2023-01-25 ENCOUNTER — Encounter: Payer: Self-pay | Admitting: Internal Medicine

## 2023-01-30 ENCOUNTER — Inpatient Hospital Stay: Payer: Managed Care, Other (non HMO)

## 2023-01-30 VITALS — BP 101/76 | HR 66 | Temp 97.8°F | Resp 18

## 2023-01-30 DIAGNOSIS — D509 Iron deficiency anemia, unspecified: Secondary | ICD-10-CM

## 2023-01-30 DIAGNOSIS — E611 Iron deficiency: Secondary | ICD-10-CM

## 2023-01-30 DIAGNOSIS — D508 Other iron deficiency anemias: Secondary | ICD-10-CM | POA: Diagnosis not present

## 2023-01-30 MED ORDER — SODIUM CHLORIDE 0.9 % IV SOLN
Freq: Once | INTRAVENOUS | Status: AC
  Filled 2023-01-30: qty 250

## 2023-01-30 MED ORDER — SODIUM CHLORIDE 0.9 % IV SOLN
200.0000 mg | Freq: Once | INTRAVENOUS | Status: AC
  Administered 2023-01-30: 200 mg via INTRAVENOUS
  Filled 2023-01-30: qty 200

## 2023-01-30 NOTE — Progress Notes (Signed)
Pt has been educated and understands. Pt refused to stay 30 mins after iron infusion. VSS. 

## 2023-02-04 ENCOUNTER — Ambulatory Visit
Admission: EM | Admit: 2023-02-04 | Discharge: 2023-02-04 | Disposition: A | Discharge status: Home / Self Care | Payer: Managed Care, Other (non HMO) | Attending: Emergency Medicine | Admitting: Emergency Medicine

## 2023-02-04 DIAGNOSIS — J069 Acute upper respiratory infection, unspecified: Secondary | ICD-10-CM | POA: Diagnosis not present

## 2023-02-04 LAB — GROUP A STREP BY PCR: Group A Strep by PCR: NOT DETECTED

## 2023-02-04 MED ORDER — BENZONATATE 100 MG PO CAPS: 200.0000 mg | ORAL_CAPSULE | Freq: Three times a day (TID) | ORAL | 0 refills | Status: DC

## 2023-02-04 MED ORDER — PROMETHAZINE-DM 6.25-15 MG/5ML PO SYRP: 5.0000 mL | ORAL_SOLUTION | Freq: Four times a day (QID) | ORAL | 0 refills | Status: DC | PRN

## 2023-02-04 MED ORDER — IPRATROPIUM BROMIDE 0.06 % NA SOLN: 2.0000 | Freq: Four times a day (QID) | NASAL | 12 refills | Status: DC

## 2023-02-04 NOTE — ED Triage Notes (Signed)
Patient presents to UC for HA, bilateral ear pain,  sore throat x 1 day. Treating pain with sudafed.

## 2023-02-04 NOTE — Discharge Instructions (Signed)
Your test today for strep was negative.  I do believe you have a viral upper respiratory infection and this is what is causing your symptoms.  Please use over-the-counter Tylenol and/or ibuprofen according to package instructions as needed for pain.  You may gargle with warm salt water, or use over-the-counter Chloraseptic or Sucrets lozenges, as needed to soothe your throat.  Use the Atrovent nasal spray, 2 squirts in each nostril every 6 hours, as needed for runny nose and postnasal drip.  Use the Tessalon Perles every 8 hours during the day.  Take them with a small sip of water.  They may give you some numbness to the base of your tongue or a metallic taste in your mouth, this is normal.  Use the Promethazine DM cough syrup at bedtime for cough and congestion.  It will make you drowsy so do not take it during the day.  Return for reevaluation or see your primary care provider for any new or worsening symptoms.

## 2023-02-04 NOTE — ED Provider Notes (Signed)
[facility]    CSN: [id number] Arrival date & time: 02/04/23  0801      History   Chief Complaint Chief Complaint  Patient presents with   Headache   Sore Throat    HPI Erica Hernandez is a 47 y.o. female.   HPI  47 year old female with a past medical history significant for hypertension, GERD, and anemia presents for evaluation of 1 day worth of headache, bilateral ear pain, and sore throat.  Patient has had bariatric surgery as well as a tonsillectomy.  Past Medical History:  Diagnosis Date   Hypertension    during pregnancy    Patient Active Problem List   Diagnosis Date Noted   Toenail fungus 09/22/2021   Fatigue 09/17/2021   Tail bone pain 11/17/2020   Dysphagia 11/17/2020   Gastroesophageal reflux disease without esophagitis 10/11/2020   Fatty liver 10/11/2020   Iron deficiency 07/18/2020   Menstrual irregularity 03/10/2020   Cervical cancer screening 09/04/2019   Encounter for birth control 09/04/2019   Anemia 08/31/2019   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/29/2019   Skin lesion 01/22/2019   Breast lesion 10/14/2015   Health care maintenance 10/14/2015   Abdominal cyst 04/23/2015   Hypertension, essential 11/19/2014   Ear pain 10/12/2014   Stress 07/23/2014   Fullness in head 07/23/2014   Headache(784.0) 03/20/2014   Rectal irritation 03/20/2014   Sinusitis 01/15/2014    Past Surgical History:  Procedure Laterality Date   COLONOSCOPY N/A 09/26/2022   Procedure: COLONOSCOPY;  Surgeon: Lucilla Lame, MD;  Location: [facility] [place];  Service: Endoscopy;  Laterality: N/A;   ESOPHAGOGASTRODUODENOSCOPY N/A 09/26/2022   Procedure: ESOPHAGOGASTRODUODENOSCOPY (EGD);  Surgeon: Lucilla Lame, MD;  Location: [facility];  Service: Endoscopy;  Laterality: N/A;   GIVENS CAPSULE STUDY N/A 11/06/2022   Procedure: GIVENS CAPSULE STUDY;  Surgeon: Lucilla Lame, MD;  Location: [facility] ENDOSCOPY;  Service: Endoscopy;  Laterality: N/A;   HERNIA REPAIR      SHOULDER SURGERY  03/30/11   TONSILLECTOMY  1984    OB History   No obstetric history on file.      Home Medications    Prior to Admission medications   Medication Sig Start Date End Date Taking? Authorizing Provider  benzonatate (TESSALON) 100 MG capsule Take 2 capsules (200 mg total) by mouth every 8 (eight) hours. 02/04/23  Yes Margarette Canada, NP  ipratropium (ATROVENT) 0.06 % nasal spray Place 2 sprays into both nostrils 4 (four) times daily. 02/04/23  Yes Margarette Canada, NP  promethazine-dextromethorphan (PROMETHAZINE-DM) 6.25-15 MG/5ML syrup Take 5 mLs by mouth 4 (four) times daily as needed. 02/04/23  Yes Margarette Canada, NP  Biotin 10 MG CAPS Take by mouth.    [provider]  Calcium Carbonate-Vitamin D3 600-400 MG-UNIT TABS Take 1 mg by mouth daily.    [provider]  cetirizine (ZYRTEC) 10 MG tablet Take by mouth.    [provider]  [employer] FIBER GUMMIES PO Take by mouth.    [provider]  tranexamic acid (LYSTEDA) 650 MG TABS tablet Take 2 tablets (1,300 mg total) by mouth 3 (three) times daily. 06/11/22   Einar Pheasant, MD    Family History Family History  Problem Relation Age of Onset   Hypertension Father    Breast cancer Maternal Grandmother        18-70   Breast cancer Paternal Grandmother        41-70   Diabetes Other        parent  Colon cancer Neg Hx     Social History Social History   Tobacco Use   Smoking status: Never   Smokeless tobacco: Never  Vaping Use   Vaping Use: Never used  Substance Use Topics   Alcohol use: No    Alcohol/week: 0.0 standard drinks of alcohol   Drug use: No     Allergies   Sulfa antibiotics   Review of Systems Review of Systems  Constitutional:  Negative for fever.  HENT:  Positive for sore throat. Negative for congestion and rhinorrhea.   Respiratory:  Positive for cough.   Gastrointestinal:  Negative for abdominal pain, diarrhea, nausea and vomiting.  Neurological:  Positive  for headaches.     Physical Exam Triage Vital Signs ED Triage Vitals  Enc Vitals Group     BP      Pulse      Resp      Temp      Temp src      SpO2      Weight      Height      Head Circumference      Peak Flow      Pain Score      Pain Loc      Pain Edu?      Excl. in [place]?    No data found.  Updated Vital Signs BP 111/76 (BP Location: Right Arm)   Pulse 77   Resp 16   LMP 01/28/2023   SpO2 97%   Visual Acuity Right Eye Distance:   Left Eye Distance:   Bilateral Distance:    Right Eye Near:   Left Eye Near:    Bilateral Near:     Physical Exam Vitals and nursing note reviewed.  Constitutional:      Appearance: Normal appearance. She is not ill-appearing.  HENT:     Head: Normocephalic and atraumatic.     Right Ear: Tympanic membrane, ear canal and external ear normal. There is no impacted cerumen.     Left Ear: Tympanic membrane, ear canal and external ear normal. There is no impacted cerumen.     Nose: Nose normal. No congestion or rhinorrhea.     Mouth/Throat:     Mouth: Mucous membranes are moist.     Pharynx: Oropharynx is clear. No oropharyngeal exudate or posterior oropharyngeal erythema.  Neck:     Comments: Bilateral anterior cervical adenopathy present exam. Cardiovascular:     Rate and Rhythm: Normal rate and regular rhythm.     Pulses: Normal pulses.     Heart sounds: Normal heart sounds. No murmur heard.    No friction rub. No gallop.  Pulmonary:     Effort: Pulmonary effort is normal.     Breath sounds: Normal breath sounds. No wheezing, rhonchi or rales.  Musculoskeletal:     Cervical back: Normal range of motion and neck supple.  Lymphadenopathy:     Cervical: Cervical adenopathy present.  Skin:    General: Skin is warm and dry.     Capillary Refill: Capillary refill takes less than 2 seconds.  Neurological:     Mental Status: She is alert.      UC Treatments / Results  Labs (all labs ordered are listed, but only abnormal  results are displayed) Labs Reviewed  GROUP A STREP BY PCR    EKG   Radiology No results found.  Procedures Procedures (including critical care time)  Medications Ordered in UC Medications - No data to display  Initial Impression / Assessment and Plan / UC Course  I have reviewed the triage vital signs and the nursing notes.  Pertinent labs & imaging results that were available during my care of the patient were reviewed by me and considered in my medical decision making (see chart for details).   Patient is a pleasant, nontoxic-appearing 47 year old female presenting for evaluation of sore throat, headache, and bilateral ear pain that started yesterday and intensified this morning.  She states she has seen patches in the back of her throat but they are not white and she has developed a mild, nonproductive cough.  On exam her upper respiratory tree is largely unremarkable.  She does have anterior cervical lymphadenopathy on exam.  It is possible that patient is developing an upper respiratory virus.  She states that she is due to go out of the country in a week.  Given that she has a primary complaint of sore throat I will order a strep PCR.  Strep PCR is negative.  I will discharge patient home with diagnosis of viral URI with a cough and I will prescribe Atrovent nasal spray, Tessalon Perles, Promethazine DM cough syrup to help her with her symptoms.  She can use over-the-counter Tylenol and or ibuprofen as needed for pain.  Salt water gargles and over-the-counter Chloraseptic or Sucrets lozenges to soothe the throat.  Return precautions reviewed.   Final Clinical Impressions(s) / UC Diagnoses   Final diagnoses:  Viral URI with cough     Discharge Instructions      Your test today for strep was negative.  I do believe you have a viral upper respiratory infection and this is what is causing your symptoms.  Please use over-the-counter Tylenol and/or ibuprofen according to  package instructions as needed for pain.  You may gargle with warm salt water, or use over-the-counter Chloraseptic or Sucrets lozenges, as needed to soothe your throat.  Use the Atrovent nasal spray, 2 squirts in each nostril every 6 hours, as needed for runny nose and postnasal drip.  Use the Tessalon Perles every 8 hours during the day.  Take them with a small sip of water.  They may give you some numbness to the base of your tongue or a metallic taste in your mouth, this is normal.  Use the Promethazine DM cough syrup at bedtime for cough and congestion.  It will make you drowsy so do not take it during the day.  Return for reevaluation or see your primary care provider for any new or worsening symptoms.      ED Prescriptions     Medication Sig Dispense Auth. Provider   benzonatate (TESSALON) 100 MG capsule Take 2 capsules (200 mg total) by mouth every 8 (eight) hours. 21 capsule Margarette Canada, NP   ipratropium (ATROVENT) 0.06 % nasal spray Place 2 sprays into both nostrils 4 (four) times daily. 15 mL Margarette Canada, NP   promethazine-dextromethorphan (PROMETHAZINE-DM) 6.25-15 MG/5ML syrup Take 5 mLs by mouth 4 (four) times daily as needed. 118 mL Margarette Canada, NP      PDMP not reviewed this encounter.   Margarette Canada, NP 02/04/23 [phone]

## 2023-02-14 ENCOUNTER — Other Ambulatory Visit: Payer: Self-pay | Admitting: Internal Medicine

## 2023-02-19 NOTE — Telephone Encounter (Signed)
Per note, was discontinued.  Please confirm if pt needs and is requesting.

## 2023-02-24 ENCOUNTER — Encounter: Payer: Self-pay | Admitting: Internal Medicine

## 2023-02-24 DIAGNOSIS — B351 Tinea unguium: Secondary | ICD-10-CM

## 2023-02-25 NOTE — Telephone Encounter (Signed)
Please refuse. Referring to podiatry because topical meds not working.

## 2023-02-26 ENCOUNTER — Encounter: Payer: Self-pay | Admitting: Internal Medicine

## 2023-03-16 ENCOUNTER — Encounter: Payer: Self-pay | Admitting: Internal Medicine

## 2023-03-16 ENCOUNTER — Other Ambulatory Visit: Payer: Self-pay

## 2023-03-16 ENCOUNTER — Inpatient Hospital Stay: Payer: Managed Care, Other (non HMO) | Attending: Internal Medicine

## 2023-03-16 DIAGNOSIS — K909 Intestinal malabsorption, unspecified: Secondary | ICD-10-CM | POA: Insufficient documentation

## 2023-03-16 DIAGNOSIS — D500 Iron deficiency anemia secondary to blood loss (chronic): Secondary | ICD-10-CM | POA: Diagnosis present

## 2023-03-16 DIAGNOSIS — N920 Excessive and frequent menstruation with regular cycle: Secondary | ICD-10-CM | POA: Insufficient documentation

## 2023-03-16 DIAGNOSIS — D509 Iron deficiency anemia, unspecified: Secondary | ICD-10-CM

## 2023-03-16 LAB — CBC (CANCER CENTER ONLY)
HCT: 38.8 % (ref 36.0–46.0)
Hemoglobin: 12.8 g/dL (ref 12.0–15.0)
MCH: 31.1 pg (ref 26.0–34.0)
MCHC: 33 g/dL (ref 30.0–36.0)
MCV: 94.2 fL (ref 80.0–100.0)
Platelet Count: 184 10*3/uL (ref 150–400)
RBC: 4.12 MIL/uL (ref 3.87–5.11)
RDW: 13.2 % (ref 11.5–15.5)
WBC Count: 5 10*3/uL (ref 4.0–10.5)
nRBC: 0 % (ref 0.0–0.2)

## 2023-03-16 LAB — CMP (CANCER CENTER ONLY)
ALT: 29 U/L (ref 0–44)
AST: 23 U/L (ref 15–41)
Albumin: 3.9 g/dL (ref 3.5–5.0)
Alkaline Phosphatase: 63 U/L (ref 38–126)
Anion gap: 6 (ref 5–15)
BUN: 22 mg/dL — ABNORMAL HIGH (ref 6–20)
CO2: 24 mmol/L (ref 22–32)
Calcium: 8.5 mg/dL — ABNORMAL LOW (ref 8.9–10.3)
Chloride: 107 mmol/L (ref 98–111)
Creatinine: 0.84 mg/dL (ref 0.44–1.00)
GFR, Estimated: >60 mL/min (ref >60)
Glucose, Bld: 95 mg/dL (ref 70–99)
Potassium: 4.2 mmol/L (ref 3.5–5.1)
Sodium: 137 mmol/L (ref 135–145)
Total Bilirubin: 0.4 mg/dL (ref 0.3–1.2)
Total Protein: 6.6 g/dL (ref 6.5–8.1)

## 2023-03-16 LAB — IRON AND TIBC
Iron: 54 ug/dL (ref 28–170)
Saturation Ratios: 17 % (ref 10.4–31.8)
TIBC: 326 ug/dL (ref 250–450)
UIBC: 272 ug/dL

## 2023-03-16 LAB — FERRITIN: Ferritin: 30 ng/mL (ref 11–307)

## 2023-03-16 NOTE — Progress Notes (Signed)
Called Patient and she is already scheduled.No questions

## 2023-03-16 NOTE — Telephone Encounter (Signed)
Morning Erica Hernandez, can you schedule a lab appointment for this patient today. I called her and she said anytime this morning after 1030. She will see the apt message on mychart. Thanks

## 2023-03-17 ENCOUNTER — Encounter: Payer: Self-pay | Admitting: Gastroenterology

## 2023-03-18 ENCOUNTER — Inpatient Hospital Stay: Payer: Managed Care, Other (non HMO)

## 2023-03-18 VITALS — BP 104/66 | HR 79 | Temp 98.2°F | Resp 18

## 2023-03-18 DIAGNOSIS — D509 Iron deficiency anemia, unspecified: Secondary | ICD-10-CM

## 2023-03-18 DIAGNOSIS — E611 Iron deficiency: Secondary | ICD-10-CM

## 2023-03-18 DIAGNOSIS — D500 Iron deficiency anemia secondary to blood loss (chronic): Secondary | ICD-10-CM | POA: Diagnosis not present

## 2023-03-18 MED ORDER — SODIUM CHLORIDE 0.9 % IV SOLN
200.0000 mg | Freq: Once | INTRAVENOUS | Status: AC
  Administered 2023-03-18: 200 mg via INTRAVENOUS
  Filled 2023-03-18: qty 200

## 2023-03-18 MED ORDER — SODIUM CHLORIDE 0.9 % IV SOLN
Freq: Once | INTRAVENOUS | Status: AC
  Filled 2023-03-18: qty 250

## 2023-03-18 NOTE — Patient Instructions (Signed)

## 2023-03-25 ENCOUNTER — Encounter: Payer: Self-pay | Admitting: Podiatry

## 2023-03-25 ENCOUNTER — Ambulatory Visit: Payer: Managed Care, Other (non HMO) | Admitting: Podiatry

## 2023-03-25 DIAGNOSIS — N921 Excessive and frequent menstruation with irregular cycle: Secondary | ICD-10-CM | POA: Insufficient documentation

## 2023-03-25 DIAGNOSIS — L603 Nail dystrophy: Secondary | ICD-10-CM

## 2023-03-25 NOTE — Progress Notes (Signed)
COVID-19 positive test (U07.1, COVID-19) with Viral Sepsis (A41.89 other specified sepsis) (If respiratory failure present, add as separate assessment)   Subjective:  Patient ID: Erica Hernandez, female    DOB: [birth year],  MRN: [id number] HPI Chief Complaint  Patient presents with   Nail Problem    Hallux bilateral and 3rd right - toenails thick and discolored since 2022, PCP Rx'd ciclopirox - using for almost 2 years - no help   New Patient (Initial Visit)    47 y.o. female presents with the above complaint.   ROS: Denies fever chills nausea vomit muscle aches pains calf pain back pain chest pain shortness of breath.  Past Medical History:  Diagnosis Date   Hypertension    during pregnancy   Past Surgical History:  Procedure Laterality Date   COLONOSCOPY N/A 09/26/2022   Procedure: COLONOSCOPY;  Surgeon: Midge Minium, MD;  [facility];  Service: Endoscopy;  Laterality: N/A;   ESOPHAGOGASTRODUODENOSCOPY N/A 09/26/2022   Procedure: ESOPHAGOGASTRODUODENOSCOPY (EGD);  Surgeon: Midge Minium, MD;  [facility];  Service: Endoscopy;  Laterality: N/A;   GIVENS CAPSULE STUDY N/A 11/06/2022   Procedure: GIVENS CAPSULE STUDY;  Surgeon: Midge Minium, MD;  Location: [facility];  Service: Endoscopy;  Laterality: N/A;   HERNIA REPAIR     SHOULDER SURGERY  03/30/11   TONSILLECTOMY  1984    Current Outpatient Medications:    pantoprazole (PROTONIX) 40 MG tablet, Take 40 mg by mouth at bedtime., Disp: , Rfl:    Biotin 10 MG CAPS, Take by mouth., Disp: , Rfl:    Calcium Carbonate-Vitamin D3 600-400 MG-UNIT TABS, Take 1 mg by mouth daily., Disp: , Rfl:    cetirizine (ZYRTEC) 10 MG tablet, Take by mouth., Disp: , Rfl:    [employer] FIBER GUMMIES PO, Take by mouth., Disp: , Rfl:    tranexamic acid (LYSTEDA) 650 MG TABS tablet, Take 2 tablets (1,300 mg total) by mouth 3 (three) times daily., Disp: 30 tablet, Rfl: 3  Allergies  Allergen Reactions   Sulfa  Antibiotics    Review of Systems Objective:  There were no vitals filed for this visit.  General: Well developed, nourished, in no acute distress, alert and oriented x3   Dermatological: Skin is warm, dry and supple bilateral. Nails x 10 are well maintained; remaining integument appears unremarkable at this time. There are no open sores, no preulcerative lesions, no rash or signs of infection present.  Hallux nail bilateral and third nail plate right do demonstrates thick discoloration with sharply faded margins and subungual debris  Vascular: Dorsalis Pedis artery and Posterior Tibial artery pedal pulses are 2/4 bilateral with immedate capillary fill time. Pedal hair growth present. No varicosities and no lower extremity edema present bilateral.   Neruologic: Grossly intact via light touch bilateral. Vibratory intact via tuning fork bilateral. Protective threshold with Semmes Wienstein monofilament intact to all pedal sites bilateral. Patellar and Achilles deep tendon reflexes 2+ bilateral. No Babinski or clonus noted bilateral.   Musculoskeletal: No gross boney pedal deformities bilateral. No pain, crepitus, or limitation noted with foot and ankle range of motion bilateral. Muscular strength 5/5 in all groups tested bilateral.  Gait: Unassisted, Nonantalgic.    Radiographs:  None taken  Assessment & Plan:   Assessment: Nail dystrophy hallux bilaterally third digit right; cannot rule out onychomycosis  Plan: Conservative therapies such as trimming the nails and using ciclopirox has failed to render this patient asymptomatic so at this point I took samples of the  skin and nail today to be sent for pathologic evaluation I will follow-up with her once the results come in.  We will reschedule her for 4 weeks     Tamanna Whitson T. [place], [place]

## 2023-04-22 ENCOUNTER — Encounter: Payer: Self-pay | Admitting: Podiatry

## 2023-04-22 ENCOUNTER — Ambulatory Visit: Payer: Managed Care, Other (non HMO) | Admitting: Podiatry

## 2023-04-22 DIAGNOSIS — L603 Nail dystrophy: Secondary | ICD-10-CM

## 2023-04-22 NOTE — Progress Notes (Signed)
She presents today for follow-up of her nail pathology.  States that there have been no changes in her nails.  Objective: Vital signs stable oriented x 3 no changes in the past medical history medications allergies pathology does demonstrate hyperkeratosis beneath the nails.  The nails himself do not demonstrate any type of onychomycosis no fungus yeast mold bacteria no nail dystrophy.  Assessment: Demonstrates hyper keratinization beneath the nail.  Plan: Discussed this with the patient she understands to keep the nail cut short and filed thin and will follow-up with me as needed.

## 2023-05-05 ENCOUNTER — Ambulatory Visit
Admission: EM | Admit: 2023-05-05 | Discharge: 2023-05-05 | Disposition: A | Discharge status: Home / Self Care | Payer: Managed Care, Other (non HMO) | Attending: Emergency Medicine | Admitting: Emergency Medicine

## 2023-05-05 DIAGNOSIS — J069 Acute upper respiratory infection, unspecified: Secondary | ICD-10-CM | POA: Diagnosis not present

## 2023-05-05 DIAGNOSIS — R5383 Other fatigue: Secondary | ICD-10-CM | POA: Diagnosis not present

## 2023-05-05 DIAGNOSIS — N390 Urinary tract infection, site not specified: Secondary | ICD-10-CM

## 2023-05-05 DIAGNOSIS — R0981 Nasal congestion: Secondary | ICD-10-CM

## 2023-05-05 MED ORDER — AMOXICILLIN-POT CLAVULANATE 875-125 MG PO TABS: 1.0000 | ORAL_TABLET | Freq: Two times a day (BID) | ORAL | 0 refills | Status: AC

## 2023-05-05 NOTE — Discharge Instructions (Addendum)
Please follow up with PCP and hematologist regarding anemia management-call for appt. Your vital signs are normal in office today. We are treating you for URI. Take antibiotic as directed until completed. Continue daily allergy med, may use flonase nasal spray to help with symptoms. Drink plenty of water.

## 2023-05-05 NOTE — ED Provider Notes (Signed)
[facility]    CSN: [id number] Arrival date & time: 05/05/23  0801      History   Chief Complaint Chief Complaint  Patient presents with   Fatigue   Nasal Congestion   Cough    HPI DELLAR Hernandez is a 47 y.o. female.   47 year old female, Erica Hernandez, with a past medical history significant for hypertension, GERD, and anemia presents for evaluation of over 1 week hx of fatigue, cough,runny nose. Patient has had bariatric surgery as well as a tonsillectomy. Takes daily zyrtec.  The history is provided by the patient. No language interpreter was used.    Past Medical History:  Diagnosis Date   Hypertension    during pregnancy    Patient Active Problem List   Diagnosis Date Noted   Acute upper urinary tract infection 05/05/2023   Nasal congestion 05/05/2023   Menorrhagia with irregular cycle 03/25/2023   Toenail fungus 09/22/2021   Fatigue 09/17/2021   BMI 29.0-29.9,adult 01/04/2021   Hiatal hernia 01/04/2021   Tail bone pain 11/17/2020   Dysphagia 11/17/2020   Postop check 11/13/2020   Gastroesophageal reflux disease without esophagitis 10/11/2020   Fatty liver 10/11/2020   Iron deficiency 07/18/2020   Menstrual irregularity 03/10/2020   Iron deficiency anemia 01/10/2020   Cervical cancer screening 09/04/2019   Encounter for birth control 09/04/2019   Anemia 08/31/2019   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/29/2019   Acute URI 02/06/2019   Skin lesion 01/22/2019   Chronic conjunctivitis of both eyes 11/24/2018   Myopia of both eyes 11/24/2018   Presbyopia 11/24/2018   Breast lesion 10/14/2015   Health care maintenance 10/14/2015   Abdominal cyst 04/23/2015   Hypertension, essential 11/19/2014   Ear pain 10/12/2014   Stress 07/23/2014   Fullness in head 07/23/2014   Headache(784.0) 03/20/2014   Rectal irritation 03/20/2014   Sinusitis 01/15/2014    Past Surgical History:  Procedure Laterality Date   COLONOSCOPY N/A 09/26/2022   Procedure:  COLONOSCOPY;  Surgeon: Midge Minium, MD;  Location: [facility];  Service: Endoscopy;  Laterality: N/A;   ESOPHAGOGASTRODUODENOSCOPY N/A 09/26/2022   Procedure: ESOPHAGOGASTRODUODENOSCOPY (EGD);  Surgeon: Midge Minium, MD;  Location: [facility];  Service: Endoscopy;  Laterality: N/A;   GIVENS CAPSULE STUDY N/A 11/06/2022   Procedure: GIVENS CAPSULE STUDY;  Surgeon: Midge Minium, MD;  Location: [facility] ENDOSCOPY;  Service: Endoscopy;  Laterality: N/A;   HERNIA REPAIR     SHOULDER SURGERY  03/30/11   TONSILLECTOMY  1984    OB History   No obstetric history on file.      Home Medications    Prior to Admission medications   Medication Sig Start Date End Date Taking? Authorizing Provider  amoxicillin-clavulanate (AUGMENTIN) 875-125 MG tablet Take 1 tablet by mouth every 12 (twelve) hours for 7 days. 05/05/23 05/12/23 Yes Haydan Wedig, Para March, NP  Biotin 10 MG CAPS Take by mouth.    [provider]  Calcium Carbonate-Vitamin D3 600-400 MG-UNIT TABS Take 1 mg by mouth daily.    [provider]  cetirizine (ZYRTEC) 10 MG tablet Take by mouth.    [provider]  [employer] FIBER GUMMIES PO Take by mouth.    [provider]  pantoprazole (PROTONIX) 40 MG tablet Take 40 mg by mouth at bedtime. 12/27/22   [provider]  tranexamic acid (LYSTEDA) 650 MG TABS tablet Take 2 tablets (1,300 mg total) by mouth 3 (three) times daily. 06/11/22   Dale Olimpo, MD  Family History Family History  Problem Relation Age of Onset   Hypertension Father    Breast cancer Maternal Grandmother        60-70   Breast cancer Paternal Grandmother        65-70   Diabetes Other        parent   Colon cancer Neg Hx     Social History Social History   Tobacco Use   Smoking status: Never   Smokeless tobacco: Never  Vaping Use   Vaping Use: Never used  Substance Use Topics   Alcohol use: No    Alcohol/week: 0.0 standard drinks of alcohol   Drug use: No      Allergies   Sulfa antibiotics   Review of Systems Review of Systems  Constitutional:  Positive for fatigue. Negative for fever.  HENT:  Positive for congestion, postnasal drip, sinus pressure and sinus pain.   Respiratory:  Positive for cough. Negative for shortness of breath.   Cardiovascular:  Negative for chest pain and palpitations.  Gastrointestinal:  Negative for nausea and vomiting.  All other systems reviewed and are negative.    Physical Exam Triage Vital Signs ED Triage Vitals  Enc Vitals Group     BP      Pulse      Resp      Temp      Temp src      SpO2      Weight      Height      Head Circumference      Peak Flow      Pain Score      Pain Loc      Pain Edu?      Excl. in GC?    No data found.  Updated Vital Signs BP 106/74 (BP Location: Left Arm)   Pulse 68   Temp 98.7 F (37.1 C) (Oral)   LMP 04/09/2023 (Exact Date)   SpO2 97%   Visual Acuity Right Eye Distance:   Left Eye Distance:   Bilateral Distance:    Right Eye Near:   Left Eye Near:    Bilateral Near:     Physical Exam Vitals and nursing note reviewed.  Constitutional:      Appearance: Normal appearance. She is well-developed and well-groomed.  HENT:     Head: Normocephalic.     Right Ear: Tympanic membrane is retracted.     Left Ear: Tympanic membrane is retracted.     Nose: Congestion present.     Right Sinus: Maxillary sinus tenderness present.     Left Sinus: Maxillary sinus tenderness present.     Mouth/Throat:     Lips: Pink.     Mouth: Mucous membranes are moist.     Comments: +thick yellow PND Cardiovascular:     Rate and Rhythm: Normal rate and regular rhythm.     Pulses: Normal pulses.     Heart sounds: Normal heart sounds.  Pulmonary:     Effort: Pulmonary effort is normal.     Breath sounds: Normal breath sounds.  Neurological:     General: No focal deficit present.     Mental Status: She is alert and oriented to person, place, and time.     GCS:  GCS eye subscore is 4. GCS verbal subscore is 5. GCS motor subscore is 6.  Psychiatric:        Attention and Perception: Attention normal.        Mood and Affect: Mood  normal.        Speech: Speech normal.        Behavior: Behavior normal. Behavior is cooperative.      UC Treatments / Results  Labs (all labs ordered are listed, but only abnormal results are displayed) Labs Reviewed - No data to display  EKG   Radiology No results found.  Procedures Procedures (including critical care time)  Medications Ordered in UC Medications - No data to display  Initial Impression / Assessment and Plan / UC Course  I have reviewed the triage vital signs and the nursing notes.  Pertinent labs & imaging results that were available during my care of the patient were reviewed by me and considered in my medical decision making (see chart for details).    Discussed plan of care with pt, pt verbalized understanding to this provider. Will use zyrtec,flonase,Augmentin, follow up with PCP/hematologist.   Ddx: Acute URI, Sinusitis, allergies,viral illness, anemia Final Clinical Impressions(s) / UC Diagnoses   Final diagnoses:  Nasal congestion  Fatigue, unspecified type  Acute URI     Discharge Instructions      Please follow up with PCP and hematologist regarding anemia management-call for appt. Your vital signs are normal in office today. We are treating you for URI. Take antibiotic as directed until completed. Continue daily allergy med, may use flonase nasal spray to help with symptoms. Drink plenty of water.      ED Prescriptions     Medication Sig Dispense Auth. Provider   amoxicillin-clavulanate (AUGMENTIN) 875-125 MG tablet Take 1 tablet by mouth every 12 (twelve) hours for 7 days. 14 tablet Aneesha Holloran, Para March, NP      PDMP not reviewed this encounter.   Clancy Gourd, NP 05/05/23 [phone]

## 2023-05-05 NOTE — ED Triage Notes (Addendum)
Pt presents to UC for fatigue, cough, runny nose onset x1 week ago, pt also reports headache, cough mostly dry. Pt has not been taking anything for sxs. Pt denies any recent sick contacts, no fevers.

## 2023-05-07 ENCOUNTER — Other Ambulatory Visit: Payer: Self-pay | Admitting: *Deleted

## 2023-05-07 DIAGNOSIS — D509 Iron deficiency anemia, unspecified: Secondary | ICD-10-CM

## 2023-05-08 ENCOUNTER — Inpatient Hospital Stay: Payer: Managed Care, Other (non HMO) | Attending: Internal Medicine | Admitting: Internal Medicine

## 2023-05-08 DIAGNOSIS — D500 Iron deficiency anemia secondary to blood loss (chronic): Secondary | ICD-10-CM | POA: Diagnosis present

## 2023-05-08 DIAGNOSIS — N920 Excessive and frequent menstruation with regular cycle: Secondary | ICD-10-CM | POA: Diagnosis present

## 2023-05-08 DIAGNOSIS — D509 Iron deficiency anemia, unspecified: Secondary | ICD-10-CM

## 2023-05-08 LAB — CBC WITH DIFFERENTIAL (CANCER CENTER ONLY)
Abs Immature Granulocytes: 0.01 10*3/uL (ref 0.00–0.07)
Basophils Absolute: 0 10*3/uL (ref 0.0–0.1)
Basophils Relative: 0 %
Eosinophils Absolute: 0.1 10*3/uL (ref 0.0–0.5)
Eosinophils Relative: 3 %
HCT: 39.5 % (ref 36.0–46.0)
Hemoglobin: 13.3 g/dL (ref 12.0–15.0)
Immature Granulocytes: 0 %
Lymphocytes Relative: 27 %
Lymphs Abs: 1.4 10*3/uL (ref 0.7–4.0)
MCH: 31.5 pg (ref 26.0–34.0)
MCHC: 33.7 g/dL (ref 30.0–36.0)
MCV: 93.6 fL (ref 80.0–100.0)
Monocytes Absolute: 0.4 10*3/uL (ref 0.1–1.0)
Monocytes Relative: 8 %
Neutro Abs: 3.3 10*3/uL (ref 1.7–7.7)
Neutrophils Relative %: 62 %
Platelet Count: 198 10*3/uL (ref 150–400)
RBC: 4.22 MIL/uL (ref 3.87–5.11)
RDW: 12.1 % (ref 11.5–15.5)
WBC Count: 5.3 10*3/uL (ref 4.0–10.5)
nRBC: 0 % (ref 0.0–0.2)

## 2023-05-08 LAB — FERRITIN: Ferritin: 31 ng/mL (ref 11–307)

## 2023-05-08 LAB — IRON AND TIBC
Iron: 93 ug/dL (ref 28–170)
Saturation Ratios: 26 % (ref 10.4–31.8)
TIBC: 363 ug/dL (ref 250–450)
UIBC: 270 ug/dL

## 2023-05-22 ENCOUNTER — Encounter: Payer: Self-pay | Admitting: Internal Medicine

## 2023-05-22 NOTE — Progress Notes (Signed)
Labs stable

## 2023-05-27 ENCOUNTER — Encounter: Payer: Self-pay | Admitting: Internal Medicine

## 2023-05-27 ENCOUNTER — Ambulatory Visit: Payer: Managed Care, Other (non HMO) | Admitting: Internal Medicine

## 2023-05-27 VITALS — BP 116/70 | HR 75 | Temp 97.9°F | Resp 16 | Ht 67.0 in | Wt 227.6 lb

## 2023-05-27 DIAGNOSIS — R5383 Other fatigue: Secondary | ICD-10-CM | POA: Diagnosis not present

## 2023-05-27 DIAGNOSIS — D509 Iron deficiency anemia, unspecified: Secondary | ICD-10-CM

## 2023-05-27 DIAGNOSIS — Z13220 Encounter for screening for lipoid disorders: Secondary | ICD-10-CM

## 2023-05-27 DIAGNOSIS — R7989 Other specified abnormal findings of blood chemistry: Secondary | ICD-10-CM

## 2023-05-27 DIAGNOSIS — Z23 Encounter for immunization: Secondary | ICD-10-CM

## 2023-05-27 DIAGNOSIS — M533 Sacrococcygeal disorders, not elsewhere classified: Secondary | ICD-10-CM

## 2023-05-27 DIAGNOSIS — K219 Gastro-esophageal reflux disease without esophagitis: Secondary | ICD-10-CM

## 2023-05-27 DIAGNOSIS — F439 Reaction to severe stress, unspecified: Secondary | ICD-10-CM

## 2023-05-27 DIAGNOSIS — I10 Essential (primary) hypertension: Secondary | ICD-10-CM

## 2023-05-27 LAB — CBC WITH DIFFERENTIAL/PLATELET
Basophils Absolute: 0 10*3/uL (ref 0.0–0.1)
Basophils Relative: 0.4 % (ref 0.0–3.0)
Eosinophils Absolute: 0.1 10*3/uL (ref 0.0–0.7)
Eosinophils Relative: 1.8 % (ref 0.0–5.0)
HCT: 40.5 % (ref 36.0–46.0)
Hemoglobin: 13.4 g/dL (ref 12.0–15.0)
Lymphocytes Relative: 27.5 % (ref 12.0–46.0)
Lymphs Abs: 1.5 10*3/uL (ref 0.7–4.0)
MCHC: 33 g/dL (ref 30.0–36.0)
MCV: 94.9 fl (ref 78.0–100.0)
Monocytes Absolute: 0.4 10*3/uL (ref 0.1–1.0)
Monocytes Relative: 7.3 % (ref 3.0–12.0)
Neutro Abs: 3.4 10*3/uL (ref 1.4–7.7)
Neutrophils Relative %: 63 % (ref 43.0–77.0)
Platelets: 216 10*3/uL (ref 150.0–400.0)
RBC: 4.27 Mil/uL (ref 3.87–5.11)
RDW: 12.4 % (ref 11.5–15.5)
WBC: 5.4 10*3/uL (ref 4.0–10.5)

## 2023-05-27 LAB — IBC + FERRITIN
Ferritin: 24.1 ng/mL (ref 10.0–291.0)
Iron: 94 ug/dL (ref 42–145)
Saturation Ratios: 26.9 % (ref 20.0–50.0)
TIBC: 350 ug/dL (ref 250.0–450.0)
Transferrin: 250 mg/dL (ref 212.0–360.0)

## 2023-05-27 LAB — HEPATIC FUNCTION PANEL
ALT: 64 U/L — ABNORMAL HIGH (ref 0–35)
AST: 36 U/L (ref 0–37)
Albumin: 4.3 g/dL (ref 3.5–5.2)
Alkaline Phosphatase: 80 U/L (ref 39–117)
Bilirubin, Direct: 0.2 mg/dL (ref 0.0–0.3)
Total Bilirubin: 0.5 mg/dL (ref 0.2–1.2)
Total Protein: 6.9 g/dL (ref 6.0–8.3)

## 2023-05-27 LAB — BASIC METABOLIC PANEL
BUN: 21 mg/dL (ref 6–23)
CO2: 26 mEq/L (ref 19–32)
Calcium: 9.3 mg/dL (ref 8.4–10.5)
Chloride: 106 mEq/L (ref 96–112)
Creatinine, Ser: 0.81 mg/dL (ref 0.40–1.20)
GFR: 87.88 mL/min (ref >60.00)
Glucose, Bld: 76 mg/dL (ref 70–99)
Potassium: 4.5 mEq/L (ref 3.5–5.1)
Sodium: 138 mEq/L (ref 135–145)

## 2023-05-27 MED ORDER — TRANEXAMIC ACID 650 MG PO TABS: 1300.0000 mg | ORAL_TABLET | Freq: Three times a day (TID) | ORAL | 1 refills | Status: DC

## 2023-05-27 NOTE — Assessment & Plan Note (Signed)
Given persistent pain and previous xray unrevealing, refer to ortho for further evaluation.

## 2023-05-27 NOTE — Progress Notes (Signed)
Subjective:    Patient ID: Erica Hernandez, female    DOB: May 06, 1976, 47 y.o.   MRN: [id number]  Patient here for  Chief Complaint  Patient presents with   Medical Management of Chronic Issues    HPI Here for scheduled follow up regarding hypertension and anemia. Has IDA.  Seeing Dr Donneta Romberg.  S/p iron infusions. Seeing GI (Dr Servando Snare).  S/p EGD 09/26/22 - Normal esophagus. A sleeve gastrectomy was found, characterized by healthy appearing mucosa. Normal examined duodenum. Colonoscopy 09/26/22 - entire colon normal. S/p capsule study 11/06/22. Unsure of results.  Increased stress recently with husband's health issues. She feels she is handling things relatively well.  No chest pain or sob.  Does report persistent fatigue.  Request to have hgb and iron checked today.  No abdominal pain or bowel change reported.  Still having heavy periods.  Takes tranexamic acid prn - with heavy periods.  Has seen gyn.  They recommended continuing to use this prn.  Is not following with them currently.  Discussed hysterectomy.  Wants to hold.     Past Medical History:  Diagnosis Date   Hypertension    during pregnancy   Past Surgical History:  Procedure Laterality Date   COLONOSCOPY N/A 09/26/2022   Procedure: COLONOSCOPY;  Surgeon: Midge Minium, MD;  Location: [facility];  Service: Endoscopy;  Laterality: N/A;   ESOPHAGOGASTRODUODENOSCOPY N/A 09/26/2022   Procedure: ESOPHAGOGASTRODUODENOSCOPY (EGD);  Surgeon: Midge Minium, MD;  Location: [facility];  Service: Endoscopy;  Laterality: N/A;   GIVENS CAPSULE STUDY N/A 11/06/2022   Procedure: GIVENS CAPSULE STUDY;  Surgeon: Midge Minium, MD;  Location: [facility] ENDOSCOPY;  Service: Endoscopy;  Laterality: N/A;   HERNIA REPAIR     SHOULDER SURGERY  03/30/11   TONSILLECTOMY  1984   Family History  Problem Relation Age of Onset   Hypertension Father    Breast cancer Maternal Grandmother        92-70   Breast cancer Paternal Grandmother         68-70   Diabetes Other        parent   Colon cancer Neg Hx    Social History   Socioeconomic History   Marital status: Married    Spouse name: Not on file   Number of children: 1   Years of education: Not on file   Highest education level: Not on file  Occupational History    Employer: [employer]  Tobacco Use   Smoking status: Never   Smokeless tobacco: Never  Vaping Use   Vaping status: Never Used  Substance and Sexual Activity   Alcohol use: No    Alcohol/week: 0.0 standard drinks of alcohol   Drug use: No   Sexual activity: Not on file  Other Topics Concern   Not on file  Social History Narrative   Lives in [place]; 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    Social Determinants of Health   Financial Resource Strain: Not on file  Food Insecurity: Not on file  Transportation Needs: Not on file  Physical Activity: Not on file  Stress: Not on file  Social Connections: Not on file     Review of Systems  Constitutional:  Positive for fatigue. Negative for appetite change and unexpected weight change.  HENT:  Negative for congestion and sinus pressure.   Respiratory:  Negative for cough, chest tightness and shortness of breath.   Cardiovascular:  Negative for chest pain and palpitations.  Gastrointestinal:  Negative  for abdominal pain, diarrhea, nausea and vomiting.  Genitourinary:  Negative for difficulty urinating and dysuria.  Musculoskeletal:  Negative for joint swelling and myalgias.  Skin:  Negative for color change and rash.  Neurological:  Negative for dizziness and headaches.  Psychiatric/Behavioral:  Negative for agitation and dysphoric mood.        Objective:     BP 116/70   Pulse 75   Temp 97.9 F (36.6 C)   Resp 16   Ht 5\' 7"  (1.702 m)   Wt 227 lb 9.6 oz (103.2 kg)   LMP 04/09/2023 (Exact Date)   SpO2 98%   BMI 35.65 kg/m  Wt Readings from Last 3 Encounters:  05/27/23 227 lb 9.6 oz (103.2 kg)  01/23/23 226 lb 6.4 oz (102.7  kg)  11/28/22 223 lb 12.3 oz (101.5 kg)    Physical Exam Vitals reviewed.  Constitutional:      General: She is not in acute distress.    Appearance: Normal appearance.  HENT:     Head: Normocephalic and atraumatic.     Right Ear: External ear normal.     Left Ear: External ear normal.  Eyes:     General: No scleral icterus.       Right eye: No discharge.        Left eye: No discharge.     Conjunctiva/sclera: Conjunctivae normal.  Neck:     Thyroid: No thyromegaly.  Cardiovascular:     Rate and Rhythm: Normal rate and regular rhythm.  Pulmonary:     Effort: No respiratory distress.     Breath sounds: Normal breath sounds. No wheezing.  Abdominal:     General: Bowel sounds are normal.     Palpations: Abdomen is soft.     Tenderness: There is no abdominal tenderness.  Musculoskeletal:        General: No swelling or tenderness.     Cervical back: Neck supple. No tenderness.  Lymphadenopathy:     Cervical: No cervical adenopathy.  Skin:    Findings: No erythema or rash.  Neurological:     Mental Status: She is alert.  Psychiatric:        Mood and Affect: Mood normal.        Behavior: Behavior normal.      Outpatient Encounter Medications as of 05/27/2023  Medication Sig   Biotin 10 MG CAPS Take by mouth.   Calcium Carbonate-Vitamin D3 600-400 MG-UNIT TABS Take 1 mg by mouth daily.   cetirizine (ZYRTEC) 10 MG tablet Take by mouth.   [employer] FIBER GUMMIES PO Take by mouth.   pantoprazole (PROTONIX) 40 MG tablet Take 40 mg by mouth at bedtime.   tranexamic acid (LYSTEDA) 650 MG TABS tablet Take 2 tablets (1,300 mg total) by mouth 3 (three) times daily. As needed.   [DISCONTINUED] tranexamic acid (LYSTEDA) 650 MG TABS tablet Take 2 tablets (1,300 mg total) by mouth 3 (three) times daily.   No facility-administered encounter medications on file as of 05/27/2023.     Lab Results  Component Value Date   WBC 5.4 05/27/2023   HGB 13.4 05/27/2023   HCT 40.5 05/27/2023   PLT  216.0 05/27/2023   GLUCOSE 76 05/27/2023   CHOL 137 11/26/2022   TRIG 57.0 11/26/2022   HDL 55.50 11/26/2022   LDLCALC 70 11/26/2022   ALT 64 (H) 05/27/2023   AST 36 05/27/2023   NA 138 05/27/2023   K 4.5 05/27/2023   CL 106 05/27/2023   CREATININE 0.81  05/27/2023   BUN 21 05/27/2023   CO2 26 05/27/2023   TSH 1.78 11/26/2022   HGBA1C 5.0 03/03/2022    No results found.     Assessment & Plan:  Abnormal liver function test Assessment & Plan: Has lost weight previously.  Weight is stable.  Continue diet and exercise.  Recheck liver panel. Hepatitis C antibody was negative back in 2022.  She also had an ultrasound that did not show any abnormalities or increased echogenicity of the liver (03/11/22).  She also had her gallbladder removed at the time of the bariatric surgery.  No report of any Advil Aleve Motrin BCs or Goody powder use. No history of alcohol abuse or high risk activities such as IV drug use, homemade tattoos or high risk sexual activity with high risk individuals. Has seen GI previously.  GGT wnl.  If persistent elevation, consider referral back to GI for further w/up and evaluation.    Need for prophylactic vaccination against hepatitis A and hepatitis B -     Hepatitis A hepatitis B combined vaccine IM  Screening cholesterol level  Iron deficiency anemia, unspecified iron deficiency anemia type Assessment & Plan: History of bariatric surgery.   Seeing Dr. Brahmanday/hematology.  Has received IV iron.  Follow cbc and iron studies. Just had GI w/up as outlined.  Capsule study - results ?Marland Kitchen  Reports increased fatigue - persistent. Request hgb and iron check today.   Orders: -     CBC with Differential/Platelet -     IBC + Ferritin  Other fatigue Assessment & Plan: Discussed.  Reports increased fatigue.  Being followed by hematology.  Has received IV iron infusions.  Request to have cbc and iron studies today.  Discussed other possibilities.  Discussed possible sleep  apnea.  States has been rechecked previously and had resolved with weight loss.  Continue exercise.  Follow.    Orders: -     Hepatic function panel -     Basic metabolic panel  Tail bone pain Assessment & Plan: Given persistent pain and previous xray unrevealing, refer to ortho for further evaluation.   Orders: -     Ambulatory referral to Orthopedic Surgery  Gastroesophageal reflux disease without esophagitis Assessment & Plan: She is status post duodenal switch.  Is status post hiatal hernia repair February 2022.  Followed by Dr. Smitty Cords.  No swallowing problems now.  No acid reflux reported. Just had GI studies as outlined.     Hypertension, essential Assessment & Plan: Blood pressure doing well on no medication.  Follow.    Stress Assessment & Plan: Overall appears to be handling things well.  Follow.    Other orders -     Tranexamic Acid; Take 2 tablets (1,300 mg total) by mouth 3 (three) times daily. As needed.  Dispense: 30 tablet; Refill: 1     Dale [place], MD

## 2023-05-28 ENCOUNTER — Encounter: Payer: Self-pay | Admitting: Internal Medicine

## 2023-05-28 ENCOUNTER — Other Ambulatory Visit: Payer: Self-pay | Admitting: Internal Medicine

## 2023-05-28 DIAGNOSIS — R7989 Other specified abnormal findings of blood chemistry: Secondary | ICD-10-CM

## 2023-05-28 NOTE — Assessment & Plan Note (Signed)
History of bariatric surgery.   Seeing Dr. Brahmanday/hematology.  Has received IV iron.  Follow cbc and iron studies. Just had GI w/up as outlined.  Capsule study - results ?Marland Kitchen  Reports increased fatigue - persistent. Request hgb and iron check today.

## 2023-05-28 NOTE — Assessment & Plan Note (Signed)
Discussed.  Reports increased fatigue.  Being followed by hematology.  Has received IV iron infusions.  Request to have cbc and iron studies today.  Discussed other possibilities.  Discussed possible sleep apnea.  States has been rechecked previously and had resolved with weight loss.  Continue exercise.  Follow.

## 2023-05-28 NOTE — Assessment & Plan Note (Signed)
Blood pressure doing well on no medication.  Follow.   

## 2023-05-28 NOTE — Assessment & Plan Note (Signed)
Overall appears to be handling things well.  Follow.  ?

## 2023-05-28 NOTE — Assessment & Plan Note (Signed)
She is status post duodenal switch.  Is status post hiatal hernia repair February 2022.  Followed by Dr. Darnell Level.  No swallowing problems now.  No acid reflux reported. Just had GI studies as outlined.

## 2023-05-28 NOTE — Assessment & Plan Note (Addendum)
Has lost weight previously.  Weight is stable.  Continue diet and exercise.  Recheck liver panel. Hepatitis C antibody was negative back in 2022.  She also had an ultrasound that did not show any abnormalities or increased echogenicity of the liver (03/11/22).  She also had her gallbladder removed at the time of the bariatric surgery.  No report of any Advil Aleve Motrin BCs or Goody powder use. No history of alcohol abuse or high risk activities such as IV drug use, homemade tattoos or high risk sexual activity with high risk individuals. Has seen GI previously.  GGT wnl.  If persistent elevation, consider referral back to GI for further w/up and evaluation.

## 2023-05-28 NOTE — Progress Notes (Signed)
Order placed for f/u lab.   

## 2023-06-22 ENCOUNTER — Other Ambulatory Visit (INDEPENDENT_AMBULATORY_CARE_PROVIDER_SITE_OTHER): Payer: Managed Care, Other (non HMO)

## 2023-06-22 DIAGNOSIS — R7989 Other specified abnormal findings of blood chemistry: Secondary | ICD-10-CM | POA: Diagnosis not present

## 2023-06-22 LAB — HEPATIC FUNCTION PANEL
ALT: 43 U/L — ABNORMAL HIGH (ref 0–35)
AST: 28 U/L (ref 0–37)
Albumin: 3.9 g/dL (ref 3.5–5.2)
Alkaline Phosphatase: 82 U/L (ref 39–117)
Bilirubin, Direct: 0.1 mg/dL (ref 0.0–0.3)
Total Bilirubin: 0.4 mg/dL (ref 0.2–1.2)
Total Protein: 6.3 g/dL (ref 6.0–8.3)

## 2023-06-26 ENCOUNTER — Encounter: Payer: Self-pay | Admitting: Internal Medicine

## 2023-06-26 ENCOUNTER — Encounter: Payer: Self-pay | Admitting: Emergency Medicine

## 2023-06-26 ENCOUNTER — Ambulatory Visit
Admission: EM | Admit: 2023-06-26 | Discharge: 2023-06-26 | Disposition: A | Discharge status: Home / Self Care | Payer: Managed Care, Other (non HMO) | Attending: Physician Assistant | Admitting: Physician Assistant

## 2023-06-26 DIAGNOSIS — J029 Acute pharyngitis, unspecified: Secondary | ICD-10-CM | POA: Insufficient documentation

## 2023-06-26 DIAGNOSIS — Z1152 Encounter for screening for COVID-19: Secondary | ICD-10-CM | POA: Diagnosis not present

## 2023-06-26 DIAGNOSIS — R051 Acute cough: Secondary | ICD-10-CM | POA: Insufficient documentation

## 2023-06-26 DIAGNOSIS — J069 Acute upper respiratory infection, unspecified: Secondary | ICD-10-CM | POA: Insufficient documentation

## 2023-06-26 LAB — SARS CORONAVIRUS 2 BY RT PCR: SARS Coronavirus 2 by RT PCR: NEGATIVE

## 2023-06-26 LAB — GROUP A STREP BY PCR: Group A Strep by PCR: NOT DETECTED

## 2023-06-26 MED ORDER — IPRATROPIUM BROMIDE 0.06 % NA SOLN: 2.0000 | Freq: Four times a day (QID) | NASAL | 0 refills | Status: DC

## 2023-06-26 MED ORDER — PSEUDOEPH-BROMPHEN-DM 30-2-10 MG/5ML PO SYRP
10.0000 mL | ORAL_SOLUTION | Freq: Four times a day (QID) | ORAL | 0 refills | Status: AC | PRN
Start: 2023-06-26 — End: 2023-07-03

## 2023-06-26 NOTE — ED Provider Notes (Signed)
[facility]    CSN: [id number] Arrival date & time: 06/26/23  0802      History   Chief Complaint Chief Complaint  Patient presents with   Headache   Sore Throat   Cough   Generalized Body Aches    HPI Erica Hernandez is a 47 y.o. female presenting for cough, congestion, sore throat, dizziness, headaches, fatigue, chest pressure/SOB when active since yesterday.  She denies any fevers, sinus pain, ear pain, abdominal pain, vomiting or diarrhea.  No sick contacts.  Has been taking Sudafed and Tylenol over-the-counter but no medications taken today.  Negative home COVID test.  Medical history significant for hypertension, iron deficiency anemia, and obesity, status post bariatric surgery 4 years ago.  HPI  Past Medical History:  Diagnosis Date   Hypertension    during pregnancy    Patient Active Problem List   Diagnosis Date Noted   Menorrhagia with irregular cycle 03/25/2023   Toenail fungus 09/22/2021   Fatigue 09/17/2021   BMI 29.0-29.9,adult 01/04/2021   Hiatal hernia 01/04/2021   Tail bone pain 11/17/2020   Dysphagia 11/17/2020   Gastroesophageal reflux disease without esophagitis 10/11/2020   Abnormal liver function test 10/11/2020   Iron deficiency 07/18/2020   Menstrual irregularity 03/10/2020   Iron deficiency anemia 01/10/2020   Cervical cancer screening 09/04/2019   Encounter for birth control 09/04/2019   Anemia 08/31/2019   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/29/2019   Myopia of both eyes 11/24/2018   Presbyopia 11/24/2018   Health care maintenance 10/14/2015   Abdominal cyst 04/23/2015   Hypertension, essential 11/19/2014   Stress 07/23/2014   Fullness in head 07/23/2014   Headache(784.0) 03/20/2014    Past Surgical History:  Procedure Laterality Date   COLONOSCOPY N/A 09/26/2022   Procedure: COLONOSCOPY;  Surgeon: Midge Minium, MD;  Location: [facility];  Service: Endoscopy;  Laterality: N/A;   ESOPHAGOGASTRODUODENOSCOPY N/A  09/26/2022   Procedure: ESOPHAGOGASTRODUODENOSCOPY (EGD);  Surgeon: Midge Minium, MD;  Location: [facility];  Service: Endoscopy;  Laterality: N/A;   GIVENS CAPSULE STUDY N/A 11/06/2022   Procedure: GIVENS CAPSULE STUDY;  Surgeon: Midge Minium, MD;  Location: [facility];  Service: Endoscopy;  Laterality: N/A;   HERNIA REPAIR     SHOULDER SURGERY  03/30/11   TONSILLECTOMY  1984    OB History   No obstetric history on file.      Home Medications    Prior to Admission medications   Medication Sig Start Date End Date Taking? Authorizing Provider  brompheniramine-pseudoephedrine-DM 30-2-10 MG/5ML syrup Take 10 mLs by mouth 4 (four) times daily as needed for up to 7 days. 06/26/23 07/03/23 Yes Eusebio Friendly B, PA-C  ipratropium (ATROVENT) 0.06 % nasal spray Place 2 sprays into both nostrils 4 (four) times daily. 06/26/23  Yes Shirlee Latch, PA-C  Biotin 10 MG CAPS Take by mouth.    [provider]  Calcium Carbonate-Vitamin D3 600-400 MG-UNIT TABS Take 1 mg by mouth daily.    [provider]  cetirizine (ZYRTEC) 10 MG tablet Take by mouth.    [provider]  [employer] FIBER GUMMIES PO Take by mouth.    [provider]  pantoprazole (PROTONIX) 40 MG tablet Take 40 mg by mouth at bedtime. 12/27/22   [provider]  tranexamic acid (LYSTEDA) 650 MG TABS tablet Take 2 tablets (1,300 mg total) by mouth 3 (three) times daily. As needed. 05/27/23   Dale McCarr, MD    Family History Family History  Problem Relation Age of Onset   Hypertension Father    Breast cancer Maternal Grandmother        19-70   Breast cancer Paternal Grandmother        16-70   Diabetes Other        parent   Colon cancer Neg Hx     Social History Social History   Tobacco Use   Smoking status: Never   Smokeless tobacco: Never  Vaping Use   Vaping status: Never Used  Substance Use Topics   Alcohol use: No    Alcohol/week: 0.0 standard drinks of alcohol    Drug use: No     Allergies   Sulfa antibiotics   Review of Systems Review of Systems  Constitutional:  Positive for fatigue. Negative for fever.  HENT:  Positive for congestion, rhinorrhea and sore throat. Negative for ear pain.   Respiratory:  Positive for cough and chest tightness. Negative for shortness of breath.   Cardiovascular:  Negative for chest pain and palpitations.  Gastrointestinal:  Negative for abdominal pain, diarrhea, nausea and vomiting.  Musculoskeletal:  Negative for arthralgias and myalgias.  Neurological:  Positive for dizziness and headaches. Negative for syncope and weakness.     Physical Exam Triage Vital Signs ED Triage Vitals  Enc Vitals Group     BP      Pulse      Resp      Temp      Temp src      SpO2      Weight      Height      Head Circumference      Peak Flow      Pain Score      Pain Loc      Pain Edu?      Excl. in GC?    No data found.  Updated Vital Signs BP 111/70 (BP Location: Left Arm)   Pulse 73   Temp 98.8 F (37.1 C) (Oral)   Resp 14   Ht 5\' 7"  (1.702 m)   Wt 227 lb (103 kg)   LMP 06/07/2023 (Approximate)   SpO2 96%   BMI 35.55 kg/m    Physical Exam Vitals and nursing note reviewed.  Constitutional:      General: She is not in acute distress.    Appearance: Normal appearance. She is not ill-appearing or toxic-appearing.  HENT:     Head: Normocephalic and atraumatic.     Nose: Congestion present.     Mouth/Throat:     Mouth: Mucous membranes are moist.     Pharynx: Oropharynx is clear. Posterior oropharyngeal erythema present.  Eyes:     General: No scleral icterus.       Right eye: No discharge.        Left eye: No discharge.     Conjunctiva/sclera: Conjunctivae normal.  Cardiovascular:     Rate and Rhythm: Normal rate and regular rhythm.     Heart sounds: Normal heart sounds.  Pulmonary:     Effort: Pulmonary effort is normal. No respiratory distress.     Breath sounds: Normal breath sounds.   Musculoskeletal:     Cervical back: Neck supple.  Skin:    General: Skin is dry.  Neurological:     General: No focal deficit present.     Mental Status: She is alert. Mental status is at baseline.     Motor: No weakness.     Gait: Gait normal.  Psychiatric:  Mood and Affect: Mood normal.        Behavior: Behavior normal.        Thought Content: Thought content normal.      UC Treatments / Results  Labs (all labs ordered are listed, but only abnormal results are displayed) Labs Reviewed  SARS CORONAVIRUS 2 BY RT PCR  GROUP A STREP BY PCR    EKG   Radiology No results found.  Procedures Procedures (including critical care time)  Medications Ordered in UC Medications - No data to display  Initial Impression / Assessment and Plan / UC Course  I have reviewed the triage vital signs and the nursing notes.  Pertinent labs & imaging results that were available during my care of the patient were reviewed by me and considered in my medical decision making (see chart for details).   47 year old female presents for fatigue, cough, congestion, sore throat, dizziness, chest pressure/SOB, and headaches since yesterday. She does not complain of any fevers, body aches, chest pain, wheezing, abdominal pain, vomiting or diarrhea.  History of iron deficiency anemia, hypertension, obesity status post bariatric surgery 4 years ago.  Vitals normal and stable and patient is overall well-appearing.  Has mild nasal congestion mild posterior pharyngeal erythema.  Heart regular rate and rhythm and chest clear to auscultation.    COVID test obtained. Negative. Strep test obtained. Negative.  Discussed results with patient. Likely viral URI. Low suspicion for pneumonia given no fever, normal O2 saturation, and clear chest, but if symptoms not improving or worsening, advised to return. Sent Bromfed DM and Atrovent nasal spray. Advised going to ER for chest pressure worsens or she has any  acute worsening of symptoms.   Final Clinical Impressions(s) / UC Diagnoses   Final diagnoses:  Viral upper respiratory tract infection  Acute cough  Sore throat     Discharge Instructions      URI/COLD SYMPTOMS: Your exam today is consistent with a viral illness. Antibiotics are not indicated at this time. Use medications as directed, including cough syrup, nasal saline, and decongestants. Your symptoms should improve over the next few days and resolve within 7-10 days. Increase rest and fluids. F/u if symptoms worsen or predominate such as sore throat, ear pain, productive cough, shortness of breath, or if you develop high fevers or worsening fatigue over the next several days.        ED Prescriptions     Medication Sig Dispense Auth. Provider   brompheniramine-pseudoephedrine-DM 30-2-10 MG/5ML syrup Take 10 mLs by mouth 4 (four) times daily as needed for up to 7 days. 150 mL Eusebio Friendly B, PA-C   ipratropium (ATROVENT) 0.06 % nasal spray Place 2 sprays into both nostrils 4 (four) times daily. 15 mL Shirlee Latch, PA-C      PDMP not reviewed this encounter.      Shirlee Latch, PA-C 06/26/23 [phone]

## 2023-06-26 NOTE — Discharge Instructions (Signed)

## 2023-06-26 NOTE — ED Triage Notes (Signed)
Patient c/o cough, sore throat, nasal congestion and bodyaches and headache that started yesterday.  Patient states that she had a negative home covid test yesterday.  Patient denies fevers.

## 2023-07-14 ENCOUNTER — Other Ambulatory Visit: Payer: Self-pay | Admitting: Internal Medicine

## 2023-07-14 DIAGNOSIS — Z1231 Encounter for screening mammogram for malignant neoplasm of breast: Secondary | ICD-10-CM

## 2023-07-29 ENCOUNTER — Inpatient Hospital Stay: Payer: Managed Care, Other (non HMO) | Attending: Internal Medicine

## 2023-07-29 DIAGNOSIS — D500 Iron deficiency anemia secondary to blood loss (chronic): Secondary | ICD-10-CM | POA: Insufficient documentation

## 2023-07-29 DIAGNOSIS — N920 Excessive and frequent menstruation with regular cycle: Secondary | ICD-10-CM | POA: Diagnosis present

## 2023-07-29 DIAGNOSIS — E611 Iron deficiency: Secondary | ICD-10-CM

## 2023-07-29 LAB — IRON AND TIBC
Iron: 51 ug/dL (ref 28–170)
Saturation Ratios: 15 % (ref 10.4–31.8)
TIBC: 343 ug/dL (ref 250–450)
UIBC: 292 ug/dL

## 2023-07-29 LAB — CBC WITH DIFFERENTIAL (CANCER CENTER ONLY)
Abs Immature Granulocytes: 0.01 10*3/uL (ref 0.00–0.07)
Basophils Absolute: 0 10*3/uL (ref 0.0–0.1)
Basophils Relative: 1 %
Eosinophils Absolute: 0.1 10*3/uL (ref 0.0–0.5)
Eosinophils Relative: 2 %
HCT: 38.2 % (ref 36.0–46.0)
Hemoglobin: 12.6 g/dL (ref 12.0–15.0)
Immature Granulocytes: 0 %
Lymphocytes Relative: 36 %
Lymphs Abs: 1.5 10*3/uL (ref 0.7–4.0)
MCH: 31 pg (ref 26.0–34.0)
MCHC: 33 g/dL (ref 30.0–36.0)
MCV: 93.9 fL (ref 80.0–100.0)
Monocytes Absolute: 0.3 10*3/uL (ref 0.1–1.0)
Monocytes Relative: 7 %
Neutro Abs: 2.3 10*3/uL (ref 1.7–7.7)
Neutrophils Relative %: 54 %
Platelet Count: 210 10*3/uL (ref 150–400)
RBC: 4.07 MIL/uL (ref 3.87–5.11)
RDW: 11.8 % (ref 11.5–15.5)
WBC Count: 4.3 10*3/uL (ref 4.0–10.5)
nRBC: 0 % (ref 0.0–0.2)

## 2023-07-29 LAB — BASIC METABOLIC PANEL - CANCER CENTER ONLY
Anion gap: 5 (ref 5–15)
BUN: 21 mg/dL — ABNORMAL HIGH (ref 6–20)
CO2: 26 mmol/L (ref 22–32)
Calcium: 9.3 mg/dL (ref 8.9–10.3)
Chloride: 105 mmol/L (ref 98–111)
Creatinine: 0.75 mg/dL (ref 0.44–1.00)
GFR, Estimated: >60 mL/min (ref >60)
Glucose, Bld: 94 mg/dL (ref 70–99)
Potassium: 3.8 mmol/L (ref 3.5–5.1)
Sodium: 136 mmol/L (ref 135–145)

## 2023-07-29 LAB — VITAMIN B12: Vitamin B-12: 2048 pg/mL — ABNORMAL HIGH (ref 180–914)

## 2023-07-29 LAB — FERRITIN: Ferritin: 17 ng/mL (ref 11–307)

## 2023-07-31 ENCOUNTER — Inpatient Hospital Stay (HOSPITAL_BASED_OUTPATIENT_CLINIC_OR_DEPARTMENT_OTHER): Payer: Managed Care, Other (non HMO) | Admitting: Internal Medicine

## 2023-07-31 ENCOUNTER — Inpatient Hospital Stay: Payer: Managed Care, Other (non HMO)

## 2023-07-31 ENCOUNTER — Encounter: Payer: Self-pay | Admitting: Internal Medicine

## 2023-07-31 VITALS — BP 107/76 | HR 88 | Temp 97.2°F | Resp 16 | Ht 67.0 in | Wt 222.0 lb

## 2023-07-31 VITALS — BP 109/66 | HR 77 | Resp 18

## 2023-07-31 DIAGNOSIS — D500 Iron deficiency anemia secondary to blood loss (chronic): Secondary | ICD-10-CM

## 2023-07-31 DIAGNOSIS — E611 Iron deficiency: Secondary | ICD-10-CM

## 2023-07-31 DIAGNOSIS — N920 Excessive and frequent menstruation with regular cycle: Secondary | ICD-10-CM | POA: Diagnosis not present

## 2023-07-31 DIAGNOSIS — Z803 Family history of malignant neoplasm of breast: Secondary | ICD-10-CM

## 2023-07-31 DIAGNOSIS — D509 Iron deficiency anemia, unspecified: Secondary | ICD-10-CM

## 2023-07-31 DIAGNOSIS — G4733 Obstructive sleep apnea (adult) (pediatric): Secondary | ICD-10-CM

## 2023-07-31 DIAGNOSIS — E538 Deficiency of other specified B group vitamins: Secondary | ICD-10-CM

## 2023-07-31 DIAGNOSIS — K909 Intestinal malabsorption, unspecified: Secondary | ICD-10-CM

## 2023-07-31 MED ORDER — SODIUM CHLORIDE 0.9 % IV SOLN
Freq: Once | INTRAVENOUS | Status: AC
  Filled 2023-07-31: qty 250

## 2023-07-31 MED ORDER — SODIUM CHLORIDE 0.9 % IV SOLN
200.0000 mg | Freq: Once | INTRAVENOUS | Status: AC
  Administered 2023-07-31: 200 mg via INTRAVENOUS
  Filled 2023-07-31: qty 200

## 2023-07-31 NOTE — Progress Notes (Signed)
Patient has no concerns today.

## 2023-07-31 NOTE — Progress Notes (Signed)
Pt has been educated and understands. Pt declined to stay 30 mins after iron infusion. VSS.

## 2023-07-31 NOTE — Assessment & Plan Note (Signed)
#  Iron deficiency anemia-multifactorial/malabsorption-bariatric surgery; heavy menstrual cycles. S/p IV iron.  On Bariatric advantage MVT.   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   # DISPOSITION: # venofer today-  # in 1 week venofer  # follow up in 6 months- MD; 2-3 days prior- labs- cbc/bmp;ironstudies/ferritin; B12 levels- possible venofer- Dr.B

## 2023-07-31 NOTE — Progress Notes (Signed)
[facility] CONSULT NOTE  Patient Care Team: Dale Ramos, MD as PCP - General (Internal Medicine) Earna Coder, MD as Consulting Physician (Oncology)  CHIEF COMPLAINTS/PURPOSE OF CONSULTATION:    HEMATOLOGY HISTORY  # IRON DEF ANEMIA -malabsorption /heavy menstrual cycles. 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  Also capsule study [last as per patient].   #Chronic fatigue- Dx: OSA; intolerant to CPAP; 2022-sleep study negative.    HISTORY OF PRESENTING ILLNESS: Alone.  Ambulating independently.  Erica Hernandez 47 y.o.  female with iron deficiency sec to malabsorption/duodenal switch/heavy menstrual cycles is here for follow-up.  Complains of ongoing fatigue.  No nausea vomiting.  Patient denies any blood in stools or black-colored stools.  Patient denies any nausea vomiting abdominal pain.  Review of Systems  Constitutional:  Positive for malaise/fatigue and weight loss (Intentional/gastric bypass). Negative for chills, diaphoresis and fever.  HENT:  Negative for nosebleeds and sore throat.   Eyes:  Negative for double vision.  Respiratory:  Negative for cough, hemoptysis, sputum production, shortness of breath and wheezing.   Cardiovascular:  Negative for chest pain, palpitations, orthopnea and leg swelling.  Gastrointestinal:  Negative for abdominal pain, blood in stool, constipation, diarrhea, heartburn, melena, nausea and vomiting.  Genitourinary:  Negative for dysuria, frequency and urgency.  Musculoskeletal:  Negative for back pain and joint pain.  Skin: Negative.  Negative for itching and rash.  Neurological:  Negative for dizziness, tingling, focal weakness, weakness and headaches.  Endo/Heme/Allergies:  Does not bruise/bleed easily.  Psychiatric/Behavioral:  Negative for  depression. The patient is not nervous/anxious and does not have insomnia.     MEDICAL HISTORY:  Past Medical History:  Diagnosis Date   Hypertension    during pregnancy    SURGICAL HISTORY: Past Surgical History:  Procedure Laterality Date   COLONOSCOPY N/A 09/26/2022   Procedure: COLONOSCOPY;  Surgeon: Midge Minium, MD;  Location: [facility];  Service: Endoscopy;  Laterality: N/A;   ESOPHAGOGASTRODUODENOSCOPY N/A 09/26/2022   Procedure: ESOPHAGOGASTRODUODENOSCOPY (EGD);  Surgeon: Midge Minium, MD;  Location: [facility];  Service: Endoscopy;  Laterality: N/A;   GIVENS CAPSULE STUDY N/A 11/06/2022   Procedure: GIVENS CAPSULE STUDY;  Surgeon: Midge Minium, MD;  Location: [facility];  Service: Endoscopy;  Laterality: N/A;   HERNIA REPAIR     SHOULDER SURGERY  03/30/11   TONSILLECTOMY  1984    SOCIAL HISTORY: Social History   Socioeconomic History   Marital status: Married    Spouse name: Not on file   Number of children: 1   Years of education: Not on file   Highest education level: Not on file  Occupational History    Employer: [employer]  Tobacco Use   Smoking status: Never   Smokeless tobacco: Never  Vaping Use   Vaping status: Never Used  Substance and Sexual Activity   Alcohol use: No    Alcohol/week: 0.0 standard drinks of alcohol   Drug use: No   Sexual activity: Not on file  Other Topics Concern   Not on file  Social History Narrative   Lives in [place]; 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    Social Determinants of Health   Financial Resource Strain: Not on file  Food Insecurity: Not on file  Transportation Needs: Not on file  Physical Activity: Not on file  Stress: Not on file  Social Connections: Not on file  Intimate Partner Violence: Not on file    FAMILY HISTORY: Family History  Problem Relation Age of Onset   Hypertension Father    Breast cancer Maternal Grandmother        60-70   Breast cancer Paternal  Grandmother        39-70   Diabetes Other        parent   Colon cancer Neg Hx     ALLERGIES:  is allergic to sulfa antibiotics.  MEDICATIONS:  Current Outpatient Medications  Medication Sig Dispense Refill   Calcium Carbonate-Vitamin D3 600-400 MG-UNIT TABS Take 1 mg by mouth daily.     cetirizine (ZYRTEC) 10 MG tablet Take by mouth.     [employer] FIBER GUMMIES PO Take by mouth.     phentermine 37.5 MG capsule Take 37.5 mg by mouth every morning.     tranexamic acid (LYSTEDA) 650 MG TABS tablet Take 2 tablets (1,300 mg total) by mouth 3 (three) times daily. As needed. 30 tablet 1   No current facility-administered medications for this visit.   Facility-Administered Medications Ordered in Other Visits  Medication Dose Route Frequency Provider Last Rate Last Admin   0.9 %  sodium chloride infusion   Intravenous Once Louretta Shorten R, MD       iron sucrose (VENOFER) 200 mg in sodium chloride 0.9 % 100 mL IVPB  200 mg Intravenous Once Louretta Shorten R, MD          PHYSICAL EXAMINATION:   Vitals:   07/31/23 1315  BP: 107/76  Pulse: 88  Resp: 16  Temp: (!) 97.2 F (36.2 C)  SpO2: 98%   Filed Weights   07/31/23 1315  Weight: 222 lb (100.7 kg)    Physical Exam HENT:     Head: Normocephalic and atraumatic.     Mouth/Throat:     Pharynx: No oropharyngeal exudate.  Eyes:     Pupils: Pupils are equal, round, and reactive to light.  Cardiovascular:     Rate and Rhythm: Normal rate and regular rhythm.  Pulmonary:     Effort: Pulmonary effort is normal. No respiratory distress.     Breath sounds: Normal breath sounds. No wheezing.  Abdominal:     General: Bowel sounds are normal. There is no distension.     Palpations: Abdomen is soft. There is no mass.     Tenderness: There is no abdominal tenderness. There is no guarding or rebound.  Musculoskeletal:        General: No tenderness. Normal range of motion.     Cervical back: Normal range of motion and neck supple.   Skin:    General: Skin is warm.  Neurological:     Mental Status: She is alert and oriented to person, place, and time.  Psychiatric:        Mood and Affect: Affect normal.     LABORATORY DATA:  I have reviewed the data as listed Lab Results  Component Value Date   WBC 4.3 07/29/2023   HGB 12.6 07/29/2023   HCT 38.2 07/29/2023   MCV 93.9 07/29/2023   PLT 210 07/29/2023   Recent Labs    09/18/22 1340 11/26/22 0825 03/16/23 1112 05/27/23 0825 06/22/23 0749 07/29/23 1301  NA 137 139 137 138  --  136  K 4.0 4.3 4.2 4.5  --  3.8  CL 105 106 107 106  --  105  CO2 26 27 24  26  --  26  GLUCOSE 87 82 95 76  --  94  BUN 22* 16 22* 21  --  21*  CREATININE 0.74 0.73 0.84 0.81  --  0.75  CALCIUM 9.1 8.8 8.5* 9.3  --  9.3  GFRNONAA >60  --  >60  --   --  >60  PROT 6.9 6.7 6.6 6.9 6.3  --   ALBUMIN 4.1 4.3 3.9 4.3 3.9  --   AST 24 30 23  36 28  --   ALT 30 50* 29 64* 43*  --   ALKPHOS 80 77 63 80 82  --   BILITOT 0.2* 0.5 0.4 0.5 0.4  --   BILIDIR  --  0.1  --  0.2 0.1  --      No results found.  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 S/p IV iron.  On Bariatric advantage MVT.   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   # DISPOSITION: # venofer today-  # in 1 week venofer  # follow up in 6 months- MD; 2-3 days prior- labs- cbc/bmp;ironstudies/ferritin; B12 levels- possible venofer- Dr.B   All questions were answered. The patient knows to call the clinic with any problems, questions or concerns.   Earna Coder, MD 07/31/2023 1:47 PM

## 2023-08-07 ENCOUNTER — Inpatient Hospital Stay: Payer: Managed Care, Other (non HMO)

## 2023-08-07 VITALS — BP 103/70 | HR 72 | Temp 97.3°F | Resp 18

## 2023-08-07 DIAGNOSIS — D500 Iron deficiency anemia secondary to blood loss (chronic): Secondary | ICD-10-CM | POA: Diagnosis not present

## 2023-08-07 DIAGNOSIS — E611 Iron deficiency: Secondary | ICD-10-CM

## 2023-08-07 DIAGNOSIS — D509 Iron deficiency anemia, unspecified: Secondary | ICD-10-CM

## 2023-08-07 MED ORDER — SODIUM CHLORIDE 0.9 % IV SOLN
Freq: Once | INTRAVENOUS | Status: AC
  Filled 2023-08-07: qty 250

## 2023-08-07 MED ORDER — SODIUM CHLORIDE 0.9 % IV SOLN
200.0000 mg | Freq: Once | INTRAVENOUS | Status: AC
  Administered 2023-08-07: 200 mg via INTRAVENOUS
  Filled 2023-08-07: qty 200

## 2023-08-07 NOTE — Patient Instructions (Signed)
Iron Sucrose Injection What is this medication? IRON SUCROSE (EYE ern SOO krose) treats low levels of iron (iron deficiency anemia) in people with kidney disease. Iron is a mineral that plays an important role in making red blood cells, which carry oxygen from your lungs to the rest of your body. This medicine may be used for other purposes; ask your health care provider or pharmacist if you have questions. COMMON BRAND NAME(S): Venofer What should I tell my care team before I take this medication? They need to know if you have any of these conditions: Anemia not caused by low iron levels Heart disease High levels of iron in the blood Kidney disease Liver disease An unusual or allergic reaction to iron, other medications, foods, dyes, or preservatives Pregnant or trying to get pregnant Breastfeeding How should I use this medication? This medication is for infusion into a vein. It is given in a hospital or clinic setting. Talk to your care team about the use of this medication in children. While this medication may be prescribed for children as young as 2 years for selected conditions, precautions do apply. Overdosage: If you think you have taken too much of this medicine contact a poison control center or emergency room at once. NOTE: This medicine is only for you. Do not share this medicine with others. What if I miss a dose? Keep appointments for follow-up doses. It is important not to miss your dose. Call your care team if you are unable to keep an appointment. What may interact with this medication? Do not take this medication with any of the following: Deferoxamine Dimercaprol Other iron products This medication may also interact with the following: Chloramphenicol Deferasirox This list may not describe all possible interactions. Give your health care provider a list of all the medicines, herbs, non-prescription drugs, or dietary supplements you use. Also tell them if you smoke,  drink alcohol, or use illegal drugs. Some items may interact with your medicine. What should I watch for while using this medication? Visit your care team regularly. Tell your care team if your symptoms do not start to get better or if they get worse. You may need blood work done while you are taking this medication. You may need to follow a special diet. Talk to your care team. Foods that contain iron include: whole grains/cereals, dried fruits, beans, or peas, leafy green vegetables, and organ meats (liver, kidney). What side effects may I notice from receiving this medication? Side effects that you should report to your care team as soon as possible: Allergic reactions--skin rash, itching, hives, swelling of the face, lips, tongue, or throat Low blood pressure--dizziness, feeling faint or lightheaded, blurry vision Shortness of breath Side effects that usually do not require medical attention (report to your care team if they continue or are bothersome): Flushing Headache Joint pain Muscle pain Nausea Pain, redness, or irritation at injection site This list may not describe all possible side effects. Call your doctor for medical advice about side effects. You may report side effects to FDA at 1-800-FDA-1088. Where should I keep my medication? This medication is given in a hospital or clinic. It will not be stored at home. NOTE: This sheet is a summary. It may not cover all possible information. If you have questions about this medicine, talk to your doctor, pharmacist, or health care provider.  2024 Elsevier/Gold Standard (2023-04-03 00:00:00)

## 2023-08-11 ENCOUNTER — Ambulatory Visit
Admission: RE | Admit: 2023-08-11 | Discharge: 2023-08-11 | Disposition: A | Discharge status: Home / Self Care | Payer: Managed Care, Other (non HMO) | Source: Ambulatory Visit | Attending: Internal Medicine | Admitting: Internal Medicine

## 2023-08-11 DIAGNOSIS — Z1231 Encounter for screening mammogram for malignant neoplasm of breast: Secondary | ICD-10-CM | POA: Diagnosis present

## 2023-08-13 ENCOUNTER — Other Ambulatory Visit: Payer: Self-pay | Admitting: Internal Medicine

## 2023-08-13 DIAGNOSIS — R928 Other abnormal and inconclusive findings on diagnostic imaging of breast: Secondary | ICD-10-CM

## 2023-08-13 NOTE — Progress Notes (Signed)
Order placed for f/u left breast mammogram and ultrasound.   

## 2023-08-28 ENCOUNTER — Ambulatory Visit
Admission: RE | Admit: 2023-08-28 | Discharge: 2023-08-28 | Disposition: A | Discharge status: Home / Self Care | Payer: Managed Care, Other (non HMO) | Source: Ambulatory Visit | Attending: Internal Medicine | Admitting: Internal Medicine

## 2023-08-28 DIAGNOSIS — R928 Other abnormal and inconclusive findings on diagnostic imaging of breast: Secondary | ICD-10-CM | POA: Insufficient documentation

## 2023-09-30 ENCOUNTER — Encounter: Payer: Self-pay | Admitting: Internal Medicine

## 2023-09-30 ENCOUNTER — Ambulatory Visit: Payer: Managed Care, Other (non HMO) | Admitting: Internal Medicine

## 2023-09-30 VITALS — BP 110/70 | HR 73 | Temp 98.0°F | Resp 16 | Ht 67.0 in | Wt 215.6 lb

## 2023-09-30 DIAGNOSIS — D509 Iron deficiency anemia, unspecified: Secondary | ICD-10-CM

## 2023-09-30 DIAGNOSIS — K219 Gastro-esophageal reflux disease without esophagitis: Secondary | ICD-10-CM

## 2023-09-30 DIAGNOSIS — F439 Reaction to severe stress, unspecified: Secondary | ICD-10-CM

## 2023-09-30 DIAGNOSIS — Z23 Encounter for immunization: Secondary | ICD-10-CM

## 2023-09-30 DIAGNOSIS — I10 Essential (primary) hypertension: Secondary | ICD-10-CM | POA: Diagnosis not present

## 2023-09-30 DIAGNOSIS — R7989 Other specified abnormal findings of blood chemistry: Secondary | ICD-10-CM | POA: Diagnosis not present

## 2023-09-30 DIAGNOSIS — H938X9 Other specified disorders of ear, unspecified ear: Secondary | ICD-10-CM

## 2023-09-30 DIAGNOSIS — R5383 Other fatigue: Secondary | ICD-10-CM

## 2023-09-30 DIAGNOSIS — Z713 Dietary counseling and surveillance: Secondary | ICD-10-CM

## 2023-09-30 LAB — BASIC METABOLIC PANEL
BUN: 19 mg/dL (ref 6–23)
CO2: 26 meq/L (ref 19–32)
Calcium: 9 mg/dL (ref 8.4–10.5)
Chloride: 107 meq/L (ref 96–112)
Creatinine, Ser: 0.75 mg/dL (ref 0.40–1.20)
GFR: 96.14 mL/min (ref >60.00)
Glucose, Bld: 83 mg/dL (ref 70–99)
Potassium: 4 meq/L (ref 3.5–5.1)
Sodium: 138 meq/L (ref 135–145)

## 2023-09-30 LAB — HEPATIC FUNCTION PANEL
ALT: 15 U/L (ref 0–35)
AST: 17 U/L (ref 0–37)
Albumin: 4.2 g/dL (ref 3.5–5.2)
Alkaline Phosphatase: 79 U/L (ref 39–117)
Bilirubin, Direct: 0.1 mg/dL (ref 0.0–0.3)
Total Bilirubin: 0.5 mg/dL (ref 0.2–1.2)
Total Protein: 6.3 g/dL (ref 6.0–8.3)

## 2023-09-30 LAB — CBC WITH DIFFERENTIAL/PLATELET
Basophils Absolute: 0 10*3/uL (ref 0.0–0.1)
Basophils Relative: 0.4 % (ref 0.0–3.0)
Eosinophils Absolute: 0.1 10*3/uL (ref 0.0–0.7)
Eosinophils Relative: 1.2 % (ref 0.0–5.0)
HCT: 40.5 % (ref 36.0–46.0)
Hemoglobin: 13.4 g/dL (ref 12.0–15.0)
Lymphocytes Relative: 25.4 % (ref 12.0–46.0)
Lymphs Abs: 1.3 10*3/uL (ref 0.7–4.0)
MCHC: 33.1 g/dL (ref 30.0–36.0)
MCV: 95.2 fL (ref 78.0–100.0)
Monocytes Absolute: 0.4 10*3/uL (ref 0.1–1.0)
Monocytes Relative: 6.8 % (ref 3.0–12.0)
Neutro Abs: 3.5 10*3/uL (ref 1.4–7.7)
Neutrophils Relative %: 66.2 % (ref 43.0–77.0)
Platelets: 210 10*3/uL (ref 150.0–400.0)
RBC: 4.25 Mil/uL (ref 3.87–5.11)
RDW: 13.4 % (ref 11.5–15.5)
WBC: 5.3 10*3/uL (ref 4.0–10.5)

## 2023-09-30 LAB — IBC + FERRITIN
Ferritin: 32.8 ng/mL (ref 10.0–291.0)
Iron: 75 ug/dL (ref 42–145)
Saturation Ratios: 23.7 % (ref 20.0–50.0)
TIBC: 316.4 ug/dL (ref 250.0–450.0)
Transferrin: 226 mg/dL (ref 212.0–360.0)

## 2023-09-30 LAB — LIPID PANEL
Cholesterol: 122 mg/dL (ref 0–200)
HDL: 47.3 mg/dL (ref >39.00)
LDL Cholesterol: 63 mg/dL (ref 0–99)
NonHDL: 74.93
Total CHOL/HDL Ratio: 3
Triglycerides: 60 mg/dL (ref 0.0–149.0)
VLDL: 12 mg/dL (ref 0.0–40.0)

## 2023-09-30 LAB — TSH: TSH: 1.46 u[IU]/mL (ref 0.35–5.50)

## 2023-09-30 MED ORDER — TRANEXAMIC ACID 650 MG PO TABS: 1300.0000 mg | ORAL_TABLET | Freq: Three times a day (TID) | ORAL | 0 refills | Status: DC

## 2023-09-30 NOTE — Progress Notes (Signed)
Subjective:    Patient ID: Erica Hernandez, female    DOB: 03-Mar-1976, 47 y.o.   MRN: [id number]  Patient here for  Chief Complaint  Patient presents with   Medical Management of Chronic Issues    HPI Here for a scheduled follow up - follow up regarding hypertension and anemia. Being followed - [facility] - weight loss.  Started phentermine and topamax 07/2023 - with documented weight of 225 pounds - has lost weight. Seeing Dr Donneta Romberg for IDA - receiving IV venofer. She reports she is doing relatively well. Does report fatigue today. Request to have iron levels checked today.  Also reports some fullness - ears.  No significant congestion. Takes zyrtec daily. No chest congestion or cough. No sob. No abdominal pain.  Bowels stable. Blood pressures doing well - averaging 117/73.   Past Medical History:  Diagnosis Date   Hypertension    during pregnancy   Past Surgical History:  Procedure Laterality Date   COLONOSCOPY N/A 09/26/2022   Procedure: COLONOSCOPY;  Surgeon: Midge Minium, MD;  Location: [facility];  Service: Endoscopy;  Laterality: N/A;   ESOPHAGOGASTRODUODENOSCOPY N/A 09/26/2022   Procedure: ESOPHAGOGASTRODUODENOSCOPY (EGD);  Surgeon: Midge Minium, MD;  Location: [facility];  Service: Endoscopy;  Laterality: N/A;   GIVENS CAPSULE STUDY N/A 11/06/2022   Procedure: GIVENS CAPSULE STUDY;  Surgeon: Midge Minium, MD;  Location: [facility];  Service: Endoscopy;  Laterality: N/A;   HERNIA REPAIR     SHOULDER SURGERY  03/30/11   TONSILLECTOMY  1984   Family History  Problem Relation Age of Onset   Hypertension Father    Breast cancer Maternal Grandmother        22-70   Breast cancer Paternal Grandmother        3-70   Diabetes Other        parent   Colon cancer Neg Hx    Social History   Socioeconomic History   Marital status: Married    Spouse name: Not on file   Number of children: 1   Years of education: Not on file   Highest education level:  12th grade  Occupational History    Employer: [employer]  Tobacco Use   Smoking status: Never   Smokeless tobacco: Never  Vaping Use   Vaping status: Never Used  Substance and Sexual Activity   Alcohol use: No    Alcohol/week: 0.0 standard drinks of alcohol   Drug use: No   Sexual activity: Not on file  Other Topics Concern   Not on file  Social History Narrative   Lives in [place]; 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    Social Determinants of Health   Financial Resource Strain: Low Risk  (09/28/2023)   Overall Financial Resource Strain (CARDIA)    Difficulty of Paying Living Expenses: Not very hard  Food Insecurity: No Food Insecurity (09/28/2023)   Hunger Vital Sign    Worried About Running Out of Food in the Last Year: Never true    Ran Out of Food in the Last Year: Never true  Transportation Needs: No Transportation Needs (09/28/2023)   PRAPARE - Administrator, Civil Service (Medical): No    Lack of Transportation (Non-Medical): No  Physical Activity: Sufficiently Active (09/28/2023)   Exercise Vital Sign    Days of Exercise per Week: 6 days    Minutes of Exercise per Session: 30 min  Stress: Patient Declined (09/28/2023)   [employer] of Occupational Health -  Occupational Stress Questionnaire    Feeling of Stress : Patient declined  Social Connections: Moderately Integrated (09/28/2023)   Social Connection and Isolation Panel [NHANES]    Frequency of Communication with Friends and Family: More than three times a week    Frequency of Social Gatherings with Friends and Family: More than three times a week    Attends Religious Services: 1 to 4 times per year    Active Member of [employer] or Organizations: No    Attends Engineer, structural: Not on file    Marital Status: Married     Review of Systems  Constitutional:  Positive for fatigue. Negative for appetite change and unexpected weight change.  HENT:  Negative for  congestion and sinus pressure.   Respiratory:  Negative for cough, chest tightness and shortness of breath.   Cardiovascular:  Negative for chest pain and palpitations.  Gastrointestinal:  Negative for abdominal pain, diarrhea, nausea and vomiting.  Genitourinary:  Negative for difficulty urinating and dysuria.  Musculoskeletal:  Negative for joint swelling and myalgias.  Skin:  Negative for color change and rash.  Neurological:  Negative for dizziness and headaches.  Psychiatric/Behavioral:  Negative for agitation and dysphoric mood.        Objective:     BP 110/70   Pulse 73   Temp 98 F (36.7 C)   Resp 16   Ht 5\' 7"  (1.702 m)   Wt 215 lb 9.6 oz (97.8 kg)   SpO2 98%   BMI 33.77 kg/m  Wt Readings from Last 3 Encounters:  09/30/23 215 lb 9.6 oz (97.8 kg)  07/31/23 222 lb (100.7 kg)  06/26/23 227 lb (103 kg)    Physical Exam Vitals reviewed.  Constitutional:      General: She is not in acute distress.    Appearance: Normal appearance.  HENT:     Head: Normocephalic and atraumatic.     Right Ear: Tympanic membrane, ear canal and external ear normal.     Left Ear: Tympanic membrane, ear canal and external ear normal.     Mouth/Throat:     Pharynx: No oropharyngeal exudate or posterior oropharyngeal erythema.  Eyes:     General: No scleral icterus.       Right eye: No discharge.        Left eye: No discharge.     Conjunctiva/sclera: Conjunctivae normal.  Neck:     Thyroid: No thyromegaly.  Cardiovascular:     Rate and Rhythm: Normal rate and regular rhythm.  Pulmonary:     Effort: No respiratory distress.     Breath sounds: Normal breath sounds. No wheezing.  Abdominal:     General: Bowel sounds are normal.     Palpations: Abdomen is soft.     Tenderness: There is no abdominal tenderness.  Musculoskeletal:        General: No swelling or tenderness.     Cervical back: Neck supple. No tenderness.  Lymphadenopathy:     Cervical: No cervical adenopathy.  Skin:     Findings: No erythema or rash.  Neurological:     Mental Status: She is alert.  Psychiatric:        Mood and Affect: Mood normal.        Behavior: Behavior normal.      Outpatient Encounter Medications as of 09/30/2023  Medication Sig   ferrous sulfate 325 (65 FE) MG tablet Take 325 mg by mouth daily with breakfast.   topiramate (TOPAMAX) 25 MG tablet Take  25 mg by mouth daily.   Calcium Carbonate-Vitamin D3 600-400 MG-UNIT TABS Take 1 mg by mouth daily.   cetirizine (ZYRTEC) 10 MG tablet Take by mouth.   [employer] FIBER GUMMIES PO Take by mouth.   phentermine 37.5 MG capsule Take 37.5 mg by mouth every morning.   tranexamic acid (LYSTEDA) 650 MG TABS tablet Take 2 tablets (1,300 mg total) by mouth 3 (three) times daily. As needed.   [DISCONTINUED] tranexamic acid (LYSTEDA) 650 MG TABS tablet Take 2 tablets (1,300 mg total) by mouth 3 (three) times daily. As needed.   No facility-administered encounter medications on file as of 09/30/2023.     Lab Results  Component Value Date   WBC 5.3 09/30/2023   HGB 13.4 09/30/2023   HCT 40.5 09/30/2023   PLT 210.0 09/30/2023   GLUCOSE 83 09/30/2023   CHOL 122 09/30/2023   TRIG 60.0 09/30/2023   HDL 47.30 09/30/2023   LDLCALC 63 09/30/2023   ALT 15 09/30/2023   AST 17 09/30/2023   NA 138 09/30/2023   K 4.0 09/30/2023   CL 107 09/30/2023   CREATININE 0.75 09/30/2023   BUN 19 09/30/2023   CO2 26 09/30/2023   TSH 1.46 09/30/2023   HGBA1C 5.0 03/03/2022    MM 3D DIAGNOSTIC MAMMOGRAM UNILATERAL LEFT BREAST  Result Date: 08/28/2023 CLINICAL DATA:  Screening recall for a possible left breast mass. EXAM: DIGITAL DIAGNOSTIC UNILATERAL LEFT MAMMOGRAM WITH TOMOSYNTHESIS AND CAD; ULTRASOUND LEFT BREAST LIMITED TECHNIQUE: Left digital diagnostic mammography and breast tomosynthesis was performed. The images were evaluated with computer-aided detection. ; Targeted ultrasound examination of the left breast was performed. COMPARISON:  Previous  exam(s). ACR Breast Density Category b: There are scattered areas of fibroglandular density. FINDINGS: Spot compression tomosynthesis images of the central left breast demonstrates a low-density oval mass with indistinct margins measuring approximately 6 mm. In the slightly medial aspect of the left breast, there is another 4-5 mm low-density oval mass. Ultrasound of the retroareolar left breast at 12 o'clock demonstrates an anechoic oval circumscribed mass measuring 6 x 3 x 6 mm. Ultrasound of the left breast at 9 o'clock, 7 cm from the nipple demonstrates an anechoic oval circumscribed mass measuring 6 x 3 x 6 mm. IMPRESSION: Benign fibrocystic changes in the left breast. RECOMMENDATION: Screening mammogram in one year.(Code:SM-B-01Y) I have discussed the findings and recommendations with the patient. If applicable, a reminder letter will be sent to the patient regarding the next appointment. BI-RADS CATEGORY  2: Benign. Electronically Signed   By: Frederico Hamman M.D.   On: 08/28/2023 16:12   [place] LIMITED ULTRASOUND INCLUDING AXILLA LEFT BREAST   Result Date: 08/28/2023 CLINICAL DATA:  Screening recall for a possible left breast mass. EXAM: DIGITAL DIAGNOSTIC UNILATERAL LEFT MAMMOGRAM WITH TOMOSYNTHESIS AND CAD; ULTRASOUND LEFT BREAST LIMITED TECHNIQUE: Left digital diagnostic mammography and breast tomosynthesis was performed. The images were evaluated with computer-aided detection. ; Targeted ultrasound examination of the left breast was performed. COMPARISON:  Previous exam(s). ACR Breast Density Category b: There are scattered areas of fibroglandular density. FINDINGS: Spot compression tomosynthesis images of the central left breast demonstrates a low-density oval mass with indistinct margins measuring approximately 6 mm. In the slightly medial aspect of the left breast, there is another 4-5 mm low-density oval mass. Ultrasound of the retroareolar left breast at 12 o'clock demonstrates an anechoic oval  circumscribed mass measuring 6 x 3 x 6 mm. Ultrasound of the left breast at 9 o'clock, 7 cm from the nipple demonstrates  an anechoic oval circumscribed mass measuring 6 x 3 x 6 mm. IMPRESSION: Benign fibrocystic changes in the left breast. RECOMMENDATION: Screening mammogram in one year.(Code:SM-B-01Y) I have discussed the findings and recommendations with the patient. If applicable, a reminder letter will be sent to the patient regarding the next appointment. BI-RADS CATEGORY  2: Benign. Electronically Signed   By: Frederico Hamman M.D.   On: 08/28/2023 16:12       Assessment & Plan:  Hypertension, essential Assessment & Plan: Blood pressure doing well on no medication.  Follow.   Orders: -     Lipid panel -     Basic metabolic panel -     TSH  Abnormal liver function test -     Hepatic function panel  Iron deficiency anemia, unspecified iron deficiency anemia type Assessment & Plan: History of bariatric surgery.   Seeing Dr. Brahmanday/hematology.  Has received IV iron.  Follow cbc and iron studies. Has had GI w/up. Reports increased fatigue - persistent. Request hgb and iron check today.   Orders: -     CBC with Differential/Platelet -     IBC + Ferritin  Need for influenza vaccination -     Flu vaccine trivalent PF, 6mos and older(Flulaval,Afluria,Fluarix,Fluzone)  Stress Assessment & Plan: Overall appears to be handling things well.  Follow.    Gastroesophageal reflux disease without esophagitis Assessment & Plan: She is status post duodenal switch.  Is status post hiatal hernia repair February 2022.  Followed by Dr. Smitty Cords.  No swallowing problems now.  No acid reflux reported.    Other fatigue Assessment & Plan: Discussed.  Reports increased fatigue.  Being followed by hematology.  Has received IV iron infusions.  Request to have cbc and iron studies today.     Encounter for weight loss counseling Assessment & Plan: Being followed [facility] - weight loss - on  phentermine and topamax.    Sensation of fullness in ear, unspecified laterality Assessment & Plan: Steroid nasal spray.  Follow.    Other orders -     Tranexamic Acid; Take 2 tablets (1,300 mg total) by mouth 3 (three) times daily. As needed.  Dispense: 30 tablet; Refill: 0     Dale Dover, MD

## 2023-09-30 NOTE — Assessment & Plan Note (Signed)
Overall appears to be handling things well.  Follow.  ?

## 2023-09-30 NOTE — Assessment & Plan Note (Signed)
Blood pressure doing well on no medication.  Follow.   

## 2023-09-30 NOTE — Assessment & Plan Note (Signed)
Steroid nasal spray.  Follow.

## 2023-09-30 NOTE — Assessment & Plan Note (Signed)
She is status post duodenal switch.  Is status post hiatal hernia repair February 2022.  Followed by Dr. Smitty Cords.  No swallowing problems now.  No acid reflux reported.

## 2023-09-30 NOTE — Assessment & Plan Note (Signed)
History of bariatric surgery.   Seeing Dr. Brahmanday/hematology.  Has received IV iron.  Follow cbc and iron studies. Has had GI w/up. Reports increased fatigue - persistent. Request hgb and iron check today.

## 2023-09-30 NOTE — Assessment & Plan Note (Signed)
Discussed.  Reports increased fatigue.  Being followed by hematology.  Has received IV iron infusions.  Request to have cbc and iron studies today.

## 2023-09-30 NOTE — Assessment & Plan Note (Signed)
Being followed [facility] - weight loss - on phentermine and topamax.

## 2023-10-28 ENCOUNTER — Encounter: Payer: Self-pay | Admitting: Internal Medicine

## 2023-10-29 ENCOUNTER — Ambulatory Visit
Admission: EM | Admit: 2023-10-29 | Discharge: 2023-10-29 | Disposition: A | Discharge status: Home / Self Care | Payer: Managed Care, Other (non HMO) | Attending: Emergency Medicine | Admitting: Emergency Medicine

## 2023-10-29 ENCOUNTER — Encounter: Payer: Self-pay | Admitting: Emergency Medicine

## 2023-10-29 DIAGNOSIS — J309 Allergic rhinitis, unspecified: Secondary | ICD-10-CM | POA: Diagnosis present

## 2023-10-29 DIAGNOSIS — R5383 Other fatigue: Secondary | ICD-10-CM | POA: Diagnosis present

## 2023-10-29 DIAGNOSIS — R0982 Postnasal drip: Secondary | ICD-10-CM | POA: Diagnosis present

## 2023-10-29 DIAGNOSIS — R42 Dizziness and giddiness: Secondary | ICD-10-CM | POA: Diagnosis present

## 2023-10-29 LAB — CBC WITH DIFFERENTIAL/PLATELET
Abs Immature Granulocytes: 0.03 10*3/uL (ref 0.00–0.07)
Basophils Absolute: 0 10*3/uL (ref 0.0–0.1)
Basophils Relative: 0 %
Eosinophils Absolute: 0.1 10*3/uL (ref 0.0–0.5)
Eosinophils Relative: 2 %
HCT: 41.7 % (ref 36.0–46.0)
Hemoglobin: 14.2 g/dL (ref 12.0–15.0)
Immature Granulocytes: 1 %
Lymphocytes Relative: 27 %
Lymphs Abs: 1.4 10*3/uL (ref 0.7–4.0)
MCH: 31.6 pg (ref 26.0–34.0)
MCHC: 34.1 g/dL (ref 30.0–36.0)
MCV: 92.7 fL (ref 80.0–100.0)
Monocytes Absolute: 0.3 10*3/uL (ref 0.1–1.0)
Monocytes Relative: 6 %
Neutro Abs: 3.2 10*3/uL (ref 1.7–7.7)
Neutrophils Relative %: 64 %
Platelets: 209 10*3/uL (ref 150–400)
RBC: 4.5 MIL/uL (ref 3.87–5.11)
RDW: 12.4 % (ref 11.5–15.5)
WBC: 5 10*3/uL (ref 4.0–10.5)
nRBC: 0 % (ref 0.0–0.2)

## 2023-10-29 LAB — IRON AND TIBC
Iron: 69 ug/dL (ref 28–170)
Saturation Ratios: 20 % (ref 10.4–31.8)
TIBC: 340 ug/dL (ref 250–450)
UIBC: 271 ug/dL

## 2023-10-29 LAB — FERRITIN: Ferritin: 29 ng/mL (ref 11–307)

## 2023-10-29 MED ORDER — FLUTICASONE PROPIONATE 50 MCG/ACT NA SUSP: 2.0000 | Freq: Every day | NASAL | 1 refills | Status: DC

## 2023-10-29 MED ORDER — IPRATROPIUM BROMIDE 0.06 % NA SOLN: 2.0000 | Freq: Four times a day (QID) | NASAL | 12 refills | Status: DC

## 2023-10-29 NOTE — Discharge Instructions (Addendum)
I suspect that your dizziness, fatigue, headache, and palpitations may be related to low iron stores so we have drawn your blood and those values are pending.  They will appear in your MyChart and I recommend you follow-up with your hematologist when the results are back.  With regards to your iron supplementation I would change her regimen to every other day and make sure you are taking the iron tablet with something acidic such as orange juice to help your body better absorb the elemental iron.  With regards to your allergic rhinitis I would continue your Zyrtec daily and add on the Flonase nasal spray at bedtime.  He will take 2 squirts of each nostril at bedtime to help decrease your inflamed nasal tissues and counteract nasal allergy symptoms.  I am also going to add on Atrovent nasal spray that you can use periodically throughout the day for nasal congestion and postnasal drip.  You may do 2 squirts of each nostril every 6 hours as needed.  If you develop any severe headache, chest pain, shortness breath, or fainting you should call 911 and go to the ER.

## 2023-10-29 NOTE — ED Provider Notes (Signed)
[facility]    CSN: [id number] Arrival date & time: 10/29/23  0806      History   Chief Complaint Chief Complaint  Patient presents with   Headache   Dizziness   Tachycardia   Fatigue   Nasal Congestion    HPI Erica Hernandez is a 47 y.o. female.   HPI  47 year old female with a past medical history significant for IDA, GERD, status post bariatric surgery, hypertension, and fatigue presents for evaluation of 4 days worth of dizziness, headache, fatigue, and palpitations.  She does wear an Scientist, physiological and states her Apple Watch has not alerted her but she notices that when she is at rest that she will have episodes of palpitations.  She has not had any chest pain, shortness breath, or syncope.  The patient reports that she suspects that her iron may be low.  She contacted hematology but they are unable to see her until after the new year so she came to the urgent care for evaluation.  Additionally, she has been experiencing nasal congestion with postnasal drip for several weeks.  No nasal discharge or cough.  She does have a history of seasonal allergies and takes Zyrtec daily.  Past Medical History:  Diagnosis Date   Hypertension    during pregnancy    Patient Active Problem List   Diagnosis Date Noted   Encounter for weight loss counseling 09/30/2023   Ear fullness 09/30/2023   Menorrhagia with irregular cycle 03/25/2023   Toenail fungus 09/22/2021   Fatigue 09/17/2021   BMI 29.0-29.9,adult 01/04/2021   Hiatal hernia 01/04/2021   Tail bone pain 11/17/2020   Dysphagia 11/17/2020   Gastroesophageal reflux disease without esophagitis 10/11/2020   Abnormal liver function test 10/11/2020   Iron deficiency 07/18/2020   Menstrual irregularity 03/10/2020   Iron deficiency anemia 01/10/2020   Cervical cancer screening 09/04/2019   Encounter for birth control 09/04/2019   Anemia 08/31/2019   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/29/2019   Myopia of both eyes  11/24/2018   Presbyopia 11/24/2018   Health care maintenance 10/14/2015   Abdominal cyst 04/23/2015   Hypertension, essential 11/19/2014   Stress 07/23/2014   Fullness in head 07/23/2014   Headache 03/20/2014    Past Surgical History:  Procedure Laterality Date   COLONOSCOPY N/A 09/26/2022   Procedure: COLONOSCOPY;  Surgeon: Midge Minium, MD;  Location: [facility];  Service: Endoscopy;  Laterality: N/A;   ESOPHAGOGASTRODUODENOSCOPY N/A 09/26/2022   Procedure: ESOPHAGOGASTRODUODENOSCOPY (EGD);  Surgeon: Midge Minium, MD;  Location: [facility];  Service: Endoscopy;  Laterality: N/A;   GIVENS CAPSULE STUDY N/A 11/06/2022   Procedure: GIVENS CAPSULE STUDY;  Surgeon: Midge Minium, MD;  Location: [facility];  Service: Endoscopy;  Laterality: N/A;   HERNIA REPAIR     SHOULDER SURGERY  03/30/11   TONSILLECTOMY  1984    OB History   No obstetric history on file.      Home Medications    Prior to Admission medications   Medication Sig Start Date End Date Taking? Authorizing Provider  fluticasone (FLONASE) 50 MCG/ACT nasal spray Place 2 sprays into both nostrils daily. 10/29/23  Yes Becky Augusta, NP  ipratropium (ATROVENT) 0.06 % nasal spray Place 2 sprays into both nostrils 4 (four) times daily. 10/29/23  Yes Becky Augusta, NP  phentermine 37.5 MG capsule Take 37.5 mg by mouth every morning.   Yes [provider]  topiramate (TOPAMAX) 25 MG tablet Take 25 mg by mouth daily. 09/18/23  Yes [provider]  tranexamic acid (LYSTEDA) 650 MG TABS tablet Take 2 tablets (1,300 mg total) by mouth 3 (three) times daily. As needed. 09/30/23  Yes Dale Walden, MD  Calcium Carbonate-Vitamin D3 600-400 MG-UNIT TABS Take 1 mg by mouth daily.    [provider]  cetirizine (ZYRTEC) 10 MG tablet Take by mouth.    [provider]  [employer] FIBER GUMMIES PO Take by mouth.    [provider]  ferrous sulfate 325 (65 FE) MG tablet Take 325 mg  by mouth daily with breakfast.    [provider]    Family History Family History  Problem Relation Age of Onset   Hypertension Father    Breast cancer Maternal Grandmother        36-70   Breast cancer Paternal Grandmother        43-70   Diabetes Other        parent   Colon cancer Neg Hx     Social History Social History   Tobacco Use   Smoking status: Never   Smokeless tobacco: Never  Vaping Use   Vaping status: Never Used  Substance Use Topics   Alcohol use: No    Alcohol/week: 0.0 standard drinks of alcohol   Drug use: No     Allergies   Sulfa antibiotics   Review of Systems Review of Systems  Constitutional:  Negative for fever.  HENT:  Positive for congestion and postnasal drip. Negative for ear pain, rhinorrhea and sore throat.   Respiratory:  Negative for cough and shortness of breath.   Cardiovascular:  Positive for palpitations. Negative for chest pain.  Neurological:  Positive for dizziness, light-headedness and headaches.     Physical Exam Triage Vital Signs ED Triage Vitals  Encounter Vitals Group     BP 10/29/23 0818 117/80     Systolic BP Percentile --      Diastolic BP Percentile --      Pulse Rate 10/29/23 0818 97     Resp 10/29/23 0818 16     Temp 10/29/23 0818 98.4 F (36.9 C)     Temp Source 10/29/23 0818 Oral     SpO2 10/29/23 0818 98 %     Weight --      Height --      Head Circumference --      Peak Flow --      Pain Score 10/29/23 0816 3     Pain Loc --      Pain Education --      Exclude from Growth Chart --    No data found.  Updated Vital Signs BP 117/80   Pulse 97   Temp 98.4 F (36.9 C) (Oral)   Resp 16   LMP 10/10/2023   SpO2 98%   Visual Acuity Right Eye Distance:   Left Eye Distance:   Bilateral Distance:    Right Eye Near:   Left Eye Near:    Bilateral Near:     Physical Exam Vitals and nursing note reviewed.  Constitutional:      Appearance: Normal appearance. She is not ill-appearing.   HENT:     Head: Normocephalic and atraumatic.     Right Ear: Tympanic membrane, ear canal and external ear normal. There is no impacted cerumen.     Left Ear: Tympanic membrane, ear canal and external ear normal. There is no impacted cerumen.     Nose: Congestion and rhinorrhea present.     Comments:  This mucosa is edematous and pale with scant clear discharge in both nares.    Mouth/Throat:     Mouth: Mucous membranes are moist.     Pharynx: Oropharynx is clear. No oropharyngeal exudate or posterior oropharyngeal erythema.     Comments: Clear postnasal drip is present in the posterior oropharynx without erythema or injection of the tissue. Eyes:     Extraocular Movements: Extraocular movements intact.     Conjunctiva/sclera: Conjunctivae normal.     Pupils: Pupils are equal, round, and reactive to light.  Cardiovascular:     Rate and Rhythm: Normal rate and regular rhythm.     Pulses: Normal pulses.     Heart sounds: Normal heart sounds. No murmur heard.    No friction rub. No gallop.  Pulmonary:     Effort: Pulmonary effort is normal.     Breath sounds: Normal breath sounds. No wheezing, rhonchi or rales.  Musculoskeletal:     Cervical back: Normal range of motion and neck supple. No tenderness.  Lymphadenopathy:     Cervical: No cervical adenopathy.  Skin:    General: Skin is warm and dry.     Capillary Refill: Capillary refill takes less than 2 seconds.     Coloration: Skin is pale.     Findings: No erythema or rash.  Neurological:     General: No focal deficit present.     Mental Status: She is alert and oriented to person, place, and time.      UC Treatments / Results  Labs (all labs ordered are listed, but only abnormal results are displayed) Labs Reviewed  CBC WITH DIFFERENTIAL/PLATELET  IRON AND TIBC  FERRITIN    EKG Normal sinus rhythm with ventricular rate of 83 bpm PR interval 132 ms Show restriction 82 ms QT/QTc 358/420 ms No ST or T wave  abnormalities noted.  Radiology No results found.  Procedures Procedures (including critical care time)  Medications Ordered in UC Medications - No data to display  Initial Impression / Assessment and Plan / UC Course  I have reviewed the triage vital signs and the nursing notes.  Pertinent labs & imaging results that were available during my care of the patient were reviewed by me and considered in my medical decision making (see chart for details).   Patient is a pleasant, nontoxic-appearing 47 year old female presenting for evaluation of anemia symptoms that been present for the last 4 days.  She denies any syncope, chest pain, or shortness of breath.  She does endorse palpitations when she is at rest.  She does wear an Apple Watch and reports that her watch has not notified her of any abnormal heart rhythms.  She has not checked her app to see if there is any evidence of a fast heart rate.  Her cardiopulmonary exam reveals S1-S2 heart sounds with regular rate and rhythm and lung sounds that are clear to auscultation all fields.  The patient is slightly pale but has no significant abnormalities to the pigmentation of her conjunctiva or oral mucous membranes.  I will obtain a CBC to evaluate for the presence of anemia as well as an EKG to rule out any cardiac cause of her palpitations.  With regards to her nasal congestion and postnasal drip I suspect this is most likely secondary to allergies given the pill and edematous nature of her nasal mucosa.  I will have her continue to take Zyrtec daily and I will add on Flonase at bedtime as well as Atrovent  4 times daily as needed for congestion and PND.  EKG shows normal sinus rhythm without any T wave or ST abnormalities.  CBC shows a normal white count as well as normal H&H of 14.2 and 41.7.  Platelets are normal at 209.  I will order an iron panel and have the patient follow-up with her hematologist.  I discussed the findings of the CBC and EKG  with the patient.  Her symptoms may be a result of low iron and that will need to be corrected by hematology.  I did advise her to start taking her iron every other day for better absorption along with an acidic beverage such as orange juice to facilitate her bodies absorption of the iron.  For the allergic rhinitis she should continue her Zyrtec and I will add on Flonase and Atrovent nasal spray.  If she develops any chest pain, shortness of breath, increase in dizziness, or syncope she should call 911 and go to the ER.   Final Clinical Impressions(s) / UC Diagnoses   Final diagnoses:  Dizziness  Fatigue, unspecified type  Allergic rhinitis with postnasal drip     Discharge Instructions      I suspect that your dizziness, fatigue, headache, and palpitations may be related to low iron stores so we have drawn your blood and those values are pending.  They will appear in your MyChart and I recommend you follow-up with your hematologist when the results are back.  With regards to your iron supplementation I would change her regimen to every other day and make sure you are taking the iron tablet with something acidic such as orange juice to help your body better absorb the elemental iron.  With regards to your allergic rhinitis I would continue your Zyrtec daily and add on the Flonase nasal spray at bedtime.  He will take 2 squirts of each nostril at bedtime to help decrease your inflamed nasal tissues and counteract nasal allergy symptoms.  I am also going to add on Atrovent nasal spray that you can use periodically throughout the day for nasal congestion and postnasal drip.  You may do 2 squirts of each nostril every 6 hours as needed.  If you develop any severe headache, chest pain, shortness breath, or fainting you should call 911 and go to the ER.     ED Prescriptions     Medication Sig Dispense Auth. Provider   ipratropium (ATROVENT) 0.06 % nasal spray Place 2 sprays into both  nostrils 4 (four) times daily. 15 mL Becky Augusta, NP   fluticasone [facility]) 50 MCG/ACT nasal spray Place 2 sprays into both nostrils daily. 18.2 mL Becky Augusta, NP      PDMP not reviewed this encounter.   Becky Augusta, NP 10/29/23 [phone]

## 2023-10-29 NOTE — ED Triage Notes (Addendum)
Pt presents with dizziness, headache, fatigue and elevated heart rate x 4 days. Pt also c/o drainage and congestion for several weeks. Pt has not taken medication for her symptoms.

## 2023-12-09 ENCOUNTER — Encounter: Payer: Self-pay | Admitting: Emergency Medicine

## 2023-12-09 ENCOUNTER — Ambulatory Visit
Admission: EM | Admit: 2023-12-09 | Discharge: 2023-12-09 | Disposition: A | Discharge status: Home / Self Care | Payer: Managed Care, Other (non HMO) | Attending: Emergency Medicine | Admitting: Emergency Medicine

## 2023-12-09 DIAGNOSIS — J069 Acute upper respiratory infection, unspecified: Secondary | ICD-10-CM | POA: Diagnosis present

## 2023-12-09 LAB — SARS CORONAVIRUS 2 BY RT PCR: SARS Coronavirus 2 by RT PCR: NEGATIVE

## 2023-12-09 MED ORDER — BENZONATATE 100 MG PO CAPS: 200.0000 mg | ORAL_CAPSULE | Freq: Three times a day (TID) | ORAL | 0 refills | Status: DC

## 2023-12-09 MED ORDER — PROMETHAZINE-DM 6.25-15 MG/5ML PO SYRP: 5.0000 mL | ORAL_SOLUTION | Freq: Four times a day (QID) | ORAL | 0 refills | Status: DC | PRN

## 2023-12-09 MED ORDER — IPRATROPIUM BROMIDE 0.06 % NA SOLN: 2.0000 | Freq: Four times a day (QID) | NASAL | 12 refills | Status: DC

## 2023-12-09 NOTE — ED Triage Notes (Signed)
Pt presents with a cough, hoarse, runny nose, and headache x 4 days.

## 2023-12-09 NOTE — Discharge Instructions (Addendum)
Your COVID test was negative.  Your exam is consistent with an upper respiratory infection and it is most likely viral.  Please use over-the-counter Tylenol and/or ibuprofen according to the package instructions as needed for any pain or bodyaches you may experience.  Use the Atrovent nasal spray, 2 squirts in each nostril every 6 hours, as needed for runny nose and postnasal drip.  Use the Tessalon Perles every 8 hours during the day.  Take them with a small sip of water.  They may give you some numbness to the base of your tongue or a metallic taste in your mouth, this is normal.  Use the Promethazine DM cough syrup at bedtime for cough and congestion.  It will make you drowsy so do not take it during the day.  Return for reevaluation or see your primary care provider for any new or worsening symptoms.

## 2023-12-09 NOTE — ED Provider Notes (Signed)
[facility]    CSN: [id number] Arrival date & time: 12/09/23  0806      History   Chief Complaint Chief Complaint  Patient presents with   Cough   Headache   Hoarse   Nasal Congestion    HPI Erica Hernandez is a 48 y.o. female.   HPI  48 year old female with past medical history significant for essential hypertension, IDA, and GERD presents for evaluation of respiratory symptoms which began 4 days ago.  These include a headache with runny nose and clear nasal discharge, hoarseness, burning in her chest when she coughs, and a nonproductive cough.  She denies any fever, shortness breath, or wheezing.  No known sick contacts or recent travel.  Past Medical History:  Diagnosis Date   Hypertension    during pregnancy    Patient Active Problem List   Diagnosis Date Noted   Encounter for weight loss counseling 09/30/2023   Ear fullness 09/30/2023   Menorrhagia with irregular cycle 03/25/2023   Toenail fungus 09/22/2021   Fatigue 09/17/2021   BMI 29.0-29.9,adult 01/04/2021   Hiatal hernia 01/04/2021   Tail bone pain 11/17/2020   Dysphagia 11/17/2020   Gastroesophageal reflux disease without esophagitis 10/11/2020   Abnormal liver function test 10/11/2020   Iron deficiency 07/18/2020   Menstrual irregularity 03/10/2020   Iron deficiency anemia 01/10/2020   Cervical cancer screening 09/04/2019   Encounter for birth control 09/04/2019   Anemia 08/31/2019   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/29/2019   Myopia of both eyes 11/24/2018   Presbyopia 11/24/2018   Health care maintenance 10/14/2015   Abdominal cyst 04/23/2015   Hypertension, essential 11/19/2014   Stress 07/23/2014   Fullness in head 07/23/2014   Headache 03/20/2014    Past Surgical History:  Procedure Laterality Date   COLONOSCOPY N/A 09/26/2022   Procedure: COLONOSCOPY;  Surgeon: Midge Minium, MD;  Location: [facility];  Service: Endoscopy;  Laterality: N/A;    ESOPHAGOGASTRODUODENOSCOPY N/A 09/26/2022   Procedure: ESOPHAGOGASTRODUODENOSCOPY (EGD);  Surgeon: Midge Minium, MD;  Location: [facility];  Service: Endoscopy;  Laterality: N/A;   GIVENS CAPSULE STUDY N/A 11/06/2022   Procedure: GIVENS CAPSULE STUDY;  Surgeon: Midge Minium, MD;  Location: [facility] END[facility]COPY;  Service: Endoscopy;  Laterality: N/A;   HERNIA REPAIR     SHOULDER SURGERY  03/30/11   TONSILLECTOMY  1984    OB History   No obstetric history on file.      Home Medications    Prior to Admission medications   Medication Sig Start Date End Date Taking? Authorizing Provider  benzonatate (TESSALON) 100 MG capsule Take 2 capsules (200 mg total) by mouth every 8 (eight) hours. 12/09/23  Yes Becky Augusta, NP  promethazine-dextromethorphan (PROMETHAZINE-DM) 6.25-15 MG/5ML syrup Take 5 mLs by mouth 4 (four) times daily as needed. 12/09/23  Yes Becky Augusta, NP  Calcium Carbonate-Vitamin D3 600-400 MG-UNIT TABS Take 1 mg by mouth daily.    [provider]  cetirizine (ZYRTEC) 10 MG tablet Take by mouth.    [provider]  [employer] FIBER GUMMIES PO Take by mouth.    [provider]  ferrous sulfate 325 (65 FE) MG tablet Take 325 mg by mouth daily with breakfast.    [provider]  fluticasone (FLONASE) 50 MCG/ACT nasal spray Place 2 sprays into both nostrils daily. 10/29/23   Becky Augusta, NP  ipratropium (ATROVENT) 0.06 % nasal spray Place 2 sprays into both nostrils 4 (four) times daily. 12/09/23   Becky Augusta,  NP  phentermine 37.5 MG capsule Take 37.5 mg by mouth every morning.    [provider]  topiramate (TOPAMAX) 25 MG tablet Take 25 mg by mouth daily. 09/18/23   [provider]  tranexamic acid (LYSTEDA) 650 MG TABS tablet Take 2 tablets (1,300 mg total) by mouth 3 (three) times daily. As needed. 09/30/23   Dale , MD    Family History Family History  Problem Relation Age of Onset   Hypertension Father    Breast  cancer Maternal Grandmother        20-70   Breast cancer Paternal Grandmother        66-70   Diabetes Other        parent   Colon cancer Neg Hx     Social History Social History   Tobacco Use   Smoking status: Never   Smokeless tobacco: Never  Vaping Use   Vaping status: Never Used  Substance Use Topics   Alcohol use: No    Alcohol/week: 0.0 standard drinks of alcohol   Drug use: No     Allergies   Sulfa antibiotics   Review of Systems Review of Systems  Constitutional:  Negative for fever.  HENT:  Positive for congestion, rhinorrhea and voice change.   Respiratory:  Positive for cough. Negative for shortness of breath and wheezing.   Neurological:  Positive for headaches.     Physical Exam Triage Vital Signs ED Triage Vitals  Encounter Vitals Group     BP      Systolic BP Percentile      Diastolic BP Percentile      Pulse      Resp      Temp      Temp src      SpO2      Weight      Height      Head Circumference      Peak Flow      Pain Score      Pain Loc      Pain Education      Exclude from Growth Chart    No data found.  Updated Vital Signs BP 119/82 (BP Location: Right Arm)   Pulse 84   Temp 98.5 F (36.9 C) (Oral)   Resp 16   LMP 11/30/2023   SpO2 98%   Visual Acuity Right Eye Distance:   Left Eye Distance:   Bilateral Distance:    Right Eye Near:   Left Eye Near:    Bilateral Near:     Physical Exam Vitals and nursing note reviewed.  Constitutional:      Appearance: Normal appearance. She is not ill-appearing.  HENT:     Head: Normocephalic and atraumatic.     Right Ear: Tympanic membrane, ear canal and external ear normal. There is no impacted cerumen.     Left Ear: Tympanic membrane, ear canal and external ear normal. There is no impacted cerumen.     Nose: Congestion and rhinorrhea present.     Comments: Nasal mucosa is erythematous and edematous with scant clear discharge in both nares.    Mouth/Throat:     Mouth:  Mucous membranes are moist.     Pharynx: Oropharynx is clear. No oropharyngeal exudate or posterior oropharyngeal erythema.  Cardiovascular:     Rate and Rhythm: Normal rate and regular rhythm.     Pulses: Normal pulses.     Heart sounds: Normal heart sounds. No murmur heard.    No  friction rub. No gallop.  Pulmonary:     Effort: Pulmonary effort is normal.     Breath sounds: No wheezing, rhonchi or rales.  Musculoskeletal:     Cervical back: Normal range of motion and neck supple. No tenderness.  Lymphadenopathy:     Cervical: No cervical adenopathy.  Skin:    General: Skin is warm and dry.     Capillary Refill: Capillary refill takes less than 2 seconds.     Findings: No rash.  Neurological:     General: No focal deficit present.     Mental Status: She is alert and oriented to person, place, and time.      UC Treatments / Results  Labs (all labs ordered are listed, but only abnormal results are displayed) Labs Reviewed  SARS CORONAVIRUS 2 BY RT PCR    EKG   Radiology No results found.  Procedures Procedures (including critical care time)  Medications Ordered in UC Medications - No data to display  Initial Impression / Assessment and Plan / UC Course  I have reviewed the triage vital signs and the nursing notes.  Pertinent labs & imaging results that were available during my care of the patient were reviewed by me and considered in my medical decision making (see chart for details).   Patient is a pleasant, nontoxic-appearing 48 year old female presenting for evaluation of respiratory symptoms as outlined HPI above.  The patient is not in any acute distress and she is able to speak in full sentence with dyspnea or tachypnea.  Room air oxygen saturation is 98%.  She is currently afebrile with an oral temp of 98.5.  Her physical exam does reveal inflammation of her upper respiratory tract with scant clear nasal discharge.  Oropharyngeal exam and cardiopulmonary exam  are both benign.  Differential diagnosis include COVID, influenza, viral respiratory illness.  Given that she is on day 4 of symptoms I will not test her for influenza at this time but I will order a COVID PCR.  Given that her cough is nonproductive and her lung sounds are clear I do not feel chest x-ray is warranted at this time.  COVID PCR is negative.  I will discharge patient home with diagnosis of viral URI with a cough with prescription retronasal pain to help with nasal congestion.  Tessalon Perles and Promethazine DM cough syrup for cough and congestion.  Over-the-counter Tylenol and ibuprofen as needed for pain and bodyaches.  Return precautions reviewed.  Work note provided.   Final Clinical Impressions(s) / UC Diagnoses   Final diagnoses:  Viral URI with cough     Discharge Instructions      Your COVID test was negative.  Your exam is consistent with an upper respiratory infection and it is most likely viral.  Please use over-the-counter Tylenol and/or ibuprofen according to the package instructions as needed for any pain or bodyaches you may experience.  Use the Atrovent nasal spray, 2 squirts in each nostril every 6 hours, as needed for runny nose and postnasal drip.  Use the Tessalon Perles every 8 hours during the day.  Take them with a small sip of water.  They may give you some numbness to the base of your tongue or a metallic taste in your mouth, this is normal.  Use the Promethazine DM cough syrup at bedtime for cough and congestion.  It will make you drowsy so do not take it during the day.  Return for reevaluation or see your primary care provider  for any new or worsening symptoms.      ED Prescriptions     Medication Sig Dispense Auth. Provider   benzonatate (TESSALON) 100 MG capsule Take 2 capsules (200 mg total) by mouth every 8 (eight) hours. 21 capsule Becky Augusta, NP   ipratropium (ATROVENT) 0.06 % nasal spray Place 2 sprays into both nostrils 4 (four)  times daily. 15 mL Becky Augusta, NP   promethazine-dextromethorphan (PROMETHAZINE-DM) 6.25-15 MG/5ML syrup Take 5 mLs by mouth 4 (four) times daily as needed. 118 mL Becky Augusta, NP      PDMP not reviewed this encounter.   Becky Augusta, NP 12/09/23 [phone]

## 2023-12-12 ENCOUNTER — Telehealth: Payer: Managed Care, Other (non HMO) | Admitting: Family Medicine

## 2023-12-12 DIAGNOSIS — J40 Bronchitis, not specified as acute or chronic: Secondary | ICD-10-CM | POA: Diagnosis not present

## 2023-12-12 MED ORDER — BENZONATATE 200 MG PO CAPS: 200.0000 mg | ORAL_CAPSULE | Freq: Two times a day (BID) | ORAL | 0 refills | Status: DC | PRN

## 2023-12-12 MED ORDER — AZITHROMYCIN 250 MG PO TABS: ORAL_TABLET | ORAL | 0 refills | Status: AC

## 2023-12-12 NOTE — Progress Notes (Signed)

## 2024-01-21 ENCOUNTER — Encounter: Payer: Self-pay | Admitting: Internal Medicine

## 2024-01-21 NOTE — Telephone Encounter (Signed)
 Erica Hernandez, can you call her first before we fax the order to see if she will come here to do?

## 2024-01-22 ENCOUNTER — Encounter: Payer: Self-pay | Admitting: Internal Medicine

## 2024-01-22 ENCOUNTER — Encounter: Payer: Self-pay | Admitting: Emergency Medicine

## 2024-01-22 ENCOUNTER — Ambulatory Visit
Admission: EM | Admit: 2024-01-22 | Discharge: 2024-01-22 | Disposition: A | Discharge status: Home / Self Care | Attending: Emergency Medicine | Admitting: Emergency Medicine

## 2024-01-22 DIAGNOSIS — R42 Dizziness and giddiness: Secondary | ICD-10-CM | POA: Diagnosis not present

## 2024-01-22 MED ORDER — MECLIZINE HCL 25 MG PO TABS: 25.0000 mg | ORAL_TABLET | Freq: Three times a day (TID) | ORAL | 0 refills | Status: DC | PRN

## 2024-01-22 NOTE — ED Triage Notes (Signed)
 Patient reports dizziness and fatigue for the past 2 weeks.  Patient reports SOB that started yesterday.  Patient states that she called her Hematologist yesterday and had blood work done.  Patient states that the results came back this morning but came here instead waiting for their follow-up phone call.  Patient denies any pain.

## 2024-01-22 NOTE — ED Provider Notes (Signed)
 [facility]    CSN: [id number] Arrival date & time: 01/22/24  0804      History   Chief Complaint Chief Complaint  Patient presents with   Dizziness   Shortness of Breath    HPI Erica Hernandez is a 48 y.o. female presents due to having off balance dizziness in her head off and on for a few weeks, then this week noticed racing of her heart, but no chest pains but mild SOB. She thought it could be her iron being low, so her hematologist ordered labs yesterday and they are all normal. She admits she has been having post nasal drainage since January when she had a cold. Denies fever, N/V, body aches, HA or cough. Denies being stressed.     Past Medical History:  Diagnosis Date   Hypertension    during pregnancy    Patient Active Problem List   Diagnosis Date Noted   Encounter for weight loss counseling 09/30/2023   Ear fullness 09/30/2023   Menorrhagia with irregular cycle 03/25/2023   Toenail fungus 09/22/2021   Fatigue 09/17/2021   BMI 29.0-29.9,adult 01/04/2021   Hiatal hernia 01/04/2021   Tail bone pain 11/17/2020   Dysphagia 11/17/2020   Gastroesophageal reflux disease without esophagitis 10/11/2020   Abnormal liver function test 10/11/2020   Iron deficiency 07/18/2020   Menstrual irregularity 03/10/2020   Iron deficiency anemia 01/10/2020   Cervical cancer screening 09/04/2019   Encounter for birth control 09/04/2019   Anemia 08/31/2019   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/29/2019   Myopia of both eyes 11/24/2018   Presbyopia 11/24/2018   Health care maintenance 10/14/2015   Abdominal cyst 04/23/2015   Hypertension, essential 11/19/2014   Stress 07/23/2014   Fullness in head 07/23/2014   Headache 03/20/2014    Past Surgical History:  Procedure Laterality Date   COLONOSCOPY N/A 09/26/2022   Procedure: COLONOSCOPY;  Surgeon: Midge Minium, MD;  Location: [facility];  Service: Endoscopy;  Laterality: N/A;   ESOPHAGOGASTRODUODENOSCOPY N/A  09/26/2022   Procedure: ESOPHAGOGASTRODUODENOSCOPY (EGD);  Surgeon: Midge Minium, MD;  Location: [facility];  Service: Endoscopy;  Laterality: N/A;   GIVENS CAPSULE STUDY N/A 11/06/2022   Procedure: GIVENS CAPSULE STUDY;  Surgeon: Midge Minium, MD;  Location: [facility];  Service: Endoscopy;  Laterality: N/A;   HERNIA REPAIR     SHOULDER SURGERY  03/30/11   TONSILLECTOMY  1984    OB History   No obstetric history on file.      Home Medications    Prior to Admission medications   Medication Sig Start Date End Date Taking? Authorizing Provider  ferrous sulfate 325 (65 FE) MG tablet Take 325 mg by mouth daily with breakfast.   Yes [provider]  meclizine (ANTIVERT) 25 MG tablet Take 1 tablet (25 mg total) by mouth 3 (three) times daily as needed for dizziness. 01/22/24  Yes Rodriguez-Southworth, Nettie Elm, PA-C  phentermine 37.5 MG capsule Take 37.5 mg by mouth every morning.   Yes [provider]  topiramate (TOPAMAX) 25 MG tablet Take 25 mg by mouth daily. 09/18/23  Yes [provider]  Calcium Carbonate-Vitamin D3 600-400 MG-UNIT TABS Take 1 mg by mouth daily.    [provider]  cetirizine (ZYRTEC) 10 MG tablet Take by mouth.    [provider]  [employer] FIBER GUMMIES PO Take by mouth.    [provider]  fluticasone (FLONASE) 50 MCG/ACT nasal spray Place 2 sprays into both nostrils daily. 10/29/23  Becky Augusta, NP  ipratropium (ATROVENT) 0.06 % nasal spray Place 2 sprays into both nostrils 4 (four) times daily. 12/09/23   Becky Augusta, NP  promethazine-dextromethorphan (PROMETHAZINE-DM) 6.25-15 MG/5ML syrup Take 5 mLs by mouth 4 (four) times daily as needed. 12/09/23   Becky Augusta, NP  tranexamic acid (LYSTEDA) 650 MG TABS tablet Take 2 tablets (1,300 mg total) by mouth 3 (three) times daily. As needed. 09/30/23   Dale Goodyear, MD    Family History Family History  Problem Relation Age of Onset   Hypertension Father     Breast cancer Maternal Grandmother        41-70   Breast cancer Paternal Grandmother        66-70   Diabetes Other        parent   Colon cancer Neg Hx     Social History Social History   Tobacco Use   Smoking status: Never   Smokeless tobacco: Never  Vaping Use   Vaping status: Never Used  Substance Use Topics   Alcohol use: No    Alcohol/week: 0.0 standard drinks of alcohol   Drug use: No     Allergies   Sulfa antibiotics   Review of Systems Review of Systems  As noted in HPI Physical Exam Triage Vital Signs ED Triage Vitals  Encounter Vitals Group     BP 01/22/24 0816 119/76     Systolic BP Percentile --      Diastolic BP Percentile --      Pulse Rate 01/22/24 0816 92     Resp 01/22/24 0816 14     Temp 01/22/24 0816 98.5 F (36.9 C)     Temp Source 01/22/24 0816 Oral     SpO2 01/22/24 0816 100 %     Weight 01/22/24 0815 200 lb (90.7 kg)     Height 01/22/24 0815 5\' 7"  (1.702 m)     Head Circumference --      Peak Flow --      Pain Score 01/22/24 0815 0     Pain Loc --      Pain Education --      Exclude from Growth Chart --    No data found.  Updated Vital Signs BP 119/76 (BP Location: Left Arm)   Pulse 92   Temp 98.5 F (36.9 C) (Oral)   Resp 14   Ht 5\' 7"  (1.702 m)   Wt 200 lb (90.7 kg)   LMP 01/22/2024 (Exact Date)   SpO2 100%   BMI 31.32 kg/m   Visual Acuity Right Eye Distance:   Left Eye Distance:   Bilateral Distance:    Right Eye Near:   Left Eye Near:    Bilateral Near:     Physical Exam Physical Exam Vitals signs and nursing note reviewed.  Constitutional:      General: He is not in acute distress.    Appearance: He is well-developed and normal weight. He is not ill-appearing, toxic-appearing or diaphoretic.  HENT: TM's are clear Nose- normal Phaarynx- clear    Head: Normocephalic.  Eyes:     Extraocular Movements: Extraocular movements intact.     Pupils: Pupils are equal, round, and reactive to light.  Neck:      Musculoskeletal: Neck supple. No neck rigidity.   No carotid b ruits Cardiovascular:     Rate and Rhythm: Normal rate and regular rhythm.     Heart sounds: No murmur.  Pulmonary:     Effort: Pulmonary effort is  normal.     Breath sounds: Normal breath sounds. No wheezing, rhonchi or rales.   Musculoskeletal: Normal range of motion.  Lymphadenopathy:     Cervical: No cervical adenopathy.  Skin:    General: Skin is warm and dry.  Neurological:     Mental Status: He is alert.     Cranial Nerves: No cranial nerve deficit or facial asymmetry.     Sensory: No sensory deficit.     Motor: No weakness.     Coordination: Romberg sign negative. Coordination normal.     Gait: Gait normal.      Comments: Normal Romberg, finger to nose, tandem gait.   Psychiatric:        Mood and Affect: Mood normal.        Speech: Speech normal.        Behavior: Behavior normal.    UC Treatments / Results  Labs (all labs ordered are listed, but only abnormal results are displayed) Labs Reviewed - No data to display  EKG  NSR Normal  Radiology No results found.  Procedures Procedures (including critical care time)  Medications Ordered in UC Medications - No data to display  Initial Impression / Assessment and Plan / UC Course  I have reviewed the triage vital signs and the nursing notes.  Pertinent  imaging results that were available during my care of the patient were reviewed by me and considered in my medical decision making (see chart for details).  Dizziness, possible inner ear related  Pt was reassured her exam is normal today, and I will have her try Meclezine. Needs to keep apt with PCP for next week and do a FU if symptoms do not resolve.    Final Clinical Impressions(s) / UC Diagnoses   Final diagnoses:  Dizziness   Discharge Instructions   None    ED Prescriptions     Medication Sig Dispense Auth. Provider   meclizine (ANTIVERT) 25 MG tablet Take 1 tablet (25 mg  total) by mouth 3 (three) times daily as needed for dizziness. 30 tablet Rodriguez-Southworth, Nettie Elm, PA-C      PDMP not reviewed this encounter.   Garey Ham, [place] 01/22/24 1308

## 2024-01-27 ENCOUNTER — Other Ambulatory Visit: Payer: Managed Care, Other (non HMO)

## 2024-01-27 NOTE — Progress Notes (Signed)
 Subjective:    Patient ID: Erica Hernandez, female    DOB: Apr 10, 1976, 48 y.o.   MRN: [id number]  Patient here for  Chief Complaint  Patient presents with   Medical Management of Chronic Issues    HPI Here for a scheduled follow up - follow up regarding hypertension and anemia.  Seeing Dr Donneta Romberg for IDA - receiving IV venofer. Seen in ER 01/22/24 - dizziness, racing heart and mild sob. Felt to be possible inner ear and treated with meclizine. Reports was evaluated at [facility] as outlined above. Has noticed some intermittent increased heart rate. Episodes may last two minutes. Does report increased fatigue. Took meclizine - made her sleepy. Still some occasional dizziness. Some increased drainage. No increased headache currently.  No increased sinus pressure. Reports increased stress. Discussed.    Past Medical History:  Diagnosis Date   Hypertension    during pregnancy   Past Surgical History:  Procedure Laterality Date   COLONOSCOPY N/A 09/26/2022   Procedure: COLONOSCOPY;  Surgeon: Midge Minium, MD;  Location: [facility];  Service: Endoscopy;  Laterality: N/A;   ESOPHAGOGASTRODUODENOSCOPY N/A 09/26/2022   Procedure: ESOPHAGOGASTRODUODENOSCOPY (EGD);  Surgeon: Midge Minium, MD;  Location: [facility];  Service: Endoscopy;  Laterality: N/A;   GIVENS CAPSULE STUDY N/A 11/06/2022   Procedure: GIVENS CAPSULE STUDY;  Surgeon: Midge Minium, MD;  Location: [facility] END[facility]COPY;  Service: Endoscopy;  Laterality: N/A;   HERNIA REPAIR     SHOULDER SURGERY  03/30/11   TONSILLECTOMY  1984   Family History  Problem Relation Age of Onset   Hypertension Father    Breast cancer Maternal Grandmother        41-70   Breast cancer Paternal Grandmother        93-70   Diabetes Other        parent   Colon cancer Neg Hx    Social History   Socioeconomic History   Marital status: Married    Spouse name: Not on file   Number of children: 1   Years of education: Not on file   Highest  education level: 12th grade  Occupational History    Employer: [employer]  Tobacco Use   Smoking status: Never   Smokeless tobacco: Never  Vaping Use   Vaping status: Never Used  Substance and Sexual Activity   Alcohol use: No    Alcohol/week: 0.0 standard drinks of alcohol   Drug use: No   Sexual activity: Not on file  Other Topics Concern   Not on file  Social History Narrative   Lives in [place]; 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    Social Drivers of Corporate investment banker Strain: Low Risk  (01/25/2024)   Overall Financial Resource Strain (CARDIA)    Difficulty of Paying Living Expenses: Not hard at all  Food Insecurity: No Food Insecurity (01/25/2024)   Hunger Vital Sign    Worried About Running Out of Food in the Last Year: Never true    Ran Out of Food in the Last Year: Never true  Transportation Needs: No Transportation Needs (01/25/2024)   PRAPARE - Administrator, Civil Service (Medical): No    Lack of Transportation (Non-Medical): No  Physical Activity: Sufficiently Active (01/25/2024)   Exercise Vital Sign    Days of Exercise per Week: 5 days    Minutes of Exercise per Session: 30 min  Stress: No Stress Concern Present (01/25/2024)   [employer] of Occupational Health -  Occupational Stress Questionnaire    Feeling of Stress : Only a little  Social Connections: Moderately Integrated (01/25/2024)   Social Connection and Isolation Panel [NHANES]    Frequency of Communication with Friends and Family: More than three times a week    Frequency of Social Gatherings with Friends and Family: Twice a week    Attends Religious Services: 1 to 4 times per year    Active Member of [employer] or Organizations: No    Attends Engineer, structural: Not on file    Marital Status: Married     Review of Systems  Constitutional:  Positive for fatigue. Negative for appetite change, fever and unexpected weight change.  HENT:  Positive for  postnasal drip. Negative for sinus pressure.   Respiratory:  Negative for cough, chest tightness and shortness of breath.   Cardiovascular:  Negative for chest pain, palpitations and leg swelling.  Gastrointestinal:  Negative for abdominal pain, diarrhea, nausea and vomiting.  Genitourinary:  Negative for difficulty urinating and dysuria.  Musculoskeletal:  Negative for joint swelling and myalgias.  Skin:  Negative for color change and rash.  Neurological:  Positive for dizziness and light-headedness.       Some headache - no increased headache currently.   Psychiatric/Behavioral:  Negative for agitation and dysphoric mood.        Objective:     BP 118/70   Pulse 88   Temp 98 F (36.7 C)   Resp 16   Ht 5\' 7"  (1.702 m)   Wt 206 lb 3.2 oz (93.5 kg)   LMP 01/22/2024 (Exact Date)   SpO2 99%   BMI 32.30 kg/m  Wt Readings from Last 3 Encounters:  01/29/24 207 lb 12.8 oz (94.3 kg)  01/28/24 206 lb 3.2 oz (93.5 kg)  01/22/24 200 lb (90.7 kg)   Blood pressure 118/78 lying down and 114/74 standing.  Physical Exam Vitals reviewed.  Constitutional:      General: She is not in acute distress.    Appearance: Normal appearance.  HENT:     Head: Normocephalic and atraumatic.     Right Ear: External ear normal.     Left Ear: External ear normal.     Mouth/Throat:     Pharynx: No oropharyngeal exudate or posterior oropharyngeal erythema.  Eyes:     General: No scleral icterus.       Right eye: No discharge.        Left eye: No discharge.     Conjunctiva/sclera: Conjunctivae normal.  Neck:     Thyroid: No thyromegaly.  Cardiovascular:     Rate and Rhythm: Normal rate and regular rhythm.  Pulmonary:     Effort: No respiratory distress.     Breath sounds: Normal breath sounds. No wheezing.  Abdominal:     General: Bowel sounds are normal.     Palpations: Abdomen is soft.     Tenderness: There is no abdominal tenderness.  Musculoskeletal:        General: No swelling or  tenderness.     Cervical back: Neck supple. No tenderness.  Lymphadenopathy:     Cervical: No cervical adenopathy.  Skin:    Findings: No erythema or rash.  Neurological:     Mental Status: She is alert.  Psychiatric:        Mood and Affect: Mood normal.        Behavior: Behavior normal.         Outpatient Encounter Medications as of 01/28/2024  Medication  Sig   topiramate (TOPAMAX) 50 MG tablet Take 50 mg by mouth daily.   Calcium Carbonate-Vitamin D3 600-400 MG-UNIT TABS Take 1 mg by mouth daily.   cetirizine (ZYRTEC) 10 MG tablet Take by mouth.   [employer] FIBER GUMMIES PO Take by mouth.   ferrous sulfate 325 (65 FE) MG tablet Take 325 mg by mouth daily with breakfast.   fluticasone (FLONASE) 50 MCG/ACT nasal spray Place 2 sprays into both nostrils daily.   ipratropium (ATROVENT) 0.06 % nasal spray Place 2 sprays into both nostrils 4 (four) times daily. (Patient taking differently: Place 2 sprays into both nostrils daily.)   meclizine (ANTIVERT) 25 MG tablet Take 1 tablet (25 mg total) by mouth 3 (three) times daily as needed for dizziness.   phentermine 37.5 MG capsule Take 37.5 mg by mouth every morning.   tranexamic acid (LYSTEDA) 650 MG TABS tablet Take 2 tablets (1,300 mg total) by mouth 3 (three) times daily. As needed.   [DISCONTINUED] promethazine-dextromethorphan (PROMETHAZINE-DM) 6.25-15 MG/5ML syrup Take 5 mLs by mouth 4 (four) times daily as needed.   [DISCONTINUED] topiramate (TOPAMAX) 25 MG tablet Take 25 mg by mouth daily.   [DISCONTINUED] tranexamic acid (LYSTEDA) 650 MG TABS tablet Take 2 tablets (1,300 mg total) by mouth 3 (three) times daily. As needed.   No facility-administered encounter medications on file as of 01/28/2024.     Lab Results  Component Value Date   WBC 4.2 01/28/2024   HGB 12.9 01/28/2024   HCT 39.0 01/28/2024   PLT 224.0 01/28/2024   GLUCOSE 90 01/28/2024   CHOL 123 01/28/2024   TRIG 55.0 01/28/2024   HDL 52.60 01/28/2024   LDLCALC 59  01/28/2024   ALT 24 01/28/2024   AST 19 01/28/2024   NA 140 01/28/2024   K 4.3 01/28/2024   CL 108 01/28/2024   CREATININE 0.80 01/28/2024   BUN 13 01/28/2024   CO2 26 01/28/2024   TSH 1.54 01/28/2024   HGBA1C 5.0 03/03/2022       Assessment & Plan:  Dizziness Assessment & Plan: Recently evaluated at UC as outlined. Not orthostatic on exam and not reproducible on exam. Increased drainage. Steroid nasal spray as directed. Discussed further w/up. Notify if persistent.   Orders: -     TSH  Hypertension, essential Assessment & Plan: Blood pressure as outlined. Doing well. Not orthostatic on exam. On no medication. Follow.   Orders: -     Lipid panel -     Basic metabolic panel -     TSH  Abnormal liver function test -     Hepatic function panel  Iron deficiency anemia, unspecified iron deficiency anemia type Assessment & Plan: History of bariatric surgery.   Seeing Dr. Brahmanday/hematology.  Has received IV iron.  Follow cbc and iron studies. Has had GI w/up. Reports increased fatigue - persistent.  Request check cbc and iron studies today.   Orders: -     CBC with Differential/Platelet -     IBC + Ferritin  Menstrual irregularity -     Follicle stimulating hormone  Stress Assessment & Plan: Increased stress. Discussed. Notify if feels needs any further intervention.    Other fatigue Assessment & Plan: Discussed.  Reports increased fatigue.  Being followed by hematology.  Has received IV iron infusions.  Request cbc and iron studies to be checked today.     Tachycardia Assessment & Plan: Intermittent increased heart rate. Had EKG UC. Discussed further w/up - including zio monitor. Wants to  check labs. Treat as outlined. Follow.  Call if persistent.    Other orders -     Tranexamic Acid; Take 2 tablets (1,300 mg total) by mouth 3 (three) times daily. As needed.  Dispense: 30 tablet; Refill: 0     Dale Yorklyn, MD

## 2024-01-28 ENCOUNTER — Ambulatory Visit: Payer: Managed Care, Other (non HMO) | Admitting: Internal Medicine

## 2024-01-28 VITALS — BP 118/70 | HR 88 | Temp 98.0°F | Resp 16 | Ht 67.0 in | Wt 206.2 lb

## 2024-01-28 DIAGNOSIS — N926 Irregular menstruation, unspecified: Secondary | ICD-10-CM | POA: Diagnosis not present

## 2024-01-28 DIAGNOSIS — R42 Dizziness and giddiness: Secondary | ICD-10-CM

## 2024-01-28 DIAGNOSIS — I10 Essential (primary) hypertension: Secondary | ICD-10-CM

## 2024-01-28 DIAGNOSIS — R000 Tachycardia, unspecified: Secondary | ICD-10-CM

## 2024-01-28 DIAGNOSIS — D509 Iron deficiency anemia, unspecified: Secondary | ICD-10-CM | POA: Diagnosis not present

## 2024-01-28 DIAGNOSIS — R7989 Other specified abnormal findings of blood chemistry: Secondary | ICD-10-CM

## 2024-01-28 DIAGNOSIS — F439 Reaction to severe stress, unspecified: Secondary | ICD-10-CM

## 2024-01-28 DIAGNOSIS — R5383 Other fatigue: Secondary | ICD-10-CM

## 2024-01-28 LAB — BASIC METABOLIC PANEL
BUN: 13 mg/dL (ref 6–23)
CO2: 26 meq/L (ref 19–32)
Calcium: 9.2 mg/dL (ref 8.4–10.5)
Chloride: 108 meq/L (ref 96–112)
Creatinine, Ser: 0.8 mg/dL (ref 0.40–1.20)
GFR: 88.77 mL/min (ref >60.00)
Glucose, Bld: 90 mg/dL (ref 70–99)
Potassium: 4.3 meq/L (ref 3.5–5.1)
Sodium: 140 meq/L (ref 135–145)

## 2024-01-28 LAB — CBC WITH DIFFERENTIAL/PLATELET
Basophils Absolute: 0 10*3/uL (ref 0.0–0.1)
Basophils Relative: 0.6 % (ref 0.0–3.0)
Eosinophils Absolute: 0.1 10*3/uL (ref 0.0–0.7)
Eosinophils Relative: 2.3 % (ref 0.0–5.0)
HCT: 39 % (ref 36.0–46.0)
Hemoglobin: 12.9 g/dL (ref 12.0–15.0)
Lymphocytes Relative: 37.5 % (ref 12.0–46.0)
Lymphs Abs: 1.6 10*3/uL (ref 0.7–4.0)
MCHC: 33.1 g/dL (ref 30.0–36.0)
MCV: 93.4 fl (ref 78.0–100.0)
Monocytes Absolute: 0.3 10*3/uL (ref 0.1–1.0)
Monocytes Relative: 8 % (ref 3.0–12.0)
Neutro Abs: 2.1 10*3/uL (ref 1.4–7.7)
Neutrophils Relative %: 51.6 % (ref 43.0–77.0)
Platelets: 224 10*3/uL (ref 150.0–400.0)
RBC: 4.17 Mil/uL (ref 3.87–5.11)
RDW: 12.9 % (ref 11.5–15.5)
WBC: 4.2 10*3/uL (ref 4.0–10.5)

## 2024-01-28 LAB — HEPATIC FUNCTION PANEL
ALT: 24 U/L (ref 0–35)
AST: 19 U/L (ref 0–37)
Albumin: 4.2 g/dL (ref 3.5–5.2)
Alkaline Phosphatase: 92 U/L (ref 39–117)
Bilirubin, Direct: 0.1 mg/dL (ref 0.0–0.3)
Total Bilirubin: 0.4 mg/dL (ref 0.2–1.2)
Total Protein: 6.5 g/dL (ref 6.0–8.3)

## 2024-01-28 LAB — LIPID PANEL
Cholesterol: 123 mg/dL (ref 0–200)
HDL: 52.6 mg/dL (ref >39.00)
LDL Cholesterol: 59 mg/dL (ref 0–99)
NonHDL: 70.48
Total CHOL/HDL Ratio: 2
Triglycerides: 55 mg/dL (ref 0.0–149.0)
VLDL: 11 mg/dL (ref 0.0–40.0)

## 2024-01-28 LAB — IBC + FERRITIN
Ferritin: 13.2 ng/mL (ref 10.0–291.0)
Iron: 91 ug/dL (ref 42–145)
Saturation Ratios: 27.7 % (ref 20.0–50.0)
TIBC: 329 ug/dL (ref 250.0–450.0)
Transferrin: 235 mg/dL (ref 212.0–360.0)

## 2024-01-28 LAB — TSH: TSH: 1.54 u[IU]/mL (ref 0.35–5.50)

## 2024-01-28 LAB — FOLLICLE STIMULATING HORMONE: FSH: 16.6 m[IU]/mL

## 2024-01-28 MED ORDER — TRANEXAMIC ACID 650 MG PO TABS: 1300.0000 mg | ORAL_TABLET | Freq: Three times a day (TID) | ORAL | 0 refills | Status: DC

## 2024-01-28 NOTE — Patient Instructions (Signed)
 Nasacort nasal spray - 2 sprays each nostril one time per day.   Continue saline flushes.

## 2024-01-29 ENCOUNTER — Encounter: Payer: Self-pay | Admitting: Internal Medicine

## 2024-01-29 ENCOUNTER — Inpatient Hospital Stay: Payer: Managed Care, Other (non HMO) | Attending: Internal Medicine | Admitting: Internal Medicine

## 2024-01-29 ENCOUNTER — Inpatient Hospital Stay: Payer: Managed Care, Other (non HMO)

## 2024-01-29 VITALS — BP 119/85 | HR 80

## 2024-01-29 VITALS — BP 103/67 | HR 79 | Temp 98.4°F | Resp 20 | Ht 67.0 in | Wt 207.8 lb

## 2024-01-29 DIAGNOSIS — E611 Iron deficiency: Secondary | ICD-10-CM | POA: Diagnosis not present

## 2024-01-29 DIAGNOSIS — D509 Iron deficiency anemia, unspecified: Secondary | ICD-10-CM

## 2024-01-29 DIAGNOSIS — D508 Other iron deficiency anemias: Secondary | ICD-10-CM | POA: Insufficient documentation

## 2024-01-29 DIAGNOSIS — K909 Intestinal malabsorption, unspecified: Secondary | ICD-10-CM | POA: Diagnosis present

## 2024-01-29 MED ORDER — SODIUM CHLORIDE 0.9% FLUSH
10.0000 mL | Freq: Once | INTRAVENOUS | Status: AC | PRN
  Administered 2024-01-29: 10 mL
  Filled 2024-01-29: qty 10

## 2024-01-29 MED ORDER — IRON SUCROSE 20 MG/ML IV SOLN
200.0000 mg | Freq: Once | INTRAVENOUS | Status: AC
  Administered 2024-01-29: 200 mg via INTRAVENOUS
  Filled 2024-01-29: qty 10

## 2024-01-29 NOTE — Assessment & Plan Note (Addendum)
#  Iron deficiency anemia-multifactorial/malabsorption-bariatric surgery; heavy menstrual cycles. S/p IV iron. On Bariatric advantage MVT.   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   # DISPOSITION: # venofer today-  # in 1 week venofer  # follow up in 6 months- MD; 2-3 days prior- labs- cbc/bmp;ironstudies/ferritin; B12 levels- possible venofer- Dr.B

## 2024-01-29 NOTE — Progress Notes (Signed)
 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  Also capsule study [last as per patient].   #Chronic fatigue- Dx: OSA; intolerant to CPAP; 2022-sleep study negative.    HISTORY OF PRESENTING ILLNESS: Alone.  Ambulating independently.  Erica Hernandez 48 y.o.  female with iron deficiency sec to malabsorption/duodenal switch/heavy menstrual cycles is here for follow-up.  Complains of ongoing fatigue.  No nausea vomiting.  Patient denies any blood in stools or black-colored stools.  Patient denies any nausea vomiting abdominal pain.  Review of Systems  Constitutional:  Positive for malaise/fatigue and weight loss (Intentional/gastric bypass). Negative for chills, diaphoresis and fever.  HENT:  Negative for nosebleeds and sore throat.   Eyes:  Negative for double vision.  Respiratory:  Negative for cough, hemoptysis, sputum production, shortness of breath and wheezing.   Cardiovascular:  Negative for chest pain, palpitations, orthopnea and leg swelling.  Gastrointestinal:  Negative for abdominal pain, blood in stool, constipation, diarrhea, heartburn, melena, nausea and vomiting.  Genitourinary:  Negative for dysuria, frequency and urgency.  Musculoskeletal:  Negative for back pain and joint pain.  Skin: Negative.  Negative for itching and rash.  Neurological:  Negative for dizziness, tingling, focal weakness, weakness and headaches.  Endo/Heme/Allergies:  Does not bruise/bleed easily.  Psychiatric/Behavioral:  Negative for  depression. The patient is not nervous/anxious and does not have insomnia.     MEDICAL HISTORY:  Past Medical History:  Diagnosis Date   Hypertension    during pregnancy    SURGICAL HISTORY: Past Surgical History:  Procedure Laterality Date   COLONOSCOPY N/A 09/26/2022   Procedure: COLONOSCOPY;  Surgeon: Midge Minium, MD;  Location: [facility];  Service: Endoscopy;  Laterality: N/A;   ESOPHAGOGASTRODUODENOSCOPY N/A 09/26/2022   Procedure: ESOPHAGOGASTRODUODENOSCOPY (EGD);  Surgeon: Midge Minium, MD;  Location: [facility];  Service: Endoscopy;  Laterality: N/A;   GIVENS CAPSULE STUDY N/A 11/06/2022   Procedure: GIVENS CAPSULE STUDY;  Surgeon: Midge Minium, MD;  Location: [facility];  Service: Endoscopy;  Laterality: N/A;   HERNIA REPAIR     SHOULDER SURGERY  03/30/11   TONSILLECTOMY  1984    SOCIAL HISTORY: Social History   Socioeconomic History   Marital status: Married    Spouse name: Not on file   Number of children: 1   Years of education: Not on file   Highest education level: 12th grade  Occupational History    Employer: [employer]  Tobacco Use   Smoking status: Never   Smokeless tobacco: Never  Vaping Use   Vaping status: Never Used  Substance and Sexual Activity   Alcohol use: No    Alcohol/week: 0.0 standard drinks of alcohol   Drug use: No   Sexual activity: Not on file  Other Topics Concern   Not on file  Social History Narrative   Lives in [place]; 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    Social Drivers of Corporate investment banker Strain: Low Risk  (01/25/2024)   Overall Financial Resource Strain (CARDIA)    Difficulty of Paying Living Expenses: Not hard at all  Food Insecurity: No Food Insecurity (01/25/2024)   Hunger Vital Sign    Worried About Running Out of Food in the Last Year: Never true    Ran Out of Food in the Last Year: Never true  Transportation Needs: No Transportation Needs (01/25/2024)   PRAPARE -  Administrator, Civil Service (Medical): No    Lack of Transportation (Non-Medical): No  Physical Activity: Sufficiently Active (01/25/2024)   Exercise Vital Sign    Days of Exercise per Week: 5 days    Minutes of Exercise per Session: 30 min  Stress: No Stress Concern Present (01/25/2024)   [employer] of Occupational Health - Occupational Stress Questionnaire    Feeling of Stress : Only a little  Social Connections: Moderately Integrated (01/25/2024)   Social Connection and Isolation Panel [NHANES]    Frequency of Communication with Friends and Family: More than three times a week    Frequency of Social Gatherings with Friends and Family: Twice a week    Attends Religious Services: 1 to 4 times per year    Active Member of [employer] or Organizations: No    Attends Engineer, structural: Not on file    Marital Status: Married  Catering manager Violence: Not on file    FAMILY HISTORY: Family History  Problem Relation Age of Onset   Hypertension Father    Breast cancer Maternal Grandmother        60-70   Breast cancer Paternal Grandmother        61-70   Diabetes Other        parent   Colon cancer Neg Hx     ALLERGIES:  is allergic to sulfa antibiotics.  MEDICATIONS:  Current Outpatient Medications  Medication Sig Dispense Refill   Calcium Carbonate-Vitamin D3 600-400 MG-UNIT TABS Take 1 mg by mouth daily.     cetirizine (ZYRTEC) 10 MG tablet Take by mouth.     [employer] FIBER GUMMIES PO Take by mouth.     ferrous sulfate 325 (65 FE) MG tablet Take 325 mg by mouth daily with breakfast.     fluticasone (FLONASE) 50 MCG/ACT nasal spray Place 2 sprays into both nostrils daily. 18.2 mL 1   ipratropium (ATROVENT) 0.06 % nasal spray Place 2 sprays into both nostrils 4 (four) times daily. (Patient taking differently: Place 2 sprays into both nostrils daily.) 15 mL 12   meclizine (ANTIVERT) 25 MG tablet Take 1 tablet (25 mg total) by mouth 3 (three) times daily as  needed for dizziness. 30 tablet 0   phentermine 37.5 MG capsule Take 37.5 mg by mouth every morning.     topiramate (TOPAMAX) 50 MG tablet Take 50 mg by mouth daily.     tranexamic acid (LYSTEDA) 650 MG TABS tablet Take 2 tablets (1,300 mg total) by mouth 3 (three) times daily. As needed. 30 tablet 0   No current facility-administered medications for this visit.   Facility-Administered Medications Ordered in Other Visits  Medication Dose Route Frequency Provider Last Rate Last Admin   iron sucrose (VENOFER) injection 200 mg  200 mg Intravenous Once Louretta Shorten R, MD       sodium chloride flush (NS) 0.9 % injection 10 mL  10 mL Intracatheter Once PRN Earna Coder, MD          PHYSICAL EXAMINATION:   Vitals:   01/29/24 1257  BP: 103/67  Pulse: 79  Resp: 20  Temp: 98.4 F (36.9 C)  SpO2: 100%  Filed Weights   01/29/24 1257  Weight: 207 lb 12.8 oz (94.3 kg)    Physical Exam HENT:     Head: Normocephalic and atraumatic.     Mouth/Throat:     Pharynx: No oropharyngeal exudate.  Eyes:     Pupils: Pupils are equal, round, and reactive to light.  Cardiovascular:     Rate and Rhythm: Normal rate and regular rhythm.  Pulmonary:     Effort: Pulmonary effort is normal. No respiratory distress.     Breath sounds: Normal breath sounds. No wheezing.  Abdominal:     General: Bowel sounds are normal. There is no distension.     Palpations: Abdomen is soft. There is no mass.     Tenderness: There is no abdominal tenderness. There is no guarding or rebound.  Musculoskeletal:        General: No tenderness. Normal range of motion.     Cervical back: Normal range of motion and neck supple.  Skin:    General: Skin is warm.  Neurological:     Mental Status: She is alert and oriented to person, place, and time.  Psychiatric:        Mood and Affect: Affect normal.     LABORATORY DATA:  I have reviewed the data as listed Lab Results  Component Value Date   WBC  4.2 01/28/2024   HGB 12.9 01/28/2024   HCT 39.0 01/28/2024   MCV 93.4 01/28/2024   PLT 224.0 01/28/2024   Recent Labs    03/16/23 1112 05/27/23 0825 06/22/23 0749 07/29/23 1301 09/30/23 0823 01/28/24 0953  NA 137   < >  --  136 138 140  K 4.2   < >  --  3.8 4.0 4.3  CL 107   < >  --  105 107 108  CO2 24   < >  --  26 26 26   GLUCOSE 95   < >  --  94 83 90  BUN 22*   < >  --  21* 19 13  CREATININE 0.84   < >  --  0.75 0.75 0.80  CALCIUM 8.5*   < >  --  9.3 9.0 9.2  GFRNONAA >60  --   --  >60  --   --   PROT 6.6   < > 6.3  --  6.3 6.5  ALBUMIN 3.9   < > 3.9  --  4.2 4.2  AST 23   < > 28  --  17 19  ALT 29   < > 43*  --  15 24  ALKPHOS 63   < > 82  --  79 92  BILITOT 0.4   < > 0.4  --  0.5 0.4  BILIDIR  --    < > 0.1  --  0.1 0.1   < > = values in this interval not displayed.    No results found.  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 S/p IV iron. On Bariatric advantage MVT.   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   # DISPOSITION: # venofer today-  # in 1 week venofer  # follow up in 6 months- MD; 2-3 days prior- labs- cbc/bmp;ironstudies/ferritin; B12 levels- possible venofer- Dr.B  All questions were  answered. The patient knows to call the clinic with any problems, questions or concerns.   Earna Coder, MD 01/29/2024 1:29 PM

## 2024-01-29 NOTE — Progress Notes (Signed)
 Fatigue/weakness: YES Dyspena: NO  Light headedness: YES Blood in stool: NO

## 2024-01-30 ENCOUNTER — Encounter: Payer: Self-pay | Admitting: Internal Medicine

## 2024-01-30 DIAGNOSIS — R000 Tachycardia, unspecified: Secondary | ICD-10-CM | POA: Insufficient documentation

## 2024-01-30 NOTE — Assessment & Plan Note (Signed)
 Discussed.  Reports increased fatigue.  Being followed by hematology.  Has received IV iron infusions.  Request cbc and iron studies to be checked today.

## 2024-01-30 NOTE — Assessment & Plan Note (Signed)
 Increased stress. Discussed. Notify if feels needs any further intervention.

## 2024-01-30 NOTE — Assessment & Plan Note (Signed)
 Intermittent increased heart rate. Had EKG UC. Discussed further w/up - including zio monitor. Wants to check labs. Treat as outlined. Follow.  Call if persistent.

## 2024-01-30 NOTE — Assessment & Plan Note (Signed)
 Recently evaluated at [facility] as outlined. Not orthostatic on exam and not reproducible on exam. Increased drainage. Steroid nasal spray as directed. Discussed further w/up. Notify if persistent.

## 2024-01-30 NOTE — Assessment & Plan Note (Signed)
 Blood pressure as outlined. Doing well. Not orthostatic on exam. On no medication. Follow.

## 2024-01-30 NOTE — Assessment & Plan Note (Signed)
 History of bariatric surgery.   Seeing Dr. Brahmanday/hematology.  Has received IV iron.  Follow cbc and iron studies. Has had GI w/up. Reports increased fatigue - persistent.  Request check cbc and iron studies today.

## 2024-02-05 ENCOUNTER — Other Ambulatory Visit: Payer: Self-pay | Admitting: Internal Medicine

## 2024-02-05 ENCOUNTER — Inpatient Hospital Stay

## 2024-02-05 VITALS — BP 116/81 | HR 81 | Temp 98.1°F | Resp 16

## 2024-02-05 DIAGNOSIS — E611 Iron deficiency: Secondary | ICD-10-CM

## 2024-02-05 DIAGNOSIS — D509 Iron deficiency anemia, unspecified: Secondary | ICD-10-CM

## 2024-02-05 DIAGNOSIS — R42 Dizziness and giddiness: Secondary | ICD-10-CM

## 2024-02-05 DIAGNOSIS — D508 Other iron deficiency anemias: Secondary | ICD-10-CM | POA: Diagnosis not present

## 2024-02-05 MED ORDER — IRON SUCROSE 20 MG/ML IV SOLN
200.0000 mg | Freq: Once | INTRAVENOUS | Status: AC
Start: 2024-02-05 — End: 2024-02-05
  Administered 2024-02-05: 200 mg via INTRAVENOUS
  Filled 2024-02-05: qty 10

## 2024-02-05 NOTE — Progress Notes (Signed)
Order placed for referral to ENT 

## 2024-02-05 NOTE — Patient Instructions (Signed)

## 2024-02-28 NOTE — Progress Notes (Unsigned)
 Subjective:    Patient ID: Erica Hernandez, female    DOB: [birth year], 48 y.o.   MRN: [id number]  Patient here for No chief complaint on file.   HPI Here for a scheduled follow up - follow up regarding hypertension and anemia.  Seeing Dr Valentine Gasmen for IDA - receiving IV venofer . Last visit, reported increased stress and dizziness/tachycardia.    Past Medical History:  Diagnosis Date   Hypertension    during pregnancy   Past Surgical History:  Procedure Laterality Date   COLONOSCOPY N/A 09/26/2022   Procedure: COLONOSCOPY;  Surgeon: Marnee Sink, MD;  Location: [facility];  Service: Endoscopy;  Laterality: N/A;   ESOPHAGOGASTRODUODENOSCOPY N/A 09/26/2022   Procedure: ESOPHAGOGASTRODUODENOSCOPY (EGD);  Surgeon: Marnee Sink, MD;  Location: [facility];  Service: Endoscopy;  Laterality: N/A;   GIVENS CAPSULE STUDY N/A 11/06/2022   Procedure: GIVENS CAPSULE STUDY;  Surgeon: Marnee Sink, MD;  Location: [facility];  Service: Endoscopy;  Laterality: N/A;   HERNIA REPAIR     SHOULDER SURGERY  03/30/11   TONSILLECTOMY  1984   Family History  Problem Relation Age of Onset   Hypertension Father    Breast cancer Maternal Grandmother        33-70   Breast cancer Paternal Grandmother        56-70   Diabetes Other        parent   Colon cancer Neg Hx    Social History   Socioeconomic History   Marital status: Married    Spouse name: Not on file   Number of children: 1   Years of education: Not on file   Highest education level: 12th grade  Occupational History    Employer: [employer]  Tobacco Use   Smoking status: Never   Smokeless tobacco: Never  Vaping Use   Vaping status: Never Used  Substance and Sexual Activity   Alcohol use: No    Alcohol/week: 0.0 standard drinks of alcohol   Drug use: No   Sexual activity: Not on file  Other Topics Concern   Not on file  Social History Narrative   Lives in [place]; Diplomatic Services operational officer- garage; never smoked; rare  alcohol.    Social Drivers of Corporate investment banker Strain: Low Risk  (01/25/2024)   Overall Financial Resource Strain (CARDIA)    Difficulty of Paying Living Expenses: Not hard at all  Food Insecurity: No Food Insecurity (01/25/2024)   Hunger Vital Sign    Worried About Running Out of Food in the Last Year: Never true    Ran Out of Food in the Last Year: Never true  Transportation Needs: No Transportation Needs (01/25/2024)   PRAPARE - Administrator, Civil Service (Medical): No    Lack of Transportation (Non-Medical): No  Physical Activity: Sufficiently Active (01/25/2024)   Exercise Vital Sign    Days of Exercise per Week: 5 days    Minutes of Exercise per Session: 30 min  Stress: No Stress Concern Present (01/25/2024)   [employer] of Occupational Health - Occupational Stress Questionnaire    Feeling of Stress : Only a little  Social Connections: Moderately Integrated (01/25/2024)   Social Connection and Isolation Panel [NHANES]    Frequency of Communication with Friends and Family: More than three times a week    Frequency of Social Gatherings with Friends and Family: Twice a week    Attends Religious Services: 1 to 4 times per year    Active Member  of Clubs or Organizations: No    Attends Banker Meetings: Not on file    Marital Status: Married     Review of Systems     Objective:     There were no vitals taken for this visit. Wt Readings from Last 3 Encounters:  01/29/24 207 lb 12.8 oz (94.3 kg)  01/28/24 206 lb 3.2 oz (93.5 kg)  01/22/24 200 lb (90.7 kg)    Physical Exam  {Perform Simple Foot Exam  Perform Detailed exam:1} {Insert foot Exam (Optional):30965}   Outpatient Encounter Medications as of 02/29/2024  Medication Sig   Calcium  Carbonate-Vitamin D3 600-400 MG-UNIT TABS Take 1 mg by mouth daily.   cetirizine (ZYRTEC) 10 MG tablet Take by mouth.   [employer] FIBER GUMMIES PO Take by mouth.   ferrous sulfate 325 (65 FE) MG  tablet Take 325 mg by mouth daily with breakfast.   fluticasone  (FLONASE ) 50 MCG/ACT nasal spray Place 2 sprays into both nostrils daily.   ipratropium (ATROVENT ) 0.06 % nasal spray Place 2 sprays into both nostrils 4 (four) times daily. (Patient taking differently: Place 2 sprays into both nostrils daily.)   meclizine  (ANTIVERT ) 25 MG tablet Take 1 tablet (25 mg total) by mouth 3 (three) times daily as needed for dizziness.   phentermine 37.5 MG capsule Take 37.5 mg by mouth every morning.   topiramate (TOPAMAX) 50 MG tablet Take 50 mg by mouth daily.   tranexamic acid  (LYSTEDA ) 650 MG TABS tablet Take 2 tablets (1,300 mg total) by mouth 3 (three) times daily. As needed.   No facility-administered encounter medications on file as of 02/29/2024.     Lab Results  Component Value Date   WBC 4.2 01/28/2024   HGB 12.9 01/28/2024   HCT 39.0 01/28/2024   PLT 224.0 01/28/2024   GLUCOSE 90 01/28/2024   CHOL 123 01/28/2024   TRIG 55.0 01/28/2024   HDL 52.60 01/28/2024   LDLCALC 59 01/28/2024   ALT 24 01/28/2024   AST 19 01/28/2024   NA 140 01/28/2024   K 4.3 01/28/2024   CL 108 01/28/2024   CREATININE 0.80 01/28/2024   BUN 13 01/28/2024   CO2 26 01/28/2024   TSH 1.54 01/28/2024   HGBA1C 5.0 03/03/2022    No results found.     Assessment & Plan:  Hypertension, essential  Abnormal liver function test     Dellar Fenton, MD

## 2024-02-29 ENCOUNTER — Ambulatory Visit: Attending: Internal Medicine

## 2024-02-29 ENCOUNTER — Encounter: Payer: Self-pay | Admitting: Internal Medicine

## 2024-02-29 ENCOUNTER — Ambulatory Visit: Admitting: Internal Medicine

## 2024-02-29 VITALS — BP 104/70 | HR 74 | Temp 97.9°F | Resp 16 | Ht 67.0 in | Wt 204.6 lb

## 2024-02-29 DIAGNOSIS — D509 Iron deficiency anemia, unspecified: Secondary | ICD-10-CM | POA: Diagnosis not present

## 2024-02-29 DIAGNOSIS — I10 Essential (primary) hypertension: Secondary | ICD-10-CM

## 2024-02-29 DIAGNOSIS — R7989 Other specified abnormal findings of blood chemistry: Secondary | ICD-10-CM

## 2024-02-29 DIAGNOSIS — R42 Dizziness and giddiness: Secondary | ICD-10-CM

## 2024-02-29 DIAGNOSIS — R000 Tachycardia, unspecified: Secondary | ICD-10-CM

## 2024-02-29 NOTE — Assessment & Plan Note (Signed)
 Persistent intermittent episodes as outlined. Saw ENT. Records requested. Recommended steroid nasal spray daily. F/u one month. If persistent, will plan scan. Not orthostatic on exam. No change in symptoms. Agreeable to zio monitor.

## 2024-02-29 NOTE — Assessment & Plan Note (Signed)
 Has lost weight previously.  Weight is stable.  Continue diet and exercise.  Recheck liver panel. Hepatitis C antibody was negative back in 2022.  She also had an ultrasound that did not show any abnormalities or increased echogenicity of the liver (03/11/22).  She also had her gallbladder removed at the time of the bariatric surgery.  No report of any Advil Aleve Motrin BCs or Goody powder use. No history of alcohol abuse or high risk activities such as IV drug use, homemade tattoos or high risk sexual activity with high risk individuals. Has seen GI previously.  GGT wnl.  Liver panel 01/28/24 - wnl.

## 2024-02-29 NOTE — Assessment & Plan Note (Signed)
 History of bariatric surgery.   Seeing Dr. Brahmanday/hematology.  Has received IV iron .  Follow cbc and iron  studies. Just had GI w/up as outlined.  Hematology following cbc and iron  studies.

## 2024-02-29 NOTE — Assessment & Plan Note (Signed)
 Blood pressure as outlined. Not orthostatic on exam. On no medication. Follow.

## 2024-02-29 NOTE — Assessment & Plan Note (Signed)
 Intermittent increased heart rate. Had EKG UC. Discussed further w/up - including zio monitor. She is agreeable for zio now. Follow.  Monitor for triggers.

## 2024-03-27 DIAGNOSIS — R000 Tachycardia, unspecified: Secondary | ICD-10-CM

## 2024-03-28 ENCOUNTER — Ambulatory Visit: Payer: Self-pay | Admitting: Internal Medicine

## 2024-05-10 ENCOUNTER — Other Ambulatory Visit: Payer: Self-pay | Admitting: Internal Medicine

## 2024-05-16 ENCOUNTER — Encounter: Payer: Self-pay | Admitting: Internal Medicine

## 2024-05-16 ENCOUNTER — Ambulatory Visit: Admitting: Internal Medicine

## 2024-05-16 VITALS — BP 122/78 | HR 77 | Ht 67.0 in | Wt 201.0 lb

## 2024-05-16 DIAGNOSIS — R7989 Other specified abnormal findings of blood chemistry: Secondary | ICD-10-CM | POA: Diagnosis not present

## 2024-05-16 DIAGNOSIS — F439 Reaction to severe stress, unspecified: Secondary | ICD-10-CM

## 2024-05-16 DIAGNOSIS — Z0000 Encounter for general adult medical examination without abnormal findings: Secondary | ICD-10-CM | POA: Diagnosis not present

## 2024-05-16 DIAGNOSIS — D509 Iron deficiency anemia, unspecified: Secondary | ICD-10-CM

## 2024-05-16 DIAGNOSIS — R000 Tachycardia, unspecified: Secondary | ICD-10-CM

## 2024-05-16 DIAGNOSIS — K219 Gastro-esophageal reflux disease without esophagitis: Secondary | ICD-10-CM

## 2024-05-16 DIAGNOSIS — I10 Essential (primary) hypertension: Secondary | ICD-10-CM | POA: Diagnosis not present

## 2024-05-16 DIAGNOSIS — R42 Dizziness and giddiness: Secondary | ICD-10-CM

## 2024-05-16 LAB — CBC WITH DIFFERENTIAL/PLATELET
Basophils Absolute: 0 K/uL (ref 0.0–0.1)
Basophils Relative: 0.4 % (ref 0.0–3.0)
Eosinophils Absolute: 0.1 K/uL (ref 0.0–0.7)
Eosinophils Relative: 1.6 % (ref 0.0–5.0)
HCT: 37.4 % (ref 36.0–46.0)
Hemoglobin: 12.4 g/dL (ref 12.0–15.0)
Lymphocytes Relative: 37.4 % (ref 12.0–46.0)
Lymphs Abs: 1.4 K/uL (ref 0.7–4.0)
MCHC: 33.2 g/dL (ref 30.0–36.0)
MCV: 92.3 fl (ref 78.0–100.0)
Monocytes Absolute: 0.2 K/uL (ref 0.1–1.0)
Monocytes Relative: 6.1 % (ref 3.0–12.0)
Neutro Abs: 2 K/uL (ref 1.4–7.7)
Neutrophils Relative %: 54.5 % (ref 43.0–77.0)
Platelets: 219 K/uL (ref 150.0–400.0)
RBC: 4.05 Mil/uL (ref 3.87–5.11)
RDW: 13.4 % (ref 11.5–15.5)
WBC: 3.7 K/uL — ABNORMAL LOW (ref 4.0–10.5)

## 2024-05-16 LAB — BASIC METABOLIC PANEL WITH GFR
BUN: 12 mg/dL (ref 6–23)
CO2: 25 meq/L (ref 19–32)
Calcium: 8.7 mg/dL (ref 8.4–10.5)
Chloride: 109 meq/L (ref 96–112)
Creatinine, Ser: 0.74 mg/dL (ref 0.40–1.20)
GFR: 97.28 mL/min (ref >60.00)
Glucose, Bld: 82 mg/dL (ref 70–99)
Potassium: 3.9 meq/L (ref 3.5–5.1)
Sodium: 140 meq/L (ref 135–145)

## 2024-05-16 LAB — HEPATIC FUNCTION PANEL
ALT: 26 U/L (ref 0–35)
AST: 21 U/L (ref 0–37)
Albumin: 4.1 g/dL (ref 3.5–5.2)
Alkaline Phosphatase: 88 U/L (ref 39–117)
Bilirubin, Direct: 0.1 mg/dL (ref 0.0–0.3)
Total Bilirubin: 0.5 mg/dL (ref 0.2–1.2)
Total Protein: 6.2 g/dL (ref 6.0–8.3)

## 2024-05-16 LAB — LIPID PANEL
Cholesterol: 131 mg/dL (ref 0–200)
HDL: 55.7 mg/dL (ref >39.00)
LDL Cholesterol: 67 mg/dL (ref 0–99)
NonHDL: 75.67
Total CHOL/HDL Ratio: 2
Triglycerides: 44 mg/dL (ref 0.0–149.0)
VLDL: 8.8 mg/dL (ref 0.0–40.0)

## 2024-05-16 LAB — IBC + FERRITIN
Ferritin: 6.2 ng/mL — ABNORMAL LOW (ref 10.0–291.0)
Iron: 55 ug/dL (ref 42–145)
Saturation Ratios: 16 % — ABNORMAL LOW (ref 20.0–50.0)
TIBC: 344.4 ug/dL (ref 250.0–450.0)
Transferrin: 246 mg/dL (ref 212.0–360.0)

## 2024-05-16 MED ORDER — MUPIROCIN 2 % EX OINT: 1.0000 | TOPICAL_OINTMENT | Freq: Two times a day (BID) | CUTANEOUS | 0 refills | Status: DC

## 2024-05-16 NOTE — Progress Notes (Signed)
 Subjective:    Patient ID: Erica Hernandez, female    DOB: 1976-07-30, 48 y.o.   MRN: [id number]  Patient here for  Chief Complaint  Patient presents with   Annual Exam    HPI Here for a physical exam.   Seeing Dr Rennie for IDA - receiving IV venofer . Previous visit, reported increased stress and dizziness/tachycardia. Saw ENT - recommended steroid nasal spray. Zio monitor placed given persistent symptoms - revealed no afib, no sustained arrhythmias. Discussed ENT reevaluation. She wanted to wait until her tooth was pulled. Tooth has been pulled. Dizziness was better. Has noticed recently - some intermittent ear discomfort - left ear worse. Some minimal dizziness. Has been using steroid nasal spray. Being followed by weight management - last evaluated 04/15/24 - recommended to continue phentermine in the am and topamax in the afternoon - per note. Breathing stable. No abdominal pain or bowel change. Some acid reflux - noticed worsening recently. Discussed trial of pepcid.    Past Medical History:  Diagnosis Date   Allergy    Hypertension    during pregnancy   Past Surgical History:  Procedure Laterality Date   CHOLECYSTECTOMY     COLONOSCOPY N/A 09/26/2022   Procedure: COLONOSCOPY;  Surgeon: Jinny Carmine, MD;  Location: [facility];  Service: Endoscopy;  Laterality: N/A;   ESOPHAGOGASTRODUODENOSCOPY N/A 09/26/2022   Procedure: ESOPHAGOGASTRODUODENOSCOPY (EGD);  Surgeon: Jinny Carmine, MD;  Location: [facility];  Service: Endoscopy;  Laterality: N/A;   GIVENS CAPSULE STUDY N/A 11/06/2022   Procedure: GIVENS CAPSULE STUDY;  Surgeon: Jinny Carmine, MD;  Location: [facility];  Service: Endoscopy;  Laterality: N/A;   HERNIA REPAIR     SHOULDER SURGERY  03/30/2011   TONSILLECTOMY  1984   Family History  Problem Relation Age of Onset   Hypertension Father    Diabetes Father    Obesity Father    Breast cancer Maternal Grandmother        51-70   Breast cancer  Paternal Grandmother        6-70   Cancer Paternal Grandmother    Diabetes Other        parent   COPD Paternal Grandfather    Colon cancer Neg Hx    Social History   Socioeconomic History   Marital status: Married    Spouse name: Not on file   Number of children: 1   Years of education: Not on file   Highest education level: 12th grade  Occupational History    Employer: [employer]  Tobacco Use   Smoking status: Never   Smokeless tobacco: Never  Vaping Use   Vaping status: Never Used  Substance and Sexual Activity   Alcohol use: No   Drug use: No   Sexual activity: Yes    Birth control/protection: Condom  Other Topics Concern   Not on file  Social History Narrative   [place]; 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    Social Drivers of Corporate investment banker Strain: Low Risk  (05/09/2024)   Overall Financial Resource Strain (CARDIA)    Difficulty of Paying Living Expenses: Not hard at all  Food Insecurity: No Food Insecurity (05/09/2024)   Hunger Vital Sign    Worried About Running Out of Food in the Last Year: Never true    Ran Out of Food in the Last Year: Never true  Transportation Needs: No Transportation Needs (05/09/2024)   PRAPARE - Administrator, Civil Service (  Medical): No    Lack of Transportation (Non-Medical): No  Physical Activity: Sufficiently Active (05/09/2024)   Exercise Vital Sign    Days of Exercise per Week: 6 days    Minutes of Exercise per Session: 30 min  Stress: No Stress Concern Present (05/09/2024)   [employer] of Occupational Health - Occupational Stress Questionnaire    Feeling of Stress: Only a little  Social Connections: Moderately Integrated (05/09/2024)   Social Connection and Isolation Panel    Frequency of Communication with Friends and Family: More than three times a week    Frequency of Social Gatherings with Friends and Family: More than three times a week    Attends Religious Services:  1 to 4 times per year    Active Member of [employer] or Organizations: No    Attends Engineer, structural: Not on file    Marital Status: Married     Review of Systems  Constitutional:  Negative for appetite change and unexpected weight change.  HENT:  Negative for congestion and sinus pressure.        Some ear discomfort as outlined.   Respiratory:  Negative for cough, chest tightness and shortness of breath.   Cardiovascular:  Negative for chest pain, palpitations and leg swelling.  Gastrointestinal:  Negative for abdominal pain, diarrhea, nausea and vomiting.  Genitourinary:  Negative for difficulty urinating and dysuria.  Musculoskeletal:  Negative for joint swelling and myalgias.  Skin:  Negative for color change and rash.       Small circular lesion (open) - lower abdomen.   Neurological:  Negative for headaches.       Some mild dizziness.   Psychiatric/Behavioral:  Negative for agitation and dysphoric mood.        Objective:     BP 122/78   Pulse 77   Ht 5' 7 (1.702 m)   Wt 201 lb (91.2 kg)   SpO2 99%   BMI 31.48 kg/m  Wt Readings from Last 3 Encounters:  05/16/24 201 lb (91.2 kg)  02/29/24 204 lb 9.6 oz (92.8 kg)  01/29/24 207 lb 12.8 oz (94.3 kg)    Physical Exam Vitals reviewed.  Constitutional:      General: She is not in acute distress.    Appearance: Normal appearance. She is well-developed.  HENT:     Head: Normocephalic and atraumatic.     Right Ear: External ear normal.     Left Ear: External ear normal.     Mouth/Throat:     Pharynx: No oropharyngeal exudate or posterior oropharyngeal erythema.  Eyes:     General: No scleral icterus.       Right eye: No discharge.        Left eye: No discharge.     Conjunctiva/sclera: Conjunctivae normal.  Neck:     Thyroid : No thyromegaly.  Cardiovascular:     Rate and Rhythm: Normal rate and regular rhythm.  Pulmonary:     Effort: No tachypnea, accessory muscle usage or respiratory distress.      Breath sounds: Normal breath sounds. No decreased breath sounds or wheezing.  Chest:  Breasts:    Right: No inverted nipple, mass, nipple discharge or tenderness (no axillary adenopathy).     Left: No inverted nipple, mass, nipple discharge or tenderness (no axilarry adenopathy).  Abdominal:     General: Bowel sounds are normal.     Palpations: Abdomen is soft.     Tenderness: There is no abdominal tenderness.  Musculoskeletal:  General: No swelling or tenderness.     Cervical back: Neck supple.  Lymphadenopathy:     Cervical: No cervical adenopathy.  Skin:    Findings: No erythema or rash.  Neurological:     Mental Status: She is alert and oriented to person, place, and time.  Psychiatric:        Mood and Affect: Mood normal.        Behavior: Behavior normal.         Outpatient Encounter Medications as of 05/16/2024  Medication Sig   Calcium  Carbonate-Vitamin D3 600-400 MG-UNIT TABS Take 1 mg by mouth daily.   cetirizine (ZYRTEC) 10 MG tablet Take by mouth.   [employer] FIBER GUMMIES PO Take by mouth.   ferrous sulfate 325 (65 FE) MG tablet Take 325 mg by mouth daily with breakfast.   fluticasone  (FLONASE ) 50 MCG/ACT nasal spray Place 2 sprays into both nostrils daily.   ipratropium (ATROVENT ) 0.06 % nasal spray Place 2 sprays into both nostrils 4 (four) times daily. (Patient taking differently: Place 2 sprays into both nostrils daily.)   meclizine  (ANTIVERT ) 25 MG tablet Take 1 tablet (25 mg total) by mouth 3 (three) times daily as needed for dizziness.   mupirocin  ointment (BACTROBAN ) 2 % Apply 1 Application topically 2 (two) times daily.   phentermine 37.5 MG capsule Take 37.5 mg by mouth every morning.   topiramate (TOPAMAX) 50 MG tablet Take 50 mg by mouth daily.   tranexamic acid  (LYSTEDA ) 650 MG TABS tablet TAKE 2 TABLETS (1,300 MG TOTAL) BY MOUTH 3 (THREE) TIMES DAILY. AS NEEDED.   No facility-administered encounter medications on file as of 05/16/2024.     Lab Results   Component Value Date   WBC 3.7 (L) 05/16/2024   HGB 12.4 05/16/2024   HCT 37.4 05/16/2024   PLT 219.0 05/16/2024   GLUCOSE 82 05/16/2024   CHOL 131 05/16/2024   TRIG 44.0 05/16/2024   HDL 55.70 05/16/2024   LDLCALC 67 05/16/2024   ALT 26 05/16/2024   AST 21 05/16/2024   NA 140 05/16/2024   K 3.9 05/16/2024   CL 109 05/16/2024   CREATININE 0.74 05/16/2024   BUN 12 05/16/2024   CO2 25 05/16/2024   TSH 1.54 01/28/2024   HGBA1C 5.0 03/03/2022       Assessment & Plan:  Routine general medical examination at a health care facility  Health care maintenance Assessment & Plan: Physical today 05/16/24.  PAP 08/29/19 - negative with negative HPV.  Repeat pap today. Bilateral mammogram 08/11/23 - Birads 0. Follow up left breast mammogram 08/28/23 - Birads II. Recommended annual screening.    Iron  deficiency anemia, unspecified iron  deficiency anemia type Assessment & Plan: History of bariatric surgery.   Seeing Dr. Brahmanday/hematology.  Has received IV iron .  Follow cbc and iron  studies. Had GI w/up as outlined.  Given increased fatigue, wants hgb and iron  studies checked today   Orders: -     CBC with Differential/Platelet -     IBC + Ferritin  Hypertension, essential Assessment & Plan: Blood pressure as outlined. Not orthostatic on exam. On no medication. Follow pressures.   Orders: -     Basic metabolic panel with GFR -     Lipid panel  Abnormal liver function test Assessment & Plan:  Hepatitis C antibody was negative back in 2022.  She also had an ultrasound that did not show any abnormalities or increased echogenicity of the liver (03/11/22).  She also had her gallbladder removed  at the time of the bariatric surgery.  No report of any Advil Aleve Motrin BCs or Goody powder use. No history of alcohol abuse or high risk activities such as IV drug use, homemade tattoos or high risk sexual activity with high risk individuals. Has seen GI previously.  GGT wnl.  Liver panel 01/28/24  - wnl. Follow liver function tests.   Orders: -     Hepatic function panel  Dizziness Assessment & Plan: Previous visit, reported increased stress and dizziness/tachycardia. Saw ENT - recommended steroid nasal spray. Zio monitor placed given persistent symptoms - revealed no afib, no sustained arrhythmias. Discussed ENT reevaluation. She wanted to wait until her tooth was pulled. Tooth has been pulled. Dizziness was better. Has noticed recently - some intermittent ear discomfort - left ear worse. Some minimal dizziness. Has been using steroid nasal spray. Plans to monitor and f/u with ENT if persistent.    Gastroesophageal reflux disease without esophagitis Assessment & Plan: She is status post duodenal switch.  Is status post hiatal hernia repair February 2022.  Followed by Dr. Wolm.  No swallowing problems reported, but does report some occasional acid reflux.  Trial of pepcid.    Tachycardia Assessment & Plan: Zio monitor placed given persistent symptoms - revealed no afib, no sustained arrhythmias. Overall improved. Follow.    Stress Assessment & Plan: Increased stress. Overall appears to be handling things relatively well. Follow.    Other orders -     Mupirocin ; Apply 1 Application topically 2 (two) times daily.  Dispense: 22 g; Refill: 0     Allena Hamilton, MD

## 2024-05-16 NOTE — Patient Instructions (Signed)
Pepcid (famotidine) - 20mg  - take one tablet 30 minutes before evening meal.

## 2024-05-16 NOTE — Assessment & Plan Note (Signed)
 Physical today 05/16/24.  PAP 08/29/19 - negative with negative HPV.  Repeat pap today. Bilateral mammogram 08/11/23 - Birads 0. Follow up left breast mammogram 08/28/23 - Birads II. Recommended annual screening.

## 2024-05-16 NOTE — Assessment & Plan Note (Signed)
 History of bariatric surgery.   Seeing Dr. Brahmanday/hematology.  Has received IV iron .  Follow cbc and iron  studies. Had GI w/up as outlined.  Given increased fatigue, wants hgb and iron  studies checked today

## 2024-05-17 ENCOUNTER — Encounter: Payer: Self-pay | Admitting: Internal Medicine

## 2024-05-20 ENCOUNTER — Inpatient Hospital Stay: Attending: Internal Medicine

## 2024-05-20 VITALS — BP 115/71 | HR 79 | Temp 98.7°F | Resp 16

## 2024-05-20 DIAGNOSIS — D500 Iron deficiency anemia secondary to blood loss (chronic): Secondary | ICD-10-CM | POA: Diagnosis present

## 2024-05-20 DIAGNOSIS — D509 Iron deficiency anemia, unspecified: Secondary | ICD-10-CM

## 2024-05-20 DIAGNOSIS — N920 Excessive and frequent menstruation with regular cycle: Secondary | ICD-10-CM | POA: Insufficient documentation

## 2024-05-20 DIAGNOSIS — E611 Iron deficiency: Secondary | ICD-10-CM

## 2024-05-20 MED ORDER — IRON SUCROSE 20 MG/ML IV SOLN
200.0000 mg | Freq: Once | INTRAVENOUS | Status: AC
Start: 2024-05-20 — End: 2024-05-20
  Administered 2024-05-20: 200 mg via INTRAVENOUS
  Filled 2024-05-20: qty 10

## 2024-05-20 NOTE — Patient Instructions (Signed)

## 2024-05-20 NOTE — Progress Notes (Unsigned)
 Declined post-observation. Aware of risks. Vitals stable at discharge.

## 2024-05-22 ENCOUNTER — Encounter: Payer: Self-pay | Admitting: Internal Medicine

## 2024-05-22 NOTE — Assessment & Plan Note (Signed)
 Hepatitis C antibody was negative back in 2022.  She also had an ultrasound that did not show any abnormalities or increased echogenicity of the liver (03/11/22).  She also had her gallbladder removed at the time of the bariatric surgery.  No report of any Advil Aleve Motrin BCs or Goody powder use. No history of alcohol abuse or high risk activities such as IV drug use, homemade tattoos or high risk sexual activity with high risk individuals. Has seen GI previously.  GGT wnl.  Liver panel 01/28/24 - wnl. Follow liver function tests.

## 2024-05-22 NOTE — Assessment & Plan Note (Signed)
 Blood pressure as outlined. Not orthostatic on exam. On no medication. Follow pressures.

## 2024-05-22 NOTE — Assessment & Plan Note (Signed)
 Previous visit, reported increased stress and dizziness/tachycardia. Saw ENT - recommended steroid nasal spray. Zio monitor placed given persistent symptoms - revealed no afib, no sustained arrhythmias. Discussed ENT reevaluation. She wanted to wait until her tooth was pulled. Tooth has been pulled. Dizziness was better. Has noticed recently - some intermittent ear discomfort - left ear worse. Some minimal dizziness. Has been using steroid nasal spray. Plans to monitor and f/u with ENT if persistent.

## 2024-05-22 NOTE — Assessment & Plan Note (Signed)
Increased stress.  Overall appears to be handling things relatively well.  Follow.   

## 2024-05-22 NOTE — Assessment & Plan Note (Signed)
 She is status post duodenal switch.  Is status post hiatal hernia repair February 2022.  Followed by Dr. Wolm.  No swallowing problems reported, but does report some occasional acid reflux.  Trial of pepcid.

## 2024-05-22 NOTE — Assessment & Plan Note (Signed)
 Zio monitor placed given persistent symptoms - revealed no afib, no sustained arrhythmias. Overall improved. Follow.

## 2024-05-27 ENCOUNTER — Inpatient Hospital Stay

## 2024-05-27 VITALS — BP 114/91 | HR 85 | Temp 97.8°F | Resp 19

## 2024-05-27 DIAGNOSIS — D500 Iron deficiency anemia secondary to blood loss (chronic): Secondary | ICD-10-CM | POA: Diagnosis not present

## 2024-05-27 DIAGNOSIS — E611 Iron deficiency: Secondary | ICD-10-CM

## 2024-05-27 DIAGNOSIS — D509 Iron deficiency anemia, unspecified: Secondary | ICD-10-CM

## 2024-05-27 MED ORDER — IRON SUCROSE 20 MG/ML IV SOLN
200.0000 mg | Freq: Once | INTRAVENOUS | Status: AC
Start: 2024-05-27 — End: 2024-05-27
  Administered 2024-05-27: 200 mg via INTRAVENOUS
  Filled 2024-05-27: qty 10

## 2024-05-27 MED ORDER — SODIUM CHLORIDE 0.9% FLUSH
10.0000 mL | Freq: Once | INTRAVENOUS | Status: AC | PRN
  Administered 2024-05-27: 10 mL
  Filled 2024-05-27: qty 10

## 2024-06-07 ENCOUNTER — Encounter: Payer: Self-pay | Admitting: Emergency Medicine

## 2024-06-07 ENCOUNTER — Ambulatory Visit
Admission: EM | Admit: 2024-06-07 | Discharge: 2024-06-07 | Disposition: A | Discharge status: Home / Self Care | Attending: Physician Assistant | Admitting: Physician Assistant

## 2024-06-07 ENCOUNTER — Encounter: Payer: Self-pay | Admitting: Internal Medicine

## 2024-06-07 DIAGNOSIS — L723 Sebaceous cyst: Secondary | ICD-10-CM

## 2024-06-07 MED ORDER — DOXYCYCLINE HYCLATE 100 MG PO CAPS: 100.0000 mg | ORAL_CAPSULE | Freq: Two times a day (BID) | ORAL | 0 refills | Status: AC

## 2024-06-07 NOTE — ED Provider Notes (Signed)
 [facility]    CSN: [id number] Arrival date & time: 06/07/24  1615      History   Chief Complaint Chief Complaint  Patient presents with   Abscess    HPI DELESHA POHLMAN is a 48 y.o. female presenting for 3 day history of redness around a cyst of the upper chest. She reports noting a cyst in the area for the past few months but it has not caused pain until now. No drainage. Has not treated condition in any way.   HPI  Past Medical History:  Diagnosis Date   Allergy    Hypertension    during pregnancy    Patient Active Problem List   Diagnosis Date Noted   Tachycardia 01/30/2024   Dizziness 01/28/2024   Encounter for weight loss counseling 09/30/2023   Ear fullness 09/30/2023   Menorrhagia with irregular cycle 03/25/2023   Toenail fungus 09/22/2021   Fatigue 09/17/2021   BMI 29.0-29.9,adult 01/04/2021   Hiatal hernia 01/04/2021   Tail bone pain 11/17/2020   Dysphagia 11/17/2020   Gastroesophageal reflux disease without esophagitis 10/11/2020   Abnormal liver function test 10/11/2020   Iron  deficiency 07/18/2020   Menstrual irregularity 03/10/2020   Iron  deficiency anemia 01/10/2020   Cervical cancer screening 09/04/2019   Encounter for birth control 09/04/2019   Anemia 08/31/2019   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/29/2019   Myopia of both eyes 11/24/2018   Presbyopia 11/24/2018   Health care maintenance 10/14/2015   Abdominal cyst 04/23/2015   Hypertension, essential 11/19/2014   Stress 07/23/2014   Fullness in head 07/23/2014   Headache 03/20/2014    Past Surgical History:  Procedure Laterality Date   CHOLECYSTECTOMY     COLONOSCOPY N/A 09/26/2022   Procedure: COLONOSCOPY;  Surgeon: Jinny Carmine, MD;  Location: [facility];  Service: Endoscopy;  Laterality: N/A;   ESOPHAGOGASTRODUODENOSCOPY N/A 09/26/2022   Procedure: ESOPHAGOGASTRODUODENOSCOPY (EGD);  Surgeon: Jinny Carmine, MD;  Location: [facility];  Service: Endoscopy;   Laterality: N/A;   GIVENS CAPSULE STUDY N/A 11/06/2022   Procedure: GIVENS CAPSULE STUDY;  Surgeon: Jinny Carmine, MD;  Location: [facility];  Service: Endoscopy;  Laterality: N/A;   HERNIA REPAIR     SHOULDER SURGERY  03/30/2011   TONSILLECTOMY  1984    OB History   No obstetric history on file.      Home Medications    Prior to Admission medications   Medication Sig Start Date End Date Taking? Authorizing Provider  doxycycline  (VIBRAMYCIN ) 100 MG capsule Take 1 capsule (100 mg total) by mouth 2 (two) times daily for 7 days. 06/07/24 06/14/24 Yes Arvis Jolan NOVAK, PA-C  Calcium  Carbonate-Vitamin D3 600-400 MG-UNIT TABS Take 1 mg by mouth daily.    [provider]  cetirizine (ZYRTEC) 10 MG tablet Take by mouth.    [provider]  [employer] FIBER GUMMIES PO Take by mouth.    [provider]  ferrous sulfate 325 (65 FE) MG tablet Take 325 mg by mouth daily with breakfast.    [provider]  fluticasone  (FLONASE ) 50 MCG/ACT nasal spray Place 2 sprays into both nostrils daily. 10/29/23   Bernardino Ditch, NP  ipratropium (ATROVENT ) 0.06 % nasal spray Place 2 sprays into both nostrils 4 (four) times daily. Patient taking differently: Place 2 sprays into both nostrils daily. 12/09/23   Bernardino Ditch, NP  meclizine  (ANTIVERT ) 25 MG tablet Take 1 tablet (25 mg total) by mouth 3 (three) times daily as needed for dizziness. 01/22/24  Rodriguez-Southworth, Sylvia, PA-C  mupirocin  ointment (BACTROBAN ) 2 % Apply 1 Application topically 2 (two) times daily. 05/16/24   Glendia Shad, MD  phentermine 37.5 MG capsule Take 37.5 mg by mouth every morning.    [provider]  topiramate (TOPAMAX) 50 MG tablet Take 50 mg by mouth daily. 12/18/23 05/16/24  [provider]  tranexamic acid  (LYSTEDA ) 650 MG TABS tablet TAKE 2 TABLETS (1,300 MG TOTAL) BY MOUTH 3 (THREE) TIMES DAILY. AS NEEDED. 05/10/24   Glendia Shad, MD    Family History Family History  Problem  Relation Age of Onset   Hypertension Father    Diabetes Father    Obesity Father    Breast cancer Maternal Grandmother        60-70   Breast cancer Paternal Grandmother        38-70   Cancer Paternal Grandmother    Diabetes Other        parent   COPD Paternal Grandfather    Colon cancer Neg Hx     Social History Social History   Tobacco Use   Smoking status: Never   Smokeless tobacco: Never  Vaping Use   Vaping status: Never Used  Substance Use Topics   Alcohol use: No   Drug use: No     Allergies   Sulfa antibiotics   Review of Systems Review of Systems  Constitutional:  Negative for fatigue and fever.  Skin:  Positive for color change. Negative for rash and wound.     Physical Exam Triage Vital Signs ED Triage Vitals  Encounter Vitals Group     BP 06/07/24 1636 101/67     Girls Systolic BP Percentile --      Girls Diastolic BP Percentile --      Boys Systolic BP Percentile --      Boys Diastolic BP Percentile --      Pulse Rate 06/07/24 1636 80     Resp 06/07/24 1636 18     Temp 06/07/24 1636 98.8 F (37.1 C)     Temp Source 06/07/24 1636 Oral     SpO2 06/07/24 1636 98 %     Weight --      Height --      Head Circumference --      Peak Flow --      Pain Score 06/07/24 1635 3     Pain Loc --      Pain Education --      Exclude from Growth Chart --    No data found.  Updated Vital Signs BP 101/67 (BP Location: Right Arm)   Pulse 80   Temp 98.8 F (37.1 C) (Oral)   Resp 18   LMP 05/30/2024 (Exact Date)   SpO2 98%       Physical Exam Vitals and nursing note reviewed.  Constitutional:      General: She is not in acute distress.    Appearance: Normal appearance. She is not ill-appearing or toxic-appearing.  HENT:     Head: Normocephalic and atraumatic.  Eyes:     General: No scleral icterus.       Right eye: No discharge.        Left eye: No discharge.     Conjunctiva/sclera: Conjunctivae normal.  Cardiovascular:     Rate and  Rhythm: Normal rate and regular rhythm.     Heart sounds: Normal heart sounds.  Pulmonary:     Effort: Pulmonary effort is normal. No respiratory distress.  Breath sounds: Normal breath sounds.  Musculoskeletal:     Cervical back: Neck supple.  Skin:    General: Skin is dry.     Findings: Erythema (2 cm area of erythema, induration w/o fluctunace of left upper chest. Area is TTP.) present.  Neurological:     General: No focal deficit present.     Mental Status: She is alert. Mental status is at baseline.     Motor: No weakness.     Gait: Gait normal.  Psychiatric:        Mood and Affect: Mood normal.        Behavior: Behavior normal.      UC Treatments / Results  Labs (all labs ordered are listed, but only abnormal results are displayed) Labs Reviewed - No data to display  EKG   Radiology No results found.  Procedures Procedures (including critical care time)  Medications Ordered in UC Medications - No data to display  Initial Impression / Assessment and Plan / UC Course  I have reviewed the triage vital signs and the nursing notes.  Pertinent labs & imaging results that were available during my care of the patient were reviewed by me and considered in my medical decision making (see chart for details).   48 y/o female presents for 3 day history of inflamed sebaceous cyst. No OTC meds or home remedies attempted.   See image included in chart. Discussed options with patient including I&D in urgent care without guarantee cyst won't return or antibiotics/supportive care and referral. Patient would like derm referral and antibiotics. Sent referral and doxycycline . Discussed frequent warm compresses. Encouraged her to return if symptoms worsen before she can see dermatology.    Final Clinical Impressions(s) / UC Diagnoses   Final diagnoses:  Inflamed sebaceous cyst     Discharge Instructions      -Start antibiotics -Warm compresses frequently throughout the  day -I put a referral in to derm -If redness or swelling worsens please return     ED Prescriptions     Medication Sig Dispense Auth. Provider   doxycycline  (VIBRAMYCIN ) 100 MG capsule Take 1 capsule (100 mg total) by mouth 2 (two) times daily for 7 days. 14 capsule Arvis Jolan NOVAK, PA-C      PDMP not reviewed this encounter.   Arvis Jolan NOVAK, PA-C 06/07/24 1755

## 2024-06-07 NOTE — Telephone Encounter (Signed)
 I do not mind placing a referral to dermatology, but does she have a preference of which dermatologist she sees. If increased pain and redness,, sounds like she may need to be seen more acutely.

## 2024-06-07 NOTE — Telephone Encounter (Signed)
 Called to offer patient an appointment for tomorrow. She is going to go to [facility] this PM so she does not have to miss work any this week. She is already off early today. Pt says she will f/u after and let me know if referral is still needed.

## 2024-06-07 NOTE — ED Triage Notes (Signed)
 Pt presents with an abscess on her left shoulder blade for a few month. The past 3 days it became red and painful.

## 2024-06-07 NOTE — Discharge Instructions (Addendum)
-  Start antibiotics -Warm compresses frequently throughout the day -I put a referral in to derm -If redness or swelling worsens please return

## 2024-06-16 ENCOUNTER — Encounter: Payer: Self-pay | Admitting: Internal Medicine

## 2024-06-18 ENCOUNTER — Ambulatory Visit
Admission: EM | Admit: 2024-06-18 | Discharge: 2024-06-18 | Disposition: A | Discharge status: Home / Self Care | Attending: Physician Assistant | Admitting: Physician Assistant

## 2024-06-18 DIAGNOSIS — R5383 Other fatigue: Secondary | ICD-10-CM | POA: Insufficient documentation

## 2024-06-18 DIAGNOSIS — R42 Dizziness and giddiness: Secondary | ICD-10-CM | POA: Diagnosis not present

## 2024-06-18 DIAGNOSIS — R350 Frequency of micturition: Secondary | ICD-10-CM | POA: Diagnosis not present

## 2024-06-18 LAB — URINALYSIS, ROUTINE W REFLEX MICROSCOPIC
Bilirubin Urine: NEGATIVE
Glucose, UA: NEGATIVE mg/dL
Hgb urine dipstick: NEGATIVE
Ketones, ur: NEGATIVE mg/dL
Nitrite: NEGATIVE
Protein, ur: NEGATIVE mg/dL
Specific Gravity, Urine: 1.015 (ref 1.005–1.030)
pH: 7 (ref 5.0–8.0)

## 2024-06-18 LAB — COMPREHENSIVE METABOLIC PANEL WITH GFR
ALT: 40 U/L (ref 0–44)
AST: 27 U/L (ref 15–41)
Albumin: 4 g/dL (ref 3.5–5.0)
Alkaline Phosphatase: 93 U/L (ref 38–126)
Anion gap: 8 (ref 5–15)
BUN: 18 mg/dL (ref 6–20)
CO2: 22 mmol/L (ref 22–32)
Calcium: 9.1 mg/dL (ref 8.9–10.3)
Chloride: 107 mmol/L (ref 98–111)
Creatinine, Ser: 0.86 mg/dL (ref 0.44–1.00)
GFR, Estimated: >60 mL/min (ref >60)
Glucose, Bld: 71 mg/dL (ref 70–99)
Potassium: 4.4 mmol/L (ref 3.5–5.1)
Sodium: 137 mmol/L (ref 135–145)
Total Bilirubin: 0.9 mg/dL (ref 0.0–1.2)
Total Protein: 7.1 g/dL (ref 6.5–8.1)

## 2024-06-18 LAB — CBC WITH DIFFERENTIAL/PLATELET
Abs Immature Granulocytes: 0.02 K/uL (ref 0.00–0.07)
Basophils Absolute: 0 K/uL (ref 0.0–0.1)
Basophils Relative: 1 %
Eosinophils Absolute: 0.1 K/uL (ref 0.0–0.5)
Eosinophils Relative: 2 %
HCT: 39.1 % (ref 36.0–46.0)
Hemoglobin: 13.2 g/dL (ref 12.0–15.0)
Immature Granulocytes: 0 %
Lymphocytes Relative: 27 %
Lymphs Abs: 1.6 K/uL (ref 0.7–4.0)
MCH: 30.8 pg (ref 26.0–34.0)
MCHC: 33.8 g/dL (ref 30.0–36.0)
MCV: 91.4 fL (ref 80.0–100.0)
Monocytes Absolute: 0.4 K/uL (ref 0.1–1.0)
Monocytes Relative: 7 %
Neutro Abs: 3.8 K/uL (ref 1.7–7.7)
Neutrophils Relative %: 63 %
Platelets: 206 K/uL (ref 150–400)
RBC: 4.28 MIL/uL (ref 3.87–5.11)
RDW: 12.8 % (ref 11.5–15.5)
WBC: 6 K/uL (ref 4.0–10.5)
nRBC: 0 % (ref 0.0–0.2)

## 2024-06-18 LAB — URINALYSIS, MICROSCOPIC (REFLEX): RBC / HPF: NONE SEEN RBC/hpf (ref 0–5)

## 2024-06-18 LAB — PREGNANCY, URINE: Preg Test, Ur: NEGATIVE

## 2024-06-18 NOTE — ED Triage Notes (Signed)
 Pt c/o dizziness & fatigue x6 days. Hx of anemia. Concerned for low iron . Has reached out to PCP for blood work but was told to come in for eval.

## 2024-06-18 NOTE — ED Provider Notes (Signed)
 [facility]    CSN: [id number] Arrival date & time: 06/18/24  0805      History   Chief Complaint Chief Complaint  Patient presents with   Dizziness   Fatigue    HPI Erica Hernandez is a 48 y.o. female.   Patient presents today with a 6-day history of extreme fatigue and episodic lightheadedness.  She describes lightheadedness as feeling woozy and as though she might pass out but has not had any syncopal episodes.  Denies any room spinning sensation.  She does have a history of anemia and has had similar symptoms in the past when she has been anemic.  She was last seen by her primary care on 05/14/2024 at which point she had mild leukopenia but her hemoglobin levels were normal.  She has been taking iron  supplementation as previously recommended.  Denies any recent medication changes other than an antibiotic that was prescribed for an inflamed sebaceous cyst that she finished a few days ago.  She denies any heavy blood loss but does report intermittent heavy menstrual cycles.  LMP 05/30/2024.  She denies any recent falls or head injury.  Denies any recent illness or additional symptoms including fever, cough, congestion.  She denies any melena, hematochezia, hematemesis, abnormal bruising.  She is eating and drinking normally.    Past Medical History:  Diagnosis Date   Allergy    Hypertension    during pregnancy    Patient Active Problem List   Diagnosis Date Noted   Tachycardia 01/30/2024   Dizziness 01/28/2024   Encounter for weight loss counseling 09/30/2023   Ear fullness 09/30/2023   Menorrhagia with irregular cycle 03/25/2023   Toenail fungus 09/22/2021   Fatigue 09/17/2021   BMI 29.0-29.9,adult 01/04/2021   Hiatal hernia 01/04/2021   Tail bone pain 11/17/2020   Dysphagia 11/17/2020   Gastroesophageal reflux disease without esophagitis 10/11/2020   Abnormal liver function test 10/11/2020   Iron  deficiency 07/18/2020   Menstrual irregularity 03/10/2020    Iron  deficiency anemia 01/10/2020   Cervical cancer screening 09/04/2019   Encounter for birth control 09/04/2019   Anemia 08/31/2019   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/29/2019   Myopia of both eyes 11/24/2018   Presbyopia 11/24/2018   Health care maintenance 10/14/2015   Abdominal cyst 04/23/2015   Hypertension, essential 11/19/2014   Stress 07/23/2014   Fullness in head 07/23/2014   Headache 03/20/2014    Past Surgical History:  Procedure Laterality Date   CHOLECYSTECTOMY     COLONOSCOPY N/A 09/26/2022   Procedure: COLONOSCOPY;  Surgeon: Jinny Carmine, MD;  Location: [facility];  Service: Endoscopy;  Laterality: N/A;   ESOPHAGOGASTRODUODENOSCOPY N/A 09/26/2022   Procedure: ESOPHAGOGASTRODUODENOSCOPY (EGD);  Surgeon: Jinny Carmine, MD;  Location: [facility];  Service: Endoscopy;  Laterality: N/A;   GIVENS CAPSULE STUDY N/A 11/06/2022   Procedure: GIVENS CAPSULE STUDY;  Surgeon: Jinny Carmine, MD;  Location: [facility];  Service: Endoscopy;  Laterality: N/A;   HERNIA REPAIR     SHOULDER SURGERY  03/30/2011   TONSILLECTOMY  1984    OB History   No obstetric history on file.      Home Medications    Prior to Admission medications   Medication Sig Start Date End Date Taking? Authorizing Provider  Calcium  Carbonate-Vitamin D3 600-400 MG-UNIT TABS Take 1 mg by mouth daily.    [provider]  cetirizine (ZYRTEC) 10 MG tablet Take by mouth.    [provider]  [employer] FIBER GUMMIES PO Take  by mouth.    [provider]  ferrous sulfate 325 (65 FE) MG tablet Take 325 mg by mouth daily with breakfast.    [provider]  fluticasone  (FLONASE ) 50 MCG/ACT nasal spray Place 2 sprays into both nostrils daily. 10/29/23   Bernardino Ditch, NP  ipratropium (ATROVENT ) 0.06 % nasal spray Place 2 sprays into both nostrils 4 (four) times daily. Patient taking differently: Place 2 sprays into both nostrils daily. 12/09/23   Bernardino Ditch, NP   meclizine  (ANTIVERT ) 25 MG tablet Take 1 tablet (25 mg total) by mouth 3 (three) times daily as needed for dizziness. 01/22/24   Rodriguez-Southworth, Sylvia, PA-C  mupirocin  ointment (BACTROBAN ) 2 % Apply 1 Application topically 2 (two) times daily. 05/16/24   Glendia Shad, MD  phentermine 37.5 MG capsule Take 37.5 mg by mouth every morning.    [provider]  topiramate (TOPAMAX) 50 MG tablet Take 50 mg by mouth daily. 12/18/23 05/16/24  [provider]  tranexamic acid  (LYSTEDA ) 650 MG TABS tablet TAKE 2 TABLETS (1,300 MG TOTAL) BY MOUTH 3 (THREE) TIMES DAILY. AS NEEDED. 05/10/24   Glendia Shad, MD    Family History Family History  Problem Relation Age of Onset   Hypertension Father    Diabetes Father    Obesity Father    Breast cancer Maternal Grandmother        44-70   Breast cancer Paternal Grandmother        75-70   Cancer Paternal Grandmother    Diabetes Other        parent   COPD Paternal Grandfather    Colon cancer Neg Hx     Social History Social History   Tobacco Use   Smoking status: Never   Smokeless tobacco: Never  Vaping Use   Vaping status: Never Used  Substance Use Topics   Alcohol use: No   Drug use: No     Allergies   Sulfa antibiotics   Review of Systems Review of Systems  Constitutional:  Positive for activity change. Negative for appetite change, fatigue and fever.  HENT:  Negative for congestion and sore throat.   Eyes:  Negative for visual disturbance.  Respiratory:  Negative for cough and shortness of breath.   Cardiovascular:  Negative for chest pain.  Gastrointestinal:  Negative for diarrhea, nausea and vomiting.  Genitourinary:  Negative for menstrual problem and vaginal bleeding.  Skin:  Negative for rash.  Neurological:  Positive for light-headedness. Negative for dizziness, seizures, syncope, facial asymmetry, speech difficulty, weakness and headaches.     Physical Exam Triage Vital Signs ED Triage Vitals   Encounter Vitals Group     BP 06/18/24 0817 120/78     Girls Systolic BP Percentile --      Girls Diastolic BP Percentile --      Boys Systolic BP Percentile --      Boys Diastolic BP Percentile --      Pulse Rate 06/18/24 0817 86     Resp 06/18/24 0817 16     Temp 06/18/24 0817 98.6 F (37 C)     Temp Source 06/18/24 0817 Oral     SpO2 06/18/24 0817 99 %     Weight 06/18/24 0817 198 lb (89.8 kg)     Height 06/18/24 0817 5' 7 (1.702 m)     Head Circumference --      Peak Flow --      Pain Score 06/18/24 0820 0     Pain Loc --  Pain Education --      Exclude from Growth Chart --    Orthostatic VS for the past 24 hrs:  BP- Lying Pulse- Lying BP- Sitting Pulse- Sitting  06/18/24 0837 121/81 90 117/82 90    Updated Vital Signs BP 120/78 (BP Location: Right Arm)   Pulse 86   Temp 98.6 F (37 C) (Oral)   Resp 16   Ht 5' 7 (1.702 m)   Wt 198 lb (89.8 kg)   LMP 05/30/2024 (Exact Date)   SpO2 99%   BMI 31.01 kg/m   Visual Acuity Right Eye Distance:   Left Eye Distance:   Bilateral Distance:    Right Eye Near:   Left Eye Near:    Bilateral Near:     Physical Exam Vitals reviewed.  Constitutional:      General: She is awake. She is not in acute distress.    Appearance: Normal appearance. She is well-developed. She is not ill-appearing.     Comments: Very pleasant female appears stated age in no acute distress sitting comfortably in exam room  HENT:     Head: Normocephalic and atraumatic. No raccoon eyes, Battle's sign or contusion.     Right Ear: Tympanic membrane, ear canal and external ear normal. No hemotympanum.     Left Ear: Tympanic membrane, ear canal and external ear normal. No hemotympanum.     Nose: Nose normal.     Mouth/Throat:     Tongue: Tongue does not deviate from midline.     Pharynx: Uvula midline. No oropharyngeal exudate or posterior oropharyngeal erythema.  Eyes:     Extraocular Movements: Extraocular movements intact.     Pupils:  Pupils are equal, round, and reactive to light.  Cardiovascular:     Rate and Rhythm: Normal rate and regular rhythm.     Heart sounds: Normal heart sounds, S1 normal and S2 normal. No murmur heard. Pulmonary:     Effort: Pulmonary effort is normal.     Breath sounds: Normal breath sounds. No wheezing, rhonchi or rales.     Comments: Clear to auscultation bilaterally Musculoskeletal:     Right lower leg: No edema.     Left lower leg: No edema.     Comments: Strength 5/5 bilateral upper and lower extremities  Lymphadenopathy:     Head:     Right side of head: No submental, submandibular or tonsillar adenopathy.     Left side of head: No submental, submandibular or tonsillar adenopathy.  Skin:    Findings: No rash.  Neurological:     General: No focal deficit present.     Mental Status: She is alert and oriented to person, place, and time.     Cranial Nerves: Cranial nerves 2-12 are intact.     Motor: Motor function is intact.     Coordination: Coordination is intact.     Gait: Gait is intact.     Comments: Cranial nerves II through XII grossly intact.  No focal neurological defect on exam.  Psychiatric:        Behavior: Behavior is cooperative.      UC Treatments / Results  Labs (all labs ordered are listed, but only abnormal results are displayed) Labs Reviewed  URINALYSIS, ROUTINE W REFLEX MICROSCOPIC - Abnormal; Notable for the following components:      Result Value   Leukocytes,Ua TRACE (*)    All other components within normal limits  URINALYSIS, MICROSCOPIC (REFLEX) - Abnormal; Notable for the following components:  Bacteria, UA RARE (*)    All other components within normal limits  URINE CULTURE  CBC WITH DIFFERENTIAL/PLATELET  COMPREHENSIVE METABOLIC PANEL WITH GFR  PREGNANCY, URINE    EKG   Radiology No results found.  Procedures Procedures (including critical care time)  Medications Ordered in UC Medications - No data to display  Initial  Impression / Assessment and Plan / UC Course  I have reviewed the triage vital signs and the nursing notes.  Pertinent labs & imaging results that were available during my care of the patient were reviewed by me and considered in my medical decision making (see chart for details).     Patient is well-appearing, afebrile, nontoxic, nontachycardic.  She has normal vital signs and her neurological exam was normal in clinic today with no indication for emergent evaluation or imaging.  EKG was obtained that showed normal sinus rhythm with ventricular rate of 76 bpm without ischemic changes; compared to 10/29/2023 tracing no significant change.  CBC was normal with no evidence of significant anemia.  Metabolic panel was normal with normal liver/kidney function and electrolytes.  UA did not show any significant dehydration but did show trace leuks with rare bacteria.  Upon further discussing this with patient she did report some chronic urinary frequency and this was sent for culture to ensure that she does not have an underlying UTI.  Will defer antibiotics until culture results are available.  Orthostatic vital signs were appropriate.  Recommended that she push fluids and eat small frequent meals.  She is to follow-up closely with her primary care.  We discussed that if she has any worsening or changing symptoms including syncopal episode, worsening lightheadedness or weakness, chest pain, shortness of breath, palpitations she needs to be seen emergently.  Strict return precautions given.  All questions answered to patient satisfaction.  Final Clinical Impressions(s) / UC Diagnoses   Final diagnoses:  Episodic lightheadedness  Fatigue, unspecified type  Urinary frequency     Discharge Instructions      All of your blood work came back normal.  Your EKG is normal.  We will contact you if your urine culture is abnormal.  Make sure that you are drinking plenty of fluid and eat small frequent meals.   Follow-up with your primary care first thing next week if your symptoms do not go away.  If anything worsens and you have lightheadedness, passing out, chest pain, shortness of breath, generalized weakness, nausea/vomiting, blood in your stool or other bleeding you need to go to the ER.     ED Prescriptions   None    PDMP not reviewed this encounter.   Sherrell Rocky POUR, PA-C 06/18/24 [id number]

## 2024-06-18 NOTE — Discharge Instructions (Addendum)
 All of your blood work came back normal.  Your EKG is normal.  We will contact you if your urine culture is abnormal.  Make sure that you are drinking plenty of fluid and eat small frequent meals.  Follow-up with your primary care first thing next week if your symptoms do not go away.  If anything worsens and you have lightheadedness, passing out, chest pain, shortness of breath, generalized weakness, nausea/vomiting, blood in your stool or other bleeding you need to go to the ER.

## 2024-06-19 LAB — URINE CULTURE

## 2024-06-20 ENCOUNTER — Encounter: Payer: Self-pay | Admitting: Internal Medicine

## 2024-06-20 ENCOUNTER — Ambulatory Visit (HOSPITAL_COMMUNITY): Payer: Self-pay

## 2024-06-20 NOTE — Telephone Encounter (Signed)
 Per chart review stated pt didn't need abx at this time. Lab stated may need recollection. Please advise

## 2024-06-20 NOTE — Telephone Encounter (Signed)
 Spoke with pt and she stated that she is not having any urinary symptoms.

## 2024-06-20 NOTE — Telephone Encounter (Signed)
 Please call and see how she is doing. What symptoms is she currently having?  The culture results are reviewed and only recollect if concern regarding symptoms/infection.  If recollect - clean catch urine.

## 2024-07-14 ENCOUNTER — Other Ambulatory Visit: Payer: Self-pay | Admitting: Internal Medicine

## 2024-07-14 DIAGNOSIS — Z1231 Encounter for screening mammogram for malignant neoplasm of breast: Secondary | ICD-10-CM

## 2024-07-25 ENCOUNTER — Ambulatory Visit: Admitting: Internal Medicine

## 2024-07-25 ENCOUNTER — Encounter: Payer: Self-pay | Admitting: Internal Medicine

## 2024-07-25 VITALS — BP 106/68 | HR 65 | Resp 16 | Ht 67.0 in | Wt 207.0 lb

## 2024-07-25 DIAGNOSIS — Z23 Encounter for immunization: Secondary | ICD-10-CM | POA: Diagnosis not present

## 2024-07-25 DIAGNOSIS — D509 Iron deficiency anemia, unspecified: Secondary | ICD-10-CM

## 2024-07-25 DIAGNOSIS — F439 Reaction to severe stress, unspecified: Secondary | ICD-10-CM

## 2024-07-25 DIAGNOSIS — R42 Dizziness and giddiness: Secondary | ICD-10-CM

## 2024-07-25 DIAGNOSIS — I10 Essential (primary) hypertension: Secondary | ICD-10-CM | POA: Diagnosis not present

## 2024-07-25 DIAGNOSIS — R000 Tachycardia, unspecified: Secondary | ICD-10-CM

## 2024-07-25 DIAGNOSIS — K219 Gastro-esophageal reflux disease without esophagitis: Secondary | ICD-10-CM

## 2024-07-25 MED ORDER — AZELASTINE HCL 0.1 % NA SOLN: 1.0000 | Freq: Two times a day (BID) | NASAL | 1 refills | Status: AC

## 2024-07-25 NOTE — Assessment & Plan Note (Signed)
 Blood pressure as outlined. Not orthostatic on exam. On no medication. Follow pressures.

## 2024-07-25 NOTE — Assessment & Plan Note (Signed)
 Zio monitor placed given persistent symptoms - revealed no afib, no sustained arrhythmias. Overall improved. Follow.

## 2024-07-25 NOTE — Progress Notes (Signed)
 Subjective:    Patient ID: Erica Hernandez, female    DOB: [birth year], 48 y.o.   MRN: [id number]  Patient here for  Chief Complaint  Patient presents with   Medical Management of Chronic Issues    HPI Here for a scheduled follow up - follow up regarding IDA - seeing Dr Rennie, dizziness and hypertension. Previously saw ENT - recommended steroid nasal spray. Also had zio monitor placed - no afib and no sustained arrhythmias. After tooth pulled - dizziness improved. Still has intermittent episodes. Overall better, but is a persistent intermittent issue. No known triggers. Is taking zyrtec daily. Did not feel steroid nasal spray helped. Stopped. Recently noticed having increased drainage. Discussed astelin  nasal spray. Being followed by weight management - last evaluated 04/15/24 - recommended to continue phentermine in the am and topamax in the afternoon - per note. Last visit, reported some increased acid reflux. Discussed trial of pepcid. She was doing relatively well with pepcid. Forgot to take pepcid to the beach last week and noticed flare of increased acid reflux. Eating differently at the beach as well. S/p iron  infusion x 2 - 05/20/24 and 05/27/24. Evaluated UC 06/18/24 - light headedness. W/up unrevealing. Not orthostatic. Labs ok. Urine culture no definite infection. Discussed f/u with ENT given persistent symptoms. Will notify me when agreeable. Increased stress - work stress. Discussed. Has f/u next week with Dr Rennie.    Past Medical History:  Diagnosis Date   Allergy    Hypertension    during pregnancy   Past Surgical History:  Procedure Laterality Date   CHOLECYSTECTOMY     COLONOSCOPY N/A 09/26/2022   Procedure: COLONOSCOPY;  Surgeon: Jinny Carmine, MD;  Location: [facility];  Service: Endoscopy;  Laterality: N/A;   ESOPHAGOGASTRODUODENOSCOPY N/A 09/26/2022   Procedure: ESOPHAGOGASTRODUODENOSCOPY (EGD);  Surgeon: Jinny Carmine, MD;  Location: [facility];   Service: Endoscopy;  Laterality: N/A;   GIVENS CAPSULE STUDY N/A 11/06/2022   Procedure: GIVENS CAPSULE STUDY;  Surgeon: Jinny Carmine, MD;  Location: [facility] END[facility];  Service: Endoscopy;  Laterality: N/A;   HERNIA REPAIR     SHOULDER SURGERY  03/30/2011   TONSILLECTOMY  1984   Family History  Problem Relation Age of Onset   Hypertension Father    Diabetes Father    Obesity Father    Breast cancer Maternal Grandmother        17-70   Breast cancer Paternal Grandmother        51-70   Cancer Paternal Grandmother    Diabetes Other        parent   COPD Paternal Grandfather    Colon cancer Neg Hx    Social History   Socioeconomic History   Marital status: Married    Spouse name: Not on file   Number of children: 1   Years of education: Not on file   Highest education level: 12th grade  Occupational History    Employer: [employer]  Tobacco Use   Smoking status: Never   Smokeless tobacco: Never  Vaping Use   Vaping status: Never Used  Substance and Sexual Activity   Alcohol use: No   Drug use: No   Sexual activity: Yes    Birth control/protection: Condom  Other Topics Concern   Not on file  Social History Narrative   Lives in [place]; 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    Social Drivers of Health   Financial Resource Strain: Low Risk  (05/09/2024)   Overall Financial  Resource Strain (CARDIA)    Difficulty of Paying Living Expenses: Not hard at all  Food Insecurity: No Food Insecurity (05/09/2024)   Hunger Vital Sign    Worried About Running Out of Food in the Last Year: Never true    Ran Out of Food in the Last Year: Never true  Transportation Needs: No Transportation Needs (05/09/2024)   PRAPARE - Administrator, Civil Service (Medical): No    Lack of Transportation (Non-Medical): No  Physical Activity: Sufficiently Active (05/09/2024)   Exercise Vital Sign    Days of Exercise per Week: 6 days    Minutes of Exercise per Session: 30 min   Stress: No Stress Concern Present (05/09/2024)   [employer] of Occupational Health - Occupational Stress Questionnaire    Feeling of Stress: Only a little  Social Connections: Moderately Integrated (05/09/2024)   Social Connection and Isolation Panel    Frequency of Communication with Friends and Family: More than three times a week    Frequency of Social Gatherings with Friends and Family: More than three times a week    Attends Religious Services: 1 to 4 times per year    Active Member of [employer] or Organizations: No    Attends Engineer, structural: Not on file    Marital Status: Married     Review of Systems  Constitutional:  Positive for fatigue. Negative for appetite change and unexpected weight change.  HENT:  Positive for congestion and postnasal drip.   Respiratory:  Negative for cough, chest tightness and shortness of breath.   Cardiovascular:  Negative for chest pain, palpitations and leg swelling.  Gastrointestinal:  Negative for abdominal pain, diarrhea, nausea and vomiting.  Genitourinary:  Negative for difficulty urinating and dysuria.  Musculoskeletal:  Negative for joint swelling and myalgias.  Skin:  Negative for color change and rash.  Neurological:  Negative for headaches.       Still with intermittent episodes of dizziness/light headedness.   Psychiatric/Behavioral:  Negative for agitation and dysphoric mood.        Increased stress.        Objective:     BP 106/68   Pulse 65   Resp 16   Ht 5' 7 (1.702 m)   Wt 207 lb (93.9 kg)   SpO2 99%   BMI 32.42 kg/m  Wt Readings from Last 3 Encounters:  07/25/24 207 lb (93.9 kg)  06/18/24 198 lb (89.8 kg)  05/16/24 201 lb (91.2 kg)    Physical Exam Vitals reviewed.  Constitutional:      General: She is not in acute distress.    Appearance: Normal appearance.  HENT:     Head: Normocephalic and atraumatic.     Right Ear: External ear normal.     Left Ear: External ear normal.      Mouth/Throat:     Pharynx: No oropharyngeal exudate or posterior oropharyngeal erythema.  Eyes:     General: No scleral icterus.       Right eye: No discharge.        Left eye: No discharge.     Conjunctiva/sclera: Conjunctivae normal.  Neck:     Thyroid : No thyromegaly.  Cardiovascular:     Rate and Rhythm: Normal rate and regular rhythm.  Pulmonary:     Effort: No respiratory distress.     Breath sounds: Normal breath sounds. No wheezing.  Abdominal:     General: Bowel sounds are normal.     Palpations: Abdomen  is soft.     Tenderness: There is no abdominal tenderness.  Musculoskeletal:        General: No swelling or tenderness.     Cervical back: Neck supple. No tenderness.  Lymphadenopathy:     Cervical: No cervical adenopathy.  Skin:    Findings: No erythema or rash.  Neurological:     Mental Status: She is alert.  Psychiatric:        Mood and Affect: Mood normal.        Behavior: Behavior normal.         Outpatient Encounter Medications as of 07/25/2024  Medication Sig   azelastine  (ASTELIN ) 0.1 % nasal spray Place 1 spray into both nostrils 2 (two) times daily. Use in each nostril as directed   Calcium  Carbonate-Vitamin D3 600-400 MG-UNIT TABS Take 1 mg by mouth daily.   cetirizine (ZYRTEC) 10 MG tablet Take by mouth.   [employer] FIBER GUMMIES PO Take by mouth.   ferrous sulfate 325 (65 FE) MG tablet Take 325 mg by mouth daily with breakfast.   mupirocin  ointment (BACTROBAN ) 2 % Apply 1 Application topically 2 (two) times daily.   phentermine 37.5 MG capsule Take 37.5 mg by mouth every morning.   topiramate (TOPAMAX) 50 MG tablet Take 50 mg by mouth daily.   tranexamic acid  (LYSTEDA ) 650 MG TABS tablet TAKE 2 TABLETS (1,300 MG TOTAL) BY MOUTH 3 (THREE) TIMES DAILY. AS NEEDED.   [DISCONTINUED] fluticasone  (FLONASE ) 50 MCG/ACT nasal spray Place 2 sprays into both nostrils daily.   [DISCONTINUED] ipratropium (ATROVENT ) 0.06 % nasal spray Place 2 sprays into both nostrils  4 (four) times daily. (Patient taking differently: Place 2 sprays into both nostrils daily.)   [DISCONTINUED] meclizine  (ANTIVERT ) 25 MG tablet Take 1 tablet (25 mg total) by mouth 3 (three) times daily as needed for dizziness.   No facility-administered encounter medications on file as of 07/25/2024.     Lab Results  Component Value Date   WBC 6.0 06/18/2024   HGB 13.2 06/18/2024   HCT 39.1 06/18/2024   PLT 206 06/18/2024   GLUCOSE 71 06/18/2024   CHOL 131 05/16/2024   TRIG 44.0 05/16/2024   HDL 55.70 05/16/2024   LDLCALC 67 05/16/2024   ALT 40 06/18/2024   AST 27 06/18/2024   NA 137 06/18/2024   K 4.4 06/18/2024   CL 107 06/18/2024   CREATININE 0.86 06/18/2024   BUN 18 06/18/2024   CO2 22 06/18/2024   TSH 1.54 01/28/2024   HGBA1C 5.0 03/03/2022    No results found.     Assessment & Plan:  Immunization due -     Flu vaccine trivalent PF, 6mos and older(Flulaval,Afluria,Fluarix,Fluzone)  Stress Assessment & Plan: Increased stress. Overall appears to be handing things relatively well. Notify if she feels needs further intervention.    Iron  deficiency anemia, unspecified iron  deficiency anemia type Assessment & Plan: History of bariatric surgery.   Seeing Dr. Brahmanday/hematology.  Has received IV iron .  Follow cbc and iron  studies. Has had GI w/up. Reports increased fatigue - persistent.  She is scheduled to have labs checked next week through hematology and f/u the following week.    Hypertension, essential Assessment & Plan: Blood pressure as outlined. Not orthostatic on exam. On no medication. Follow pressures.    Gastroesophageal reflux disease without esophagitis Assessment & Plan: She is status post duodenal switch.  Is status post hiatal hernia repair February 2022.  Followed by Dr. Wolm.  No swallowing problems reported, but  does report occasional acid reflux.  Last visit, reported some increased acid reflux. Discussed trial of pepcid. She was doing  relatively well with pepcid. Forgot to take pepcid to the beach last week and noticed flare of increased acid reflux. She was eating differently at the beach. Back on her pepcid now. Discussed f/u with GI given immediate worsening symptoms off pepcid for short period. Wants to restart pepcid and follow.  Call with update. Call if persistent acid reflux.    Dizziness Assessment & Plan: Saw ENT - recommended steroid nasal spray. Zio monitor placed given persistent symptoms - revealed no afib, no sustained arrhythmias. Discussed ENT reevaluation. She wanted to wait until her tooth was pulled. Tooth has been pulled. Dizziness was better. Still feels dizziness is better, but is a persistent intermittent issue for her. Increased nasal congestion/drainage as outlined. Continue zyrtec. Did not feel steroid nasal spray helped. Trial of astelin  nasal spray. Discussed f/u with ENT. Wants to hold.    Tachycardia Assessment & Plan: Zio monitor placed given persistent symptoms - revealed no afib, no sustained arrhythmias. Overall improved. Follow.    Other orders -     Azelastine  HCl; Place 1 spray into both nostrils 2 (two) times daily. Use in each nostril as directed  Dispense: 30 mL; Refill: 1     Allena Hamilton, MD

## 2024-07-25 NOTE — Assessment & Plan Note (Signed)
 She is status post duodenal switch.  Is status post hiatal hernia repair February 2022.  Followed by Dr. Wolm.  No swallowing problems reported, but does report occasional acid reflux.  Last visit, reported some increased acid reflux. Discussed trial of pepcid. She was doing relatively well with pepcid. Forgot to take pepcid to the beach last week and noticed flare of increased acid reflux. She was eating differently at the beach. Back on her pepcid now. Discussed f/u with GI given immediate worsening symptoms off pepcid for short period. Wants to restart pepcid and follow.  Call with update. Call if persistent acid reflux.

## 2024-07-25 NOTE — Assessment & Plan Note (Signed)
 Saw ENT - recommended steroid nasal spray. Zio monitor placed given persistent symptoms - revealed no afib, no sustained arrhythmias. Discussed ENT reevaluation. She wanted to wait until her tooth was pulled. Tooth has been pulled. Dizziness was better. Still feels dizziness is better, but is a persistent intermittent issue for her. Increased nasal congestion/drainage as outlined. Continue zyrtec. Did not feel steroid nasal spray helped. Trial of astelin  nasal spray. Discussed f/u with ENT. Wants to hold.

## 2024-07-25 NOTE — Assessment & Plan Note (Signed)
 Increased stress. Overall appears to be handing things relatively well. Notify if she feels needs further intervention.

## 2024-07-25 NOTE — Assessment & Plan Note (Signed)
 History of bariatric surgery.   Seeing Dr. Brahmanday/hematology.  Has received IV iron .  Follow cbc and iron  studies. Has had GI w/up. Reports increased fatigue - persistent.  She is scheduled to have labs checked next week through hematology and f/u the following week.

## 2024-07-29 ENCOUNTER — Inpatient Hospital Stay: Attending: Internal Medicine

## 2024-07-29 DIAGNOSIS — D500 Iron deficiency anemia secondary to blood loss (chronic): Secondary | ICD-10-CM | POA: Diagnosis present

## 2024-07-29 DIAGNOSIS — N920 Excessive and frequent menstruation with regular cycle: Secondary | ICD-10-CM | POA: Insufficient documentation

## 2024-07-29 DIAGNOSIS — E611 Iron deficiency: Secondary | ICD-10-CM

## 2024-07-29 LAB — BASIC METABOLIC PANEL - CANCER CENTER ONLY
Anion gap: 5 (ref 5–15)
BUN: 15 mg/dL (ref 6–20)
CO2: 24 mmol/L (ref 22–32)
Calcium: 8.8 mg/dL — ABNORMAL LOW (ref 8.9–10.3)
Chloride: 106 mmol/L (ref 98–111)
Creatinine: 0.8 mg/dL (ref 0.44–1.00)
GFR, Estimated: >60 mL/min (ref >60)
Glucose, Bld: 86 mg/dL (ref 70–99)
Potassium: 4.1 mmol/L (ref 3.5–5.1)
Sodium: 135 mmol/L (ref 135–145)

## 2024-07-29 LAB — CBC WITH DIFFERENTIAL (CANCER CENTER ONLY)
Abs Immature Granulocytes: 0.01 K/uL (ref 0.00–0.07)
Basophils Absolute: 0 K/uL (ref 0.0–0.1)
Basophils Relative: 0 %
Eosinophils Absolute: 0.1 K/uL (ref 0.0–0.5)
Eosinophils Relative: 2 %
HCT: 36.2 % (ref 36.0–46.0)
Hemoglobin: 12 g/dL (ref 12.0–15.0)
Immature Granulocytes: 0 %
Lymphocytes Relative: 32 %
Lymphs Abs: 1.5 K/uL (ref 0.7–4.0)
MCH: 30.8 pg (ref 26.0–34.0)
MCHC: 33.1 g/dL (ref 30.0–36.0)
MCV: 93.1 fL (ref 80.0–100.0)
Monocytes Absolute: 0.4 K/uL (ref 0.1–1.0)
Monocytes Relative: 8 %
Neutro Abs: 2.7 K/uL (ref 1.7–7.7)
Neutrophils Relative %: 58 %
Platelet Count: 211 K/uL (ref 150–400)
RBC: 3.89 MIL/uL (ref 3.87–5.11)
RDW: 12.7 % (ref 11.5–15.5)
WBC Count: 4.7 K/uL (ref 4.0–10.5)
nRBC: 0 % (ref 0.0–0.2)

## 2024-07-29 LAB — IRON AND TIBC
Iron: 37 ug/dL (ref 28–170)
Saturation Ratios: 11 % (ref 10.4–31.8)
TIBC: 344 ug/dL (ref 250–450)
UIBC: 307 ug/dL

## 2024-07-29 LAB — FERRITIN: Ferritin: 9 ng/mL — ABNORMAL LOW (ref 11–307)

## 2024-07-29 LAB — VITAMIN B12: Vitamin B-12: 2220 pg/mL — ABNORMAL HIGH (ref 180–914)

## 2024-08-01 ENCOUNTER — Other Ambulatory Visit

## 2024-08-05 ENCOUNTER — Inpatient Hospital Stay

## 2024-08-05 ENCOUNTER — Encounter: Payer: Self-pay | Admitting: Internal Medicine

## 2024-08-05 ENCOUNTER — Inpatient Hospital Stay (HOSPITAL_BASED_OUTPATIENT_CLINIC_OR_DEPARTMENT_OTHER): Admitting: Internal Medicine

## 2024-08-05 VITALS — BP 122/80 | HR 82 | Temp 98.0°F | Resp 18

## 2024-08-05 VITALS — BP 126/81 | HR 84 | Temp 97.9°F | Resp 16 | Ht 67.0 in | Wt 207.7 lb

## 2024-08-05 DIAGNOSIS — E611 Iron deficiency: Secondary | ICD-10-CM

## 2024-08-05 DIAGNOSIS — D500 Iron deficiency anemia secondary to blood loss (chronic): Secondary | ICD-10-CM | POA: Diagnosis not present

## 2024-08-05 DIAGNOSIS — D509 Iron deficiency anemia, unspecified: Secondary | ICD-10-CM

## 2024-08-05 MED ORDER — IRON SUCROSE 20 MG/ML IV SOLN
200.0000 mg | Freq: Once | INTRAVENOUS | Status: AC
  Administered 2024-08-05: 200 mg via INTRAVENOUS
  Filled 2024-08-05: qty 10

## 2024-08-05 MED ORDER — SODIUM CHLORIDE 0.9% FLUSH
10.0000 mL | Freq: Once | INTRAVENOUS | Status: AC | PRN
  Administered 2024-08-05: 10 mL
  Filled 2024-08-05: qty 10

## 2024-08-05 NOTE — Assessment & Plan Note (Addendum)
#  Iron  deficiency anemia-multifactorial/malabsorption-bariatric surgery; heavy menstrual cycles. S/p IV iron . On Bariatric advantage MVT.   # Today hemoglobin is 12.5  however patient is symptomatic and heavy menstrual cycles- SEP 2-25-  ferritin- 9 -  proceed with Venofer  weekly- and do more infusion.   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   # DISPOSITION: # venofer  today-  #  venofer  every other week x 5 more # follow up in 5 months- MD; 2-3 days prior- labs- cbc/bmp; ironstudies/ferritin; B12 levels- possible venofer - Dr.B

## 2024-08-05 NOTE — Progress Notes (Signed)
 Fatigue/weakness: YES Dyspena: NO  Light headedness: YES Blood in stool: NO   C/o fatigue, lightheadedness and has had a headache off and on for 1 week.

## 2024-08-05 NOTE — Progress Notes (Signed)
 [facility] CONSULT NOTE  Patient Care Team: Glendia Shad, MD as PCP - General (Internal Medicine) Rennie Cindy SAUNDERS, MD as Consulting Physician (Oncology)  CHIEF COMPLAINTS/PURPOSE OF CONSULTATION:    HEMATOLOGY HISTORY  # IRON  DEF ANEMIA -malabsorption /heavy menstrual cycles. 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  Also capsule study [last as per patient].   #Chronic fatigue- Dx: OSA; intolerant to CPAP; 2022-sleep study negative.    HISTORY OF PRESENTING ILLNESS: Alone.  Ambulating independently.  Erica Hernandez 48 y.o.  female with iron  deficiency sec to malabsorption/duodenal switch/heavy menstrual cycles is here for follow-up.  Patient has been on intermittent iron  infusions. Complains of ongoing fatigue.  No nausea vomiting.  Patient denies any blood in stools or black-colored stools.  Patient denies any nausea vomiting abdominal pain.  Review of Systems  Constitutional:  Positive for malaise/fatigue and weight loss (Intentional/gastric bypass). Negative for chills, diaphoresis and fever.  HENT:  Negative for nosebleeds and sore throat.   Eyes:  Negative for double vision.  Respiratory:  Negative for cough, hemoptysis, sputum production, shortness of breath and wheezing.   Cardiovascular:  Negative for chest pain, palpitations, orthopnea and leg swelling.  Gastrointestinal:  Negative for abdominal pain, blood in stool, constipation, diarrhea, heartburn, melena, nausea and vomiting.  Genitourinary:  Negative for dysuria, frequency and urgency.  Musculoskeletal:  Negative for back pain and joint pain.  Skin: Negative.  Negative for itching and rash.  Neurological:  Negative for dizziness, tingling, focal weakness, weakness and headaches.  Endo/Heme/Allergies:  Does not bruise/bleed easily.   Psychiatric/Behavioral:  Negative for depression. The patient is not nervous/anxious and does not have insomnia.     MEDICAL HISTORY:  Past Medical History:  Diagnosis Date   Allergy    Hypertension    during pregnancy    SURGICAL HISTORY: Past Surgical History:  Procedure Laterality Date   CHOLECYSTECTOMY     COLONOSCOPY N/A 09/26/2022   Procedure: COLONOSCOPY;  Surgeon: Jinny Carmine, MD;  Location: [facility];  Service: Endoscopy;  Laterality: N/A;   ESOPHAGOGASTRODUODENOSCOPY N/A 09/26/2022   Procedure: ESOPHAGOGASTRODUODENOSCOPY (EGD);  Surgeon: Jinny Carmine, MD;  Location: [facility];  Service: Endoscopy;  Laterality: N/A;   GIVENS CAPSULE STUDY N/A 11/06/2022   Procedure: GIVENS CAPSULE STUDY;  Surgeon: Jinny Carmine, MD;  Location: [facility] ENDOSCOPY;  Service: Endoscopy;  Laterality: N/A;   HERNIA REPAIR     SHOULDER SURGERY  03/30/2011   TONSILLECTOMY  1984    SOCIAL HISTORY: Social History   Socioeconomic History   Marital status: Married    Spouse name: Not on file   Number of children: 1   Years of education: Not on file   Highest education level: 12th grade  Occupational History    Employer: [employer]  Tobacco Use   Smoking status: Never   Smokeless tobacco: Never  Vaping Use   Vaping status: Never Used  Substance and Sexual Activity   Alcohol use: No   Drug use: No   Sexual activity: Yes    Birth control/protection: Condom  Other Topics Concern   Not on file  Social History Narrative   Lives in [place]; 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    Social Drivers of Corporate investment banker Strain: Low Risk  (05/09/2024)   Overall Financial Resource Strain (CARDIA)  Difficulty of Paying Living Expenses: Not hard at all  Food Insecurity: No Food Insecurity (05/09/2024)   Hunger Vital Sign    Worried About Running Out of Food in the Last Year: Never true    Ran Out of Food in the Last Year: Never true  Transportation  Needs: No Transportation Needs (05/09/2024)   PRAPARE - Administrator, Civil Service (Medical): No    Lack of Transportation (Non-Medical): No  Physical Activity: Sufficiently Active (05/09/2024)   Exercise Vital Sign    Days of Exercise per Week: 6 days    Minutes of Exercise per Session: 30 min  Stress: No Stress Concern Present (05/09/2024)   [employer] of Occupational Health - Occupational Stress Questionnaire    Feeling of Stress: Only a little  Social Connections: Moderately Integrated (05/09/2024)   Social Connection and Isolation Panel    Frequency of Communication with Friends and Family: More than three times a week    Frequency of Social Gatherings with Friends and Family: More than three times a week    Attends Religious Services: 1 to 4 times per year    Active Member of [employer] or Organizations: No    Attends Engineer, structural: Not on file    Marital Status: Married  Catering manager Violence: Not on file    FAMILY HISTORY: Family History  Problem Relation Age of Onset   Hypertension Father    Diabetes Father    Obesity Father    Breast cancer Maternal Grandmother        27-70   Breast cancer Paternal Grandmother        43-70   Cancer Paternal Grandmother    Diabetes Other        parent   COPD Paternal Grandfather    Colon cancer Neg Hx     ALLERGIES:  is allergic to sulfa antibiotics.  MEDICATIONS:  Current Outpatient Medications  Medication Sig Dispense Refill   azelastine  (ASTELIN ) 0.1 % nasal spray Place 1 spray into both nostrils 2 (two) times daily. Use in each nostril as directed 30 mL 1   Calcium  Carbonate-Vitamin D3 600-400 MG-UNIT TABS Take 1 mg by mouth daily.     cetirizine (ZYRTEC) 10 MG tablet Take by mouth.     [employer] FIBER GUMMIES PO Take by mouth.     ferrous sulfate 325 (65 FE) MG tablet Take 325 mg by mouth daily with breakfast.     mupirocin  ointment (BACTROBAN ) 2 % Apply 1 Application topically 2 (two) times  daily. 22 g 0   phentermine 37.5 MG capsule Take 37.5 mg by mouth every morning.     topiramate (TOPAMAX) 50 MG tablet Take 50 mg by mouth daily.     tranexamic acid  (LYSTEDA ) 650 MG TABS tablet TAKE 2 TABLETS (1,300 MG TOTAL) BY MOUTH 3 (THREE) TIMES DAILY. AS NEEDED. 30 tablet 0   No current facility-administered medications for this visit.   Facility-Administered Medications Ordered in Other Visits  Medication Dose Route Frequency Provider Last Rate Last Admin   sodium chloride  flush (NS) 0.9 % injection 10 mL  10 mL Intracatheter Once PRN Arlicia Paquette R, MD          PHYSICAL EXAMINATION:   Vitals:   08/05/24 1330  BP: 126/81  Pulse: 84  Resp: 16  Temp: 97.9 F (36.6 C)  SpO2: 100%   Filed Weights   08/05/24 1330  Weight: 207 lb 11.2 oz (94.2 kg)    Physical  Exam HENT:     Head: Normocephalic and atraumatic.     Mouth/Throat:     Pharynx: No oropharyngeal exudate.  Eyes:     Pupils: Pupils are equal, round, and reactive to light.  Cardiovascular:     Rate and Rhythm: Normal rate and regular rhythm.  Pulmonary:     Effort: Pulmonary effort is normal. No respiratory distress.     Breath sounds: Normal breath sounds. No wheezing.  Abdominal:     General: Bowel sounds are normal. There is no distension.     Palpations: Abdomen is soft. There is no mass.     Tenderness: There is no abdominal tenderness. There is no guarding or rebound.  Musculoskeletal:        General: No tenderness. Normal range of motion.     Cervical back: Normal range of motion and neck supple.  Skin:    General: Skin is warm.  Neurological:     Mental Status: She is alert and oriented to person, place, and time.  Psychiatric:        Mood and Affect: Affect normal.     LABORATORY DATA:  I have reviewed the data as listed Lab Results  Component Value Date   WBC 4.7 07/29/2024   HGB 12.0 07/29/2024   HCT 36.2 07/29/2024   MCV 93.1 07/29/2024   PLT 211 07/29/2024   Recent  Labs    09/30/23 0823 01/28/24 0953 05/16/24 0834 06/18/24 0837 07/29/24 1516  NA 138 140 140 137 135  K 4.0 4.3 3.9 4.4 4.1  CL 107 108 109 107 106  CO2 26 26 25 22 24   GLUCOSE 83 90 82 71 86  BUN 19 13 12 18 15   CREATININE 0.75 0.80 0.74 0.86 0.80  CALCIUM  9.0 9.2 8.7 9.1 8.8*  GFRNONAA  --   --   --  >60 >60  PROT 6.3 6.5 6.2 7.1  --   ALBUMIN 4.2 4.2 4.1 4.0  --   AST 17 19 21 27   --   ALT 15 24 26  40  --   ALKPHOS 79 92 88 93  --   BILITOT 0.5 0.4 0.5 0.9  --   BILIDIR 0.1 0.1 0.1  --   --     No results found.  Iron  deficiency #Iron  deficiency anemia-multifactorial/malabsorption-bariatric surgery; heavy menstrual cycles. S/p IV iron . On Bariatric advantage MVT.   # Today hemoglobin is 12.5  however patient is symptomatic and heavy menstrual cycles- SEP 2-25-  ferritin- 9 -  proceed with Venofer  weekly- and do more infusion.   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   # DISPOSITION: # venofer  today-  #  venofer  every other week x 5 more # follow up in 5 months- MD; 2-3 days prior- labs- cbc/bmp; ironstudies/ferritin; B12 levels- possible venofer - Dr.B  All questions were answered. The patient knows to call the clinic with any problems, questions or concerns.   Cindy JONELLE Joe, MD 08/05/2024 2:10 PM

## 2024-08-18 ENCOUNTER — Other Ambulatory Visit: Payer: Self-pay | Admitting: Internal Medicine

## 2024-08-23 ENCOUNTER — Inpatient Hospital Stay: Attending: Internal Medicine

## 2024-08-23 VITALS — BP 115/70 | HR 79 | Temp 97.5°F

## 2024-08-23 DIAGNOSIS — D509 Iron deficiency anemia, unspecified: Secondary | ICD-10-CM

## 2024-08-23 DIAGNOSIS — E611 Iron deficiency: Secondary | ICD-10-CM

## 2024-08-23 DIAGNOSIS — N920 Excessive and frequent menstruation with regular cycle: Secondary | ICD-10-CM | POA: Insufficient documentation

## 2024-08-23 DIAGNOSIS — D500 Iron deficiency anemia secondary to blood loss (chronic): Secondary | ICD-10-CM | POA: Diagnosis present

## 2024-08-23 MED ORDER — IRON SUCROSE 20 MG/ML IV SOLN
200.0000 mg | Freq: Once | INTRAVENOUS | Status: AC
  Administered 2024-08-23: 200 mg via INTRAVENOUS
  Filled 2024-08-23: qty 10

## 2024-08-23 MED ORDER — SODIUM CHLORIDE 0.9% FLUSH
10.0000 mL | Freq: Once | INTRAVENOUS | Status: AC | PRN
  Administered 2024-08-23: 10 mL
  Filled 2024-08-23: qty 10

## 2024-08-30 ENCOUNTER — Ambulatory Visit
Admission: RE | Admit: 2024-08-30 | Discharge: 2024-08-30 | Disposition: A | Discharge status: Home / Self Care | Source: Ambulatory Visit | Attending: Internal Medicine | Admitting: Internal Medicine

## 2024-08-30 DIAGNOSIS — Z1231 Encounter for screening mammogram for malignant neoplasm of breast: Secondary | ICD-10-CM | POA: Insufficient documentation

## 2024-09-09 ENCOUNTER — Inpatient Hospital Stay

## 2024-09-09 VITALS — BP 108/71 | HR 84 | Temp 98.3°F

## 2024-09-09 DIAGNOSIS — E611 Iron deficiency: Secondary | ICD-10-CM

## 2024-09-09 DIAGNOSIS — D509 Iron deficiency anemia, unspecified: Secondary | ICD-10-CM

## 2024-09-09 DIAGNOSIS — D500 Iron deficiency anemia secondary to blood loss (chronic): Secondary | ICD-10-CM | POA: Diagnosis not present

## 2024-09-09 MED ORDER — IRON SUCROSE 20 MG/ML IV SOLN
200.0000 mg | Freq: Once | INTRAVENOUS | Status: AC
  Administered 2024-09-09: 200 mg via INTRAVENOUS
  Filled 2024-09-09: qty 10

## 2024-09-23 ENCOUNTER — Inpatient Hospital Stay: Attending: Internal Medicine

## 2024-09-23 VITALS — BP 120/73 | HR 75 | Temp 97.6°F | Resp 18

## 2024-09-23 DIAGNOSIS — E611 Iron deficiency: Secondary | ICD-10-CM

## 2024-09-23 DIAGNOSIS — D500 Iron deficiency anemia secondary to blood loss (chronic): Secondary | ICD-10-CM | POA: Diagnosis present

## 2024-09-23 DIAGNOSIS — N920 Excessive and frequent menstruation with regular cycle: Secondary | ICD-10-CM | POA: Diagnosis present

## 2024-09-23 DIAGNOSIS — D509 Iron deficiency anemia, unspecified: Secondary | ICD-10-CM

## 2024-09-23 MED ORDER — IRON SUCROSE 20 MG/ML IV SOLN
200.0000 mg | Freq: Once | INTRAVENOUS | Status: AC
  Administered 2024-09-23: 200 mg via INTRAVENOUS
  Filled 2024-09-23: qty 10

## 2024-09-23 NOTE — Patient Instructions (Signed)

## 2024-09-26 ENCOUNTER — Ambulatory Visit: Admitting: Internal Medicine

## 2024-09-26 DIAGNOSIS — E611 Iron deficiency: Secondary | ICD-10-CM

## 2024-09-26 DIAGNOSIS — I10 Essential (primary) hypertension: Secondary | ICD-10-CM

## 2024-09-27 ENCOUNTER — Ambulatory Visit: Admitting: Internal Medicine

## 2024-09-27 VITALS — BP 110/70 | HR 76 | Temp 98.2°F | Ht 67.0 in | Wt 207.4 lb

## 2024-09-27 DIAGNOSIS — F439 Reaction to severe stress, unspecified: Secondary | ICD-10-CM

## 2024-09-27 DIAGNOSIS — D509 Iron deficiency anemia, unspecified: Secondary | ICD-10-CM

## 2024-09-27 DIAGNOSIS — I10 Essential (primary) hypertension: Secondary | ICD-10-CM | POA: Diagnosis not present

## 2024-09-27 DIAGNOSIS — K219 Gastro-esophageal reflux disease without esophagitis: Secondary | ICD-10-CM

## 2024-09-27 DIAGNOSIS — R7989 Other specified abnormal findings of blood chemistry: Secondary | ICD-10-CM

## 2024-09-27 DIAGNOSIS — J329 Chronic sinusitis, unspecified: Secondary | ICD-10-CM

## 2024-09-27 MED ORDER — AMOXICILLIN 875 MG PO TABS: 875.0000 mg | ORAL_TABLET | Freq: Two times a day (BID) | ORAL | 0 refills | Status: AC

## 2024-09-27 NOTE — Progress Notes (Signed)
 Subjective:    Patient ID: Erica Hernandez, female    DOB: Jul 27, 1976, 48 y.o.   MRN: [id number]  Patient here for  Chief Complaint  Patient presents with   Medical Management of Chronic Issues    2 mth f/u    HPI Here for a scheduled follow up - follow up regarding hypertension, dizziness and IDA - followed by Dr Rennie. Previously saw ENT - recommended steroid nasal spray. Also had zio monitor placed - no afib and no sustained arrhythmias. Also has had problems with acid reflux. Was taking pepcid. Had f/u 09/16/24 - weight management - recommended changing to lomaira and continue topamax. Also recommended protonix  and gaviscon. She is watching what she eats and time eating and taking protonix  and symptoms appear to be controlled. Had f/u with Dr Rennie 08/05/24 - hgb 12.5. recommended venofer  - receiving weekly - for total of 6. Still with some fatigue. Continues to have drainage. Increase pressure - head - feels like her sinus infections. The previous light headedness had improved. No chest congestion or sob.    Past Medical History:  Diagnosis Date   Allergy    Hypertension    during pregnancy   Past Surgical History:  Procedure Laterality Date   CHOLECYSTECTOMY     COLONOSCOPY N/A 09/26/2022   Procedure: COLONOSCOPY;  Surgeon: Jinny Carmine, MD;  Location: [facility];  Service: Endoscopy;  Laterality: N/A;   ESOPHAGOGASTRODUODENOSCOPY N/A 09/26/2022   Procedure: ESOPHAGOGASTRODUODENOSCOPY (EGD);  Surgeon: Jinny Carmine, MD;  Location: [facility];  Service: Endoscopy;  Laterality: N/A;   GIVENS CAPSULE STUDY N/A 11/06/2022   Procedure: GIVENS CAPSULE STUDY;  Surgeon: Jinny Carmine, MD;  Location: [facility];  Service: Endoscopy;  Laterality: N/A;   HERNIA REPAIR     SHOULDER SURGERY  03/30/2011   TONSILLECTOMY  1984   Family History  Problem Relation Age of Onset   Hypertension Father    Diabetes Father    Obesity Father    Breast cancer Maternal  Grandmother        38-70   Breast cancer Paternal Grandmother        41-70   Cancer Paternal Grandmother    Diabetes Other        parent   COPD Paternal Grandfather    Colon cancer Neg Hx    Social History   Socioeconomic History   Marital status: Married    Spouse name: Not on file   Number of children: 1   Years of education: Not on file   Highest education level: 12th grade  Occupational History    Employer: [employer]  Tobacco Use   Smoking status: Never   Smokeless tobacco: Never  Vaping Use   Vaping status: Never Used  Substance and Sexual Activity   Alcohol use: No   Drug use: No   Sexual activity: Yes    Birth control/protection: Condom  Other Topics Concern   Not on file  Social History Narrative   Lives in [place]; diplomatic services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    Social Drivers of Corporate Investment Banker Strain: Low Risk  (09/23/2024)   Overall Financial Resource Strain (CARDIA)    Difficulty of Paying Living Expenses: Not hard at all  Food Insecurity: No Food Insecurity (09/23/2024)   Hunger Vital Sign    Worried About Running Out of Food in the Last Year: Never true    Ran Out of Food in the Last Year: Never true  Transportation  Needs: No Transportation Needs (09/23/2024)   PRAPARE - Administrator, Civil Service (Medical): No    Lack of Transportation (Non-Medical): No  Physical Activity: Insufficiently Active (09/23/2024)   Exercise Vital Sign    Days of Exercise per Week: 2 days    Minutes of Exercise per Session: 20 min  Stress: No Stress Concern Present (09/23/2024)   [employer] of Occupational Health - Occupational Stress Questionnaire    Feeling of Stress: Only a little  Social Connections: Moderately Integrated (09/23/2024)   Social Connection and Isolation Panel    Frequency of Communication with Friends and Family: More than three times a week    Frequency of Social Gatherings with Friends and Family: Twice a  week    Attends Religious Services: 1 to 4 times per year    Active Member of [employer] or Organizations: No    Attends Engineer, Structural: Not on file    Marital Status: Married     Review of Systems  Constitutional:  Positive for fatigue. Negative for fever and unexpected weight change.  HENT:  Positive for congestion and sinus pressure.   Respiratory:  Negative for cough, chest tightness and shortness of breath.   Cardiovascular:  Negative for chest pain, palpitations and leg swelling.  Gastrointestinal:  Negative for abdominal pain, diarrhea, nausea and vomiting.  Genitourinary:  Negative for difficulty urinating and dysuria.  Musculoskeletal:  Negative for joint swelling and myalgias.  Skin:  Negative for color change and rash.  Neurological:  Negative for dizziness and headaches.  Psychiatric/Behavioral:  Negative for agitation and dysphoric mood.        Objective:     BP 110/70   Pulse 76   Temp 98.2 F (36.8 C) (Oral)   Ht 5' 7 (1.702 m)   Wt 207 lb 6 oz (94.1 kg)   SpO2 98%   BMI 32.48 kg/m  Wt Readings from Last 3 Encounters:  09/27/24 207 lb 6 oz (94.1 kg)  08/05/24 207 lb 11.2 oz (94.2 kg)  07/25/24 207 lb (93.9 kg)    Physical Exam Vitals reviewed.  Constitutional:      General: She is not in acute distress.    Appearance: Normal appearance.  HENT:     Head: Normocephalic and atraumatic.     Right Ear: External ear normal.     Left Ear: External ear normal.  Eyes:     General: No scleral icterus.       Right eye: No discharge.        Left eye: No discharge.     Conjunctiva/sclera: Conjunctivae normal.  Neck:     Thyroid : No thyromegaly.  Cardiovascular:     Rate and Rhythm: Normal rate and regular rhythm.  Pulmonary:     Effort: No respiratory distress.     Breath sounds: Normal breath sounds. No wheezing.  Abdominal:     General: Bowel sounds are normal.     Palpations: Abdomen is soft.     Tenderness: There is no abdominal  tenderness.  Musculoskeletal:        General: No swelling or tenderness.     Cervical back: Neck supple. No tenderness.  Lymphadenopathy:     Cervical: No cervical adenopathy.  Skin:    Findings: No erythema or rash.  Neurological:     Mental Status: She is alert.  Psychiatric:        Mood and Affect: Mood normal.        Behavior:  Behavior normal.         Outpatient Encounter Medications as of 09/27/2024  Medication Sig   amoxicillin  (AMOXIL ) 875 MG tablet Take 1 tablet (875 mg total) by mouth 2 (two) times daily for 10 days.   azelastine  (ASTELIN ) 0.1 % nasal spray Place 1 spray into both nostrils 2 (two) times daily. Use in each nostril as directed   Calcium  Carbonate-Vitamin D3 600-400 MG-UNIT TABS Take 1 mg by mouth daily.   cetirizine (ZYRTEC) 10 MG tablet Take by mouth.   [employer] FIBER GUMMIES PO Take by mouth.   ferrous sulfate 325 (65 FE) MG tablet Take 325 mg by mouth daily with breakfast.   LOMAIRA 8 MG TABS Take by mouth.   mupirocin  ointment (BACTROBAN ) 2 % Apply 1 Application topically 2 (two) times daily.   pantoprazole  (PROTONIX ) 40 MG tablet Take 40 mg by mouth.   phentermine 37.5 MG capsule Take 37.5 mg by mouth every morning.   topiramate (TOPAMAX) 50 MG tablet Take 50 mg by mouth daily.   tranexamic acid  (LYSTEDA ) 650 MG TABS tablet TAKE 2 TABLETS (1,300 MG TOTAL) BY MOUTH 3 (THREE) TIMES DAILY. AS NEEDED.   No facility-administered encounter medications on file as of 09/27/2024.     Lab Results  Component Value Date   WBC 4.7 07/29/2024   HGB 12.0 07/29/2024   HCT 36.2 07/29/2024   PLT 211 07/29/2024   GLUCOSE 86 07/29/2024   CHOL 131 05/16/2024   TRIG 44.0 05/16/2024   HDL 55.70 05/16/2024   LDLCALC 67 05/16/2024   ALT 40 06/18/2024   AST 27 06/18/2024   NA 135 07/29/2024   K 4.1 07/29/2024   CL 106 07/29/2024   CREATININE 0.80 07/29/2024   BUN 15 07/29/2024   CO2 24 07/29/2024   TSH 1.54 01/28/2024   HGBA1C 5.0 03/03/2022    MM 3D  SCREENING MAMMOGRAM BILATERAL BREAST Result Date: 09/02/2024 CLINICAL DATA:  Screening. EXAM: DIGITAL SCREENING BILATERAL MAMMOGRAM WITH TOMOSYNTHESIS AND CAD TECHNIQUE: Bilateral screening digital craniocaudal and mediolateral oblique mammograms were obtained. Bilateral screening digital breast tomosynthesis was performed. The images were evaluated with computer-aided detection. COMPARISON:  Previous exam(s). ACR Breast Density Category b: There are scattered areas of fibroglandular density. FINDINGS: There are no findings suspicious for malignancy. IMPRESSION: No mammographic evidence of malignancy. A result letter of this screening mammogram will be mailed directly to the patient. RECOMMENDATION: Screening mammogram in one year. (Code:SM-B-01Y) BI-RADS CATEGORY  1: Negative. Electronically Signed   By: Alm Parkins M.D.   On: 09/02/2024 14:31       Assessment & Plan:  Stress Assessment & Plan: Overall appears to be handling things relatively well. Follow.    Iron  deficiency anemia, unspecified iron  deficiency anemia type Assessment & Plan: History of bariatric surgery.   Seeing Dr. Brahmanday/hematology.  Has received IV iron .  Follow cbc and iron  studies. Has had GI w/up. Had f/u with Dr Rennie 08/05/24 - hgb 12.5. recommended venofer  - receiving weekly - for total of 6.   Hypertension, essential Assessment & Plan: Blood pressure as outlined. On no medication. Follow pressures.    Gastroesophageal reflux disease without esophagitis Assessment & Plan: She is status post duodenal switch.  Is status post hiatal hernia repair February 2022.  Followed by Dr. Wolm.  Continues on protonix .    Abnormal liver function test Assessment & Plan: Liver function tests wnl 06/2024.    Sinusitis, unspecified chronicity, unspecified location Assessment & Plan: Symptoms appears to be c/w sinus  infection. Saline nasal spray and steroid nasal spray. Amoxicillin  875mg  bid. Follow.  Call with  update.    Other orders -     Amoxicillin ; Take 1 tablet (875 mg total) by mouth 2 (two) times daily for 10 days.  Dispense: 14 tablet; Refill: 0     Allena Hamilton, MD

## 2024-10-06 ENCOUNTER — Encounter: Payer: Self-pay | Admitting: Internal Medicine

## 2024-10-06 NOTE — Assessment & Plan Note (Signed)
Overall appears to be handling things relatively well.  Follow.   

## 2024-10-06 NOTE — Assessment & Plan Note (Signed)
 Symptoms appears to be c/w sinus infection. Saline nasal spray and steroid nasal spray. Amoxicillin  875mg  bid. Follow.  Call with update.

## 2024-10-06 NOTE — Assessment & Plan Note (Signed)
 History of bariatric surgery.   Seeing Dr. Brahmanday/hematology.  Has received IV iron .  Follow cbc and iron  studies. Has had GI w/up. Had f/u with Dr Rennie 08/05/24 - hgb 12.5. recommended venofer  - receiving weekly - for total of 6.

## 2024-10-06 NOTE — Assessment & Plan Note (Signed)
Blood pressure as outlined.  On no medication.  Follow pressures.   

## 2024-10-06 NOTE — Assessment & Plan Note (Signed)
 She is status post duodenal switch.  Is status post hiatal hernia repair February 2022.  Followed by Dr. Wolm.  Continues on protonix .

## 2024-10-06 NOTE — Assessment & Plan Note (Signed)
 Liver function tests wnl 06/29/24.

## 2024-10-11 ENCOUNTER — Inpatient Hospital Stay: Attending: Internal Medicine

## 2024-10-11 VITALS — BP 116/78 | HR 81 | Temp 98.0°F | Resp 18

## 2024-10-11 DIAGNOSIS — D509 Iron deficiency anemia, unspecified: Secondary | ICD-10-CM

## 2024-10-11 DIAGNOSIS — N920 Excessive and frequent menstruation with regular cycle: Secondary | ICD-10-CM | POA: Diagnosis present

## 2024-10-11 DIAGNOSIS — D500 Iron deficiency anemia secondary to blood loss (chronic): Secondary | ICD-10-CM | POA: Diagnosis present

## 2024-10-11 DIAGNOSIS — E611 Iron deficiency: Secondary | ICD-10-CM

## 2024-10-11 MED ORDER — SODIUM CHLORIDE 0.9% FLUSH
10.0000 mL | Freq: Once | INTRAVENOUS | Status: AC | PRN
  Administered 2024-10-11: 10 mL
  Filled 2024-10-11: qty 10

## 2024-10-11 MED ORDER — IRON SUCROSE 20 MG/ML IV SOLN
200.0000 mg | Freq: Once | INTRAVENOUS | Status: AC
  Administered 2024-10-11: 200 mg via INTRAVENOUS
  Filled 2024-10-11: qty 10

## 2024-10-21 ENCOUNTER — Inpatient Hospital Stay

## 2024-10-21 VITALS — BP 125/96 | HR 91 | Temp 97.0°F | Resp 18

## 2024-10-21 DIAGNOSIS — E611 Iron deficiency: Secondary | ICD-10-CM

## 2024-10-21 DIAGNOSIS — D509 Iron deficiency anemia, unspecified: Secondary | ICD-10-CM

## 2024-10-21 DIAGNOSIS — D500 Iron deficiency anemia secondary to blood loss (chronic): Secondary | ICD-10-CM | POA: Diagnosis not present

## 2024-10-21 MED ORDER — IRON SUCROSE 20 MG/ML IV SOLN
200.0000 mg | Freq: Once | INTRAVENOUS | Status: AC
  Administered 2024-10-21: 200 mg via INTRAVENOUS
  Filled 2024-10-21: qty 10

## 2024-11-05 ENCOUNTER — Other Ambulatory Visit: Payer: Self-pay | Admitting: Internal Medicine

## 2024-12-07 ENCOUNTER — Inpatient Hospital Stay: Attending: Internal Medicine

## 2024-12-07 DIAGNOSIS — E611 Iron deficiency: Secondary | ICD-10-CM

## 2024-12-07 DIAGNOSIS — D500 Iron deficiency anemia secondary to blood loss (chronic): Secondary | ICD-10-CM | POA: Insufficient documentation

## 2024-12-07 DIAGNOSIS — N920 Excessive and frequent menstruation with regular cycle: Secondary | ICD-10-CM | POA: Diagnosis present

## 2024-12-07 LAB — CBC WITH DIFFERENTIAL (CANCER CENTER ONLY)
Abs Immature Granulocytes: 0.02 10*3/uL (ref 0.00–0.07)
Basophils Absolute: 0 10*3/uL (ref 0.0–0.1)
Basophils Relative: 1 %
Eosinophils Absolute: 0.2 10*3/uL (ref 0.0–0.5)
Eosinophils Relative: 3 %
HCT: 39.1 % (ref 36.0–46.0)
Hemoglobin: 12.8 g/dL (ref 12.0–15.0)
Immature Granulocytes: 0 %
Lymphocytes Relative: 30 %
Lymphs Abs: 1.5 10*3/uL (ref 0.7–4.0)
MCH: 31.1 pg (ref 26.0–34.0)
MCHC: 32.7 g/dL (ref 30.0–36.0)
MCV: 94.9 fL (ref 80.0–100.0)
Monocytes Absolute: 0.4 10*3/uL (ref 0.1–1.0)
Monocytes Relative: 7 %
Neutro Abs: 2.9 10*3/uL (ref 1.7–7.7)
Neutrophils Relative %: 59 %
Platelet Count: 203 10*3/uL (ref 150–400)
RBC: 4.12 MIL/uL (ref 3.87–5.11)
RDW: 12.3 % (ref 11.5–15.5)
WBC Count: 5 10*3/uL (ref 4.0–10.5)
nRBC: 0 % (ref 0.0–0.2)

## 2024-12-07 LAB — BASIC METABOLIC PANEL WITH GFR
Anion gap: 10 (ref 5–15)
BUN: 14 mg/dL (ref 6–20)
CO2: 21 mmol/L — ABNORMAL LOW (ref 22–32)
Calcium: 9.2 mg/dL (ref 8.9–10.3)
Chloride: 110 mmol/L (ref 98–111)
Creatinine, Ser: 0.74 mg/dL (ref 0.44–1.00)
GFR, Estimated: >60 mL/min
Glucose, Bld: 102 mg/dL — ABNORMAL HIGH (ref 70–99)
Potassium: 4.1 mmol/L (ref 3.5–5.1)
Sodium: 140 mmol/L (ref 135–145)

## 2024-12-07 LAB — FERRITIN: Ferritin: 135 ng/mL (ref 11–307)

## 2024-12-07 LAB — IRON AND TIBC
Iron: 43 ug/dL (ref 28–170)
Saturation Ratios: 14 % (ref 10.4–31.8)
TIBC: 311 ug/dL (ref 250–450)
UIBC: 268 ug/dL

## 2024-12-07 LAB — VITAMIN B12: Vitamin B-12: 2386 pg/mL — ABNORMAL HIGH (ref 180–914)

## 2024-12-08 ENCOUNTER — Encounter: Payer: Self-pay | Admitting: Internal Medicine

## 2024-12-08 ENCOUNTER — Ambulatory Visit: Admitting: Internal Medicine

## 2024-12-08 VITALS — BP 122/74 | HR 95 | Temp 98.8°F | Ht 67.0 in | Wt 215.2 lb

## 2024-12-08 DIAGNOSIS — F439 Reaction to severe stress, unspecified: Secondary | ICD-10-CM

## 2024-12-08 DIAGNOSIS — N926 Irregular menstruation, unspecified: Secondary | ICD-10-CM | POA: Diagnosis not present

## 2024-12-08 DIAGNOSIS — R7989 Other specified abnormal findings of blood chemistry: Secondary | ICD-10-CM

## 2024-12-08 DIAGNOSIS — E611 Iron deficiency: Secondary | ICD-10-CM

## 2024-12-08 DIAGNOSIS — I10 Essential (primary) hypertension: Secondary | ICD-10-CM | POA: Diagnosis not present

## 2024-12-08 DIAGNOSIS — K219 Gastro-esophageal reflux disease without esophagitis: Secondary | ICD-10-CM | POA: Diagnosis not present

## 2024-12-08 DIAGNOSIS — D509 Iron deficiency anemia, unspecified: Secondary | ICD-10-CM | POA: Diagnosis not present

## 2024-12-08 MED ORDER — MUPIROCIN 2 % EX OINT: 1.0000 | TOPICAL_OINTMENT | Freq: Two times a day (BID) | CUTANEOUS | 0 refills | Status: AC

## 2024-12-08 MED ORDER — CEPHALEXIN 500 MG PO CAPS: 500.0000 mg | ORAL_CAPSULE | Freq: Three times a day (TID) | ORAL | 0 refills | Status: AC

## 2024-12-08 NOTE — Progress Notes (Signed)
 "  Subjective:    Patient ID: Erica Hernandez, female    DOB: 01-Jun-1976, 49 y.o.   MRN: [id number]  Patient here for  Chief Complaint  Patient presents with   Medical Management of Chronic Issues    Discuss menstrual cycle     HPI Here for a scheduled follow up - follow up regarding hypertension, dizziness and IDA - followed by Dr Rennie. Previously saw ENT - recommended steroid nasal spray. Also had zio monitor placed - no afib and no sustained arrhythmias. Also has had problems with acid reflux. Was taking pepcid. Had f/u 09/16/24 - weight management - recommended changing to lomaira and continue topamax. Also recommended protonix  and gaviscon.  Had f/u with Dr Rennie 08/05/24 - hgb 12.5. recommended venofer  -  for total of 6.  received iron  infustions. Hgb yesterday 12.8 with ferritin 135. Reports increased stress - work. Discussed. Does not feel she needs any further intervention. Daughter getting married. Reports having periods normally - q 17 days. Last 7 days. Last period 11/08/24. No period since. Denies possibility of being pregnant. Has been evaluated previously by gyn. Treated with lysteda . Was instructed to continue prn. Discussed need for gyn reevaluation with question of need to continue current treatment and further evaluation.    Past Medical History:  Diagnosis Date   Allergy    Hypertension    during pregnancy   Past Surgical History:  Procedure Laterality Date   CHOLECYSTECTOMY     COLONOSCOPY N/A 09/26/2022   Procedure: COLONOSCOPY;  Surgeon: Jinny Carmine, MD;  Location: [facility];  Service: Endoscopy;  Laterality: N/A;   ESOPHAGOGASTRODUODENOSCOPY N/A 09/26/2022   Procedure: ESOPHAGOGASTRODUODENOSCOPY (EGD);  Surgeon: Jinny Carmine, MD;  Location: [facility];  Service: Endoscopy;  Laterality: N/A;   GIVENS CAPSULE STUDY N/A 11/06/2022   Procedure: GIVENS CAPSULE STUDY;  Surgeon: Jinny Carmine, MD;  Location: [facility] END[facility];  Service: Endoscopy;   Laterality: N/A;   HERNIA REPAIR     SHOULDER SURGERY  03/30/2011   TONSILLECTOMY  1984   Family History  Problem Relation Age of Onset   Hypertension Father    Diabetes Father    Obesity Father    Breast cancer Maternal Grandmother        42-70   Breast cancer Paternal Grandmother        75-70   Cancer Paternal Grandmother    Diabetes Other        parent   COPD Paternal Grandfather    Colon cancer Neg Hx    Social History   Socioeconomic History   Marital status: Married    Spouse name: Not on file   Number of children: 1   Years of education: Not on file   Highest education level: 12th grade  Occupational History    Employer: [employer]  Tobacco Use   Smoking status: Never   Smokeless tobacco: Never  Vaping Use   Vaping status: Never Used  Substance and Sexual Activity   Alcohol use: No   Drug use: No   Sexual activity: Yes    Birth control/protection: Condom  Other Topics Concern   Not on file  Social History Narrative   Lives in [place]; diplomatic services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    Social Drivers of Health   Tobacco Use: Low Risk (12/10/2024)   Patient History    Smoking Tobacco Use: Never    Smokeless Tobacco Use: Never    Passive Exposure: Not on file  Financial Resource Strain:  Low Risk (09/23/2024)   Overall Financial Resource Strain (CARDIA)    Difficulty of Paying Living Expenses: Not hard at all  Food Insecurity: No Food Insecurity (09/23/2024)   Epic    Worried About Programme Researcher, Broadcasting/film/video in the Last Year: Never true    Ran Out of Food in the Last Year: Never true  Transportation Needs: No Transportation Needs (09/23/2024)   Epic    Lack of Transportation (Medical): No    Lack of Transportation (Non-Medical): No  Physical Activity: Insufficiently Active (09/23/2024)   Exercise Vital Sign    Days of Exercise per Week: 2 days    Minutes of Exercise per Session: 20 min  Stress: No Stress Concern Present (09/23/2024)   [employer]  of Occupational Health - Occupational Stress Questionnaire    Feeling of Stress: Only a little  Social Connections: Moderately Integrated (09/23/2024)   Social Connection and Isolation Panel    Frequency of Communication with Friends and Family: More than three times a week    Frequency of Social Gatherings with Friends and Family: Twice a week    Attends Religious Services: 1 to 4 times per year    Active Member of [employer] or Organizations: No    Attends Banker Meetings: Not on file    Marital Status: Married  Depression (PHQ2-9): Low Risk (12/09/2024)   Depression (PHQ2-9)    PHQ-2 Score: 0  Alcohol Screen: Not on file  Housing: Low Risk (09/23/2024)   Epic    Unable to Pay for Housing in the Last Year: No    Number of Times Moved in the Last Year: 0    Homeless in the Last Year: No  Utilities: Not on file  Health Literacy: Not on file     Review of Systems  Constitutional:  Negative for appetite change and unexpected weight change.  HENT:  Negative for congestion and sinus pressure.   Respiratory:  Negative for cough, chest tightness and shortness of breath.   Cardiovascular:  Negative for chest pain, palpitations and leg swelling.  Gastrointestinal:  Negative for abdominal pain, diarrhea, nausea and vomiting.  Genitourinary:  Negative for difficulty urinating and dysuria.       Periods as outlined.   Musculoskeletal:  Negative for joint swelling and myalgias.  Skin:  Negative for rash.       Skin lesion - abdomen.   Neurological:  Negative for dizziness and headaches.  Psychiatric/Behavioral:  Negative for agitation.        Increased stress as outlined.        Objective:     BP 122/74   Pulse 95   Temp 98.8 F (37.1 C) (Oral)   Ht 5' 7 (1.702 m)   Wt 215 lb 3.2 oz (97.6 kg)   LMP 11/07/2024 (Approximate)   SpO2 99%   BMI 33.71 kg/m  Wt Readings from Last 3 Encounters:  12/09/24 217 lb 8 oz (98.7 kg)  12/08/24 215 lb 3.2 oz (97.6 kg)  09/27/24  207 lb 6 oz (94.1 kg)    Physical Exam Vitals reviewed.  Constitutional:      General: She is not in acute distress.    Appearance: Normal appearance.  HENT:     Head: Normocephalic and atraumatic.     Right Ear: External ear normal.     Left Ear: External ear normal.     Mouth/Throat:     Pharynx: No oropharyngeal exudate or posterior oropharyngeal erythema.  Eyes:  General: No scleral icterus.       Right eye: No discharge.        Left eye: No discharge.     Conjunctiva/sclera: Conjunctivae normal.  Neck:     Thyroid : No thyromegaly.  Cardiovascular:     Rate and Rhythm: Normal rate and regular rhythm.  Pulmonary:     Effort: No respiratory distress.     Breath sounds: Normal breath sounds. No wheezing.  Abdominal:     General: Bowel sounds are normal.     Palpations: Abdomen is soft.     Tenderness: There is no abdominal tenderness.  Musculoskeletal:        General: No swelling or tenderness.     Cervical back: Neck supple. No tenderness.  Lymphadenopathy:     Cervical: No cervical adenopathy.  Skin:    Findings: No rash.     Comments: Lesion - abdomen - minimal erythema surrounding.   Neurological:     Mental Status: She is alert.  Psychiatric:        Mood and Affect: Mood normal.        Behavior: Behavior normal.         Outpatient Encounter Medications as of 12/08/2024  Medication Sig   azelastine  (ASTELIN ) 0.1 % nasal spray Place 1 spray into both nostrils 2 (two) times daily. Use in each nostril as directed   Calcium  Carbonate-Vitamin D3 600-400 MG-UNIT TABS Take 1 mg by mouth daily.   cephALEXin  (KEFLEX ) 500 MG capsule Take 1 capsule (500 mg total) by mouth 3 (three) times daily.   cetirizine (ZYRTEC) 10 MG tablet Take by mouth.   [employer] FIBER GUMMIES PO Take by mouth.   ferrous sulfate 325 (65 FE) MG tablet Take 325 mg by mouth daily with breakfast.   LOMAIRA 8 MG TABS Take by mouth.   pantoprazole  (PROTONIX ) 40 MG tablet Take 40 mg by mouth.    topiramate (TOPAMAX) 50 MG tablet Take 50 mg by mouth daily.   tranexamic acid  (LYSTEDA ) 650 MG TABS tablet TAKE 2 TABLETS (1,300 MG TOTAL) BY MOUTH 3 (THREE) TIMES DAILY. AS NEEDED.   [DISCONTINUED] mupirocin  ointment (BACTROBAN ) 2 % Apply 1 Application topically 2 (two) times daily.   mupirocin  ointment (BACTROBAN ) 2 % Apply 1 Application topically 2 (two) times daily.   [DISCONTINUED] phentermine 37.5 MG capsule Take 37.5 mg by mouth every morning.   No facility-administered encounter medications on file as of 12/08/2024.     Lab Results  Component Value Date   WBC 5.0 12/07/2024   HGB 12.8 12/07/2024   HCT 39.1 12/07/2024   PLT 203 12/07/2024   GLUCOSE 102 (H) 12/07/2024   CHOL 131 05/16/2024   TRIG 44.0 05/16/2024   HDL 55.70 05/16/2024   LDLCALC 67 05/16/2024   ALT 40 06/18/2024   AST 27 06/18/2024   NA 140 12/07/2024   K 4.1 12/07/2024   CL 110 12/07/2024   CREATININE 0.74 12/07/2024   BUN 14 12/07/2024   CO2 21 (L) 12/07/2024   TSH 1.54 01/28/2024   HGBA1C 5.0 03/03/2022    MM 3D SCREENING MAMMOGRAM BILATERAL BREAST Result Date: 09/02/2024 CLINICAL DATA:  Screening. EXAM: DIGITAL SCREENING BILATERAL MAMMOGRAM WITH TOMOSYNTHESIS AND CAD TECHNIQUE: Bilateral screening digital craniocaudal and mediolateral oblique mammograms were obtained. Bilateral screening digital breast tomosynthesis was performed. The images were evaluated with computer-aided detection. COMPARISON:  Previous exam(s). ACR Breast Density Category b: There are scattered areas of fibroglandular density. FINDINGS: There are no findings suspicious for malignancy.  IMPRESSION: No mammographic evidence of malignancy. A result letter of this screening mammogram will be mailed directly to the patient. RECOMMENDATION: Screening mammogram in one year. (Code:SM-B-01Y) BI-RADS CATEGORY  1: Negative. Electronically Signed   By: Alm Parkins M.D.   On: 09/02/2024 14:31       Assessment & Plan:  Stress Assessment &  Plan: Increased stress as outlined. Discussed. She does not feel needs further intervention at this time. Follow.    Hypertension, essential Assessment & Plan: Blood pressure as outlined. On no medication. Follow pressures.   Orders: -     Basic metabolic panel with GFR; Future -     Lipid panel; Future -     Hepatic function panel; Future  Menstrual irregularity Assessment & Plan: Has seen gyn. Had recommended lysteda  as directed. Periods as outlined. Discussed. Refer back to gyn for evaluation/further w/up and question of need for continuing lysteda .   Orders: -     Ambulatory referral to Gynecology  Iron  deficiency anemia, unspecified iron  deficiency anemia type Assessment & Plan: History of bariatric surgery.   Seeing Dr. Brahmanday/hematology.  Had f/u with Dr Rennie 08/05/24 - hgb 12.5. recommended venofer  -  for total of 6.  received iron  infustions. Hgb yesterday 12.8 with ferritin 135.   Gastroesophageal reflux disease without esophagitis Assessment & Plan: She is status post duodenal switch.  Is status post hiatal hernia repair February 2022.  Followed by Dr. Wolm.  Continue protonix .    Other orders -     Cephalexin ; Take 1 capsule (500 mg total) by mouth 3 (three) times daily.  Dispense: 21 capsule; Refill: 0 -     Mupirocin ; Apply 1 Application topically 2 (two) times daily.  Dispense: 22 g; Refill: 0     Allena Hamilton, MD "

## 2024-12-09 ENCOUNTER — Inpatient Hospital Stay

## 2024-12-09 ENCOUNTER — Encounter: Payer: Self-pay | Admitting: Nurse Practitioner

## 2024-12-09 ENCOUNTER — Inpatient Hospital Stay: Admitting: Nurse Practitioner

## 2024-12-09 VITALS — BP 130/77 | HR 84 | Temp 97.6°F | Resp 20 | Wt 217.5 lb

## 2024-12-09 VITALS — BP 124/88 | HR 85 | Temp 97.5°F | Resp 19

## 2024-12-09 DIAGNOSIS — D500 Iron deficiency anemia secondary to blood loss (chronic): Secondary | ICD-10-CM | POA: Diagnosis not present

## 2024-12-09 DIAGNOSIS — D509 Iron deficiency anemia, unspecified: Secondary | ICD-10-CM

## 2024-12-09 DIAGNOSIS — E611 Iron deficiency: Secondary | ICD-10-CM

## 2024-12-09 MED ORDER — IRON SUCROSE 20 MG/ML IV SOLN
200.0000 mg | Freq: Once | INTRAVENOUS | Status: AC
  Administered 2024-12-09: 200 mg via INTRAVENOUS
  Filled 2024-12-09: qty 10

## 2024-12-09 MED ORDER — SODIUM CHLORIDE 0.9% FLUSH
10.0000 mL | Freq: Once | INTRAVENOUS | Status: AC | PRN
  Administered 2024-12-09: 10 mL
  Filled 2024-12-09: qty 10

## 2024-12-09 NOTE — Progress Notes (Unsigned)
 [facility] CONSULT NOTE  Patient Care Team: Glendia Shad, MD as PCP - General (Internal Medicine) Rennie Cindy SAUNDERS, MD as Consulting Physician (Oncology)  CHIEF COMPLAINTS/PURPOSE OF CONSULTATION:    HEMATOLOGY HISTORY  # IRON  DEF ANEMIA -malabsorption /heavy menstrual cycles. 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  Also capsule study [last as per patient].   #Chronic fatigue- Dx: OSA; intolerant to CPAP; 2022-sleep study negative.    HISTORY OF PRESENTING ILLNESS: Alone.  Ambulating independently.  Erica Hernandez 49 y.o.  female with iron  deficiency sec to malabsorption/duodenal switch/heavy menstrual cycles is here for follow-up.  Patient has been on intermittent iron  infusions. Complains of ongoing fatigue.  No nausea vomiting.  Patient denies any blood in stools or black-colored stools.  Patient denies any nausea vomiting abdominal pain.  Erica Hernandez reports that her menstrual cycle has become irregular.  Erica Hernandez has not had one since dec.  Erica Hernandez is going to follow up with OBGYN soon regarding her irregular heavy menstrual cycles.  Review of Systems  Constitutional:  Positive for malaise/fatigue and weight loss (Intentional/gastric bypass). Negative for chills, diaphoresis and fever.  HENT:  Negative for nosebleeds and sore throat.   Eyes:  Negative for double vision.  Respiratory:  Negative for cough, hemoptysis, sputum production, shortness of breath and wheezing.   Cardiovascular:  Negative for chest pain, palpitations, orthopnea and leg swelling.  Gastrointestinal:  Negative for abdominal pain, blood in stool, constipation, diarrhea, heartburn, melena, nausea and vomiting.  Genitourinary:  Negative for dysuria, frequency and urgency.  Musculoskeletal:  Negative for back pain and joint pain.  Skin:  Negative.  Negative for itching and rash.  Neurological:  Negative for dizziness, tingling, focal weakness, weakness and headaches.  Endo/Heme/Allergies:  Does not bruise/bleed easily.  Psychiatric/Behavioral:  Negative for depression. The patient is not nervous/anxious and does not have insomnia.     MEDICAL HISTORY:  Past Medical History:  Diagnosis Date   Allergy    Hypertension    during pregnancy    SURGICAL HISTORY: Past Surgical History:  Procedure Laterality Date   CHOLECYSTECTOMY     COLONOSCOPY N/A 09/26/2022   Procedure: COLONOSCOPY;  Surgeon: Jinny Carmine, MD;  Location: [facility];  Service: Endoscopy;  Laterality: N/A;   ESOPHAGOGASTRODUODENOSCOPY N/A 09/26/2022   Procedure: ESOPHAGOGASTRODUODENOSCOPY (EGD);  Surgeon: Jinny Carmine, MD;  Location: [facility];  Service: Endoscopy;  Laterality: N/A;   GIVENS CAPSULE STUDY N/A 11/06/2022   Procedure: GIVENS CAPSULE STUDY;  Surgeon: Jinny Carmine, MD;  Location: [facility] END[facility];  Service: Endoscopy;  Laterality: N/A;   HERNIA REPAIR     SHOULDER SURGERY  03/30/2011   TONSILLECTOMY  1984    SOCIAL HISTORY: Social History   Socioeconomic History   Marital status: Married    Spouse name: Not on file   Number of children: 1   Years of education: Not on file   Highest education level: 12th grade  Occupational History    Employer: [employer]  Tobacco Use   Smoking status: Never   Smokeless tobacco: Never  Vaping Use   Vaping status: Never Used  Substance and Sexual Activity   Alcohol use: No   Drug use: No   Sexual activity: Yes    Birth control/protection: Condom  Other Topics Concern   Not on file  Social History Narrative  Lives in [place]; diplomatic services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    Social Drivers of Health   Tobacco Use: Low Risk (12/10/2024)   Patient History    Smoking Tobacco Use: Never    Smokeless Tobacco Use: Never    Passive Exposure: Not on file  Financial Resource  Strain: Low Risk (09/23/2024)   Overall Financial Resource Strain (CARDIA)    Difficulty of Paying Living Expenses: Not hard at all  Food Insecurity: No Food Insecurity (09/23/2024)   Epic    Worried About Programme Researcher, Broadcasting/film/video in the Last Year: Never true    Ran Out of Food in the Last Year: Never true  Transportation Needs: No Transportation Needs (09/23/2024)   Epic    Lack of Transportation (Medical): No    Lack of Transportation (Non-Medical): No  Physical Activity: Insufficiently Active (09/23/2024)   Exercise Vital Sign    Days of Exercise per Week: 2 days    Minutes of Exercise per Session: 20 min  Stress: No Stress Concern Present (09/23/2024)   [employer] of Occupational Health - Occupational Stress Questionnaire    Feeling of Stress: Only a little  Social Connections: Moderately Integrated (09/23/2024)   Social Connection and Isolation Panel    Frequency of Communication with Friends and Family: More than three times a week    Frequency of Social Gatherings with Friends and Family: Twice a week    Attends Religious Services: 1 to 4 times per year    Active Member of [employer] or Organizations: No    Attends Engineer, Structural: Not on file    Marital Status: Married  Catering Manager Violence: Not on file  Depression (PHQ2-9): Low Risk (12/09/2024)   Depression (PHQ2-9)    PHQ-2 Score: 0  Alcohol Screen: Not on file  Housing: Low Risk (09/23/2024)   Epic    Unable to Pay for Housing in the Last Year: No    Number of Times Moved in the Last Year: 0    Homeless in the Last Year: No  Utilities: Not on file  Health Literacy: Not on file    FAMILY HISTORY: Family History  Problem Relation Age of Onset   Hypertension Father    Diabetes Father    Obesity Father    Breast cancer Maternal Grandmother        45-70   Breast cancer Paternal Grandmother        76-70   Cancer Paternal Grandmother    Diabetes Other        parent   COPD Paternal Grandfather     Colon cancer Neg Hx     ALLERGIES:  is allergic to sulfa antibiotics.  MEDICATIONS:  Current Outpatient Medications  Medication Sig Dispense Refill   azelastine  (ASTELIN ) 0.1 % nasal spray Place 1 spray into both nostrils 2 (two) times daily. Use in each nostril as directed 30 mL 1   Calcium  Carbonate-Vitamin D3 600-400 MG-UNIT TABS Take 1 mg by mouth daily.     cephALEXin  (KEFLEX ) 500 MG capsule Take 1 capsule (500 mg total) by mouth 3 (three) times daily. 21 capsule 0   cetirizine (ZYRTEC) 10 MG tablet Take by mouth.     [employer] FIBER GUMMIES PO Take by mouth.     ferrous sulfate 325 (65 FE) MG tablet Take 325 mg by mouth daily with breakfast.     LOMAIRA 8 MG TABS Take by mouth.     mupirocin  ointment (BACTROBAN ) 2 % Apply 1 Application topically  2 (two) times daily. 22 g 0   pantoprazole  (PROTONIX ) 40 MG tablet Take 40 mg by mouth.     topiramate (TOPAMAX) 50 MG tablet Take 50 mg by mouth daily.     tranexamic acid  (LYSTEDA ) 650 MG TABS tablet TAKE 2 TABLETS (1,300 MG TOTAL) BY MOUTH 3 (THREE) TIMES DAILY. AS NEEDED. 30 tablet 1   No current facility-administered medications for this visit.      PHYSICAL EXAMINATION:   Vitals:   12/09/24 1432  BP: 130/77  Pulse: 84  Resp: 20  Temp: 97.6 F (36.4 C)  SpO2: 100%   Filed Weights   12/09/24 1432  Weight: 217 lb 8 oz (98.7 kg)    Physical Exam HENT:     Head: Normocephalic and atraumatic.     Mouth/Throat:     Pharynx: No oropharyngeal exudate.  Eyes:     Pupils: Pupils are equal, round, and reactive to light.  Cardiovascular:     Rate and Rhythm: Normal rate and regular rhythm.  Pulmonary:     Effort: Pulmonary effort is normal. No respiratory distress.     Breath sounds: Normal breath sounds. No wheezing.  Abdominal:     General: Bowel sounds are normal. There is no distension.     Palpations: Abdomen is soft. There is no mass.     Tenderness: There is no abdominal tenderness. There is no guarding or rebound.   Musculoskeletal:        General: No tenderness. Normal range of motion.     Cervical back: Normal range of motion and neck supple.  Skin:    General: Skin is warm.  Neurological:     Mental Status: Erica Hernandez is alert and oriented to person, place, and time.  Psychiatric:        Mood and Affect: Affect normal.     LABORATORY DATA:  I have reviewed the data as listed Lab Results  Component Value Date   WBC 5.0 12/07/2024   HGB 12.8 12/07/2024   HCT 39.1 12/07/2024   MCV 94.9 12/07/2024   PLT 203 12/07/2024   Lab Results  Component Value Date   IRON  43 12/07/2024   TIBC 311 12/07/2024   FERRITIN 135 12/07/2024    Recent Labs    01/28/24 0953 05/16/24 0834 06/18/24 0837 07/29/24 1516 12/07/24 1410  NA 140 140 137 135 140  K 4.3 3.9 4.4 4.1 4.1  CL 108 109 107 106 110  CO2 26 25 22 24  21*  GLUCOSE 90 82 71 86 102*  BUN 13 12 18 15 14   CREATININE 0.80 0.74 0.86 0.80 0.74  CALCIUM  9.2 8.7 9.1 8.8* 9.2  GFRNONAA  --   --  >60 >60 >60  PROT 6.5 6.2 7.1  --   --   ALBUMIN 4.2 4.1 4.0  --   --   AST 19 21 27   --   --   ALT 24 26 40  --   --   ALKPHOS 92 88 93  --   --   BILITOT 0.4 0.5 0.9  --   --   BILIDIR 0.1 0.1  --   --   --     Assessment and plan:  Iron  deficiency #Iron  deficiency anemia-multifactorial/malabsorption-bariatric surgery; heavy menstrual cycles. S/p IV iron . On Bariatric advantage MVT.    # Today hemoglobin is 12.8  however patient is symptomatic and heavy menstrual cycles-with last one being in Dec-  ferritin- 135 -  proceed with Venofer  today  once   # B 12 deficiency: ELEVATED- > 2000 [on MVT Bariatric advantage]- dicussed that B12 is water  soluble.    # DISPOSITION: Proceed with venofer  today F/U in 8 weeks labs 2-3 days prior cbc, ferritin, iron  and tibc see md/np possible venofer  LP    All questions were answered. The patient knows to call the clinic with any problems, questions or concerns.   Morna Husband, NP 12/09/24 4:09 PM

## 2024-12-10 ENCOUNTER — Encounter: Payer: Self-pay | Admitting: Internal Medicine

## 2024-12-10 NOTE — Assessment & Plan Note (Signed)
Blood pressure as outlined.  On no medication.  Follow pressures.   

## 2024-12-10 NOTE — Assessment & Plan Note (Signed)
 History of bariatric surgery.   Seeing Dr. Brahmanday/hematology.  Had f/u with Dr Rennie 08/05/24 - hgb 12.5. recommended venofer  -  for total of 6.  received iron  infustions. Hgb yesterday 12.8 with ferritin 135.

## 2024-12-10 NOTE — Assessment & Plan Note (Signed)
 She is status post duodenal switch.  Is status post hiatal hernia repair February 2022.  Followed by Dr. Wolm.  Continue protonix .

## 2024-12-10 NOTE — Assessment & Plan Note (Signed)
 Increased stress as outlined. Discussed. She does not feel needs further intervention at this time. Follow.

## 2024-12-10 NOTE — Assessment & Plan Note (Signed)
 Has seen gyn. Had recommended lysteda  as directed. Periods as outlined. Discussed. Refer back to gyn for evaluation/further w/up and question of need for continuing lysteda .

## 2024-12-12 ENCOUNTER — Encounter: Payer: Self-pay | Admitting: Internal Medicine

## 2024-12-23 ENCOUNTER — Other Ambulatory Visit

## 2024-12-28 ENCOUNTER — Ambulatory Visit: Admitting: Internal Medicine

## 2024-12-30 ENCOUNTER — Ambulatory Visit: Admitting: Nurse Practitioner

## 2024-12-30 ENCOUNTER — Ambulatory Visit

## 2025-01-27 ENCOUNTER — Inpatient Hospital Stay

## 2025-01-31 ENCOUNTER — Inpatient Hospital Stay: Admitting: Nurse Practitioner

## 2025-01-31 ENCOUNTER — Inpatient Hospital Stay

## 2025-03-08 ENCOUNTER — Ambulatory Visit: Admitting: Internal Medicine
